# Patient Record
Sex: Female | Born: 1980
Health system: Southern US, Community
[De-identification: ages and names within clinical notes are randomized; demographics above are authoritative.]

## PROBLEM LIST (undated history)

## (undated) DIAGNOSIS — N390 Urinary tract infection, site not specified: Secondary | ICD-10-CM

## (undated) DIAGNOSIS — A749 Chlamydial infection, unspecified: Secondary | ICD-10-CM

## (undated) DIAGNOSIS — B009 Herpesviral infection, unspecified: Secondary | ICD-10-CM

## (undated) DIAGNOSIS — IMO0002 Reserved for concepts with insufficient information to code with codable children: Secondary | ICD-10-CM

## (undated) DIAGNOSIS — B977 Papillomavirus as the cause of diseases classified elsewhere: Secondary | ICD-10-CM

## (undated) DIAGNOSIS — A549 Gonococcal infection, unspecified: Secondary | ICD-10-CM

## (undated) DIAGNOSIS — A599 Trichomoniasis, unspecified: Secondary | ICD-10-CM

## (undated) DIAGNOSIS — R87619 Unspecified abnormal cytological findings in specimens from cervix uteri: Secondary | ICD-10-CM

## (undated) DIAGNOSIS — R51 Headache: Secondary | ICD-10-CM

## (undated) DIAGNOSIS — D649 Anemia, unspecified: Secondary | ICD-10-CM

## (undated) HISTORY — DX: Herpesviral infection, unspecified: B00.9

## (undated) HISTORY — PX: LEEP: SHX91

---

## 2002-02-21 ENCOUNTER — Ambulatory Visit (HOSPITAL_COMMUNITY): Admission: RE | Admit: 2002-02-21 | Discharge: 2002-02-21 | Payer: Self-pay

## 2002-05-04 ENCOUNTER — Encounter: Admission: RE | Admit: 2002-05-04 | Discharge: 2002-05-04 | Payer: Self-pay | Admitting: Obstetrics and Gynecology

## 2002-07-30 ENCOUNTER — Inpatient Hospital Stay (HOSPITAL_COMMUNITY): Admission: AD | Admit: 2002-07-30 | Discharge: 2002-07-31 | Payer: Self-pay | Admitting: *Deleted

## 2003-03-05 ENCOUNTER — Emergency Department (HOSPITAL_COMMUNITY): Admission: EM | Admit: 2003-03-05 | Discharge: 2003-03-05 | Payer: Self-pay | Admitting: Emergency Medicine

## 2003-03-05 ENCOUNTER — Encounter: Payer: Self-pay | Admitting: Emergency Medicine

## 2003-04-26 ENCOUNTER — Encounter: Admission: RE | Admit: 2003-04-26 | Discharge: 2003-04-26 | Payer: Self-pay | Admitting: Obstetrics and Gynecology

## 2003-05-28 ENCOUNTER — Ambulatory Visit (HOSPITAL_COMMUNITY): Admission: RE | Admit: 2003-05-28 | Discharge: 2003-05-28 | Payer: Self-pay | Admitting: Obstetrics and Gynecology

## 2003-05-28 ENCOUNTER — Encounter (INDEPENDENT_AMBULATORY_CARE_PROVIDER_SITE_OTHER): Payer: Self-pay

## 2003-06-28 ENCOUNTER — Encounter: Admission: RE | Admit: 2003-06-28 | Discharge: 2003-06-28 | Payer: Self-pay | Admitting: Obstetrics and Gynecology

## 2003-07-23 ENCOUNTER — Encounter: Admission: RE | Admit: 2003-07-23 | Discharge: 2003-07-23 | Payer: Self-pay | Admitting: Obstetrics and Gynecology

## 2003-10-12 ENCOUNTER — Encounter: Admission: RE | Admit: 2003-10-12 | Discharge: 2003-10-12 | Payer: Self-pay | Admitting: Obstetrics and Gynecology

## 2003-12-31 ENCOUNTER — Encounter: Admission: RE | Admit: 2003-12-31 | Discharge: 2003-12-31 | Payer: Self-pay | Admitting: *Deleted

## 2004-03-18 ENCOUNTER — Encounter: Admission: RE | Admit: 2004-03-18 | Discharge: 2004-03-18 | Payer: Self-pay | Admitting: Obstetrics & Gynecology

## 2004-06-17 ENCOUNTER — Encounter: Admission: RE | Admit: 2004-06-17 | Discharge: 2004-06-17 | Payer: Self-pay | Admitting: Obstetrics and Gynecology

## 2004-09-08 ENCOUNTER — Ambulatory Visit: Payer: Self-pay | Admitting: Obstetrics and Gynecology

## 2004-10-24 ENCOUNTER — Inpatient Hospital Stay (HOSPITAL_COMMUNITY): Admission: AD | Admit: 2004-10-24 | Discharge: 2004-10-24 | Payer: Self-pay | Admitting: Obstetrics & Gynecology

## 2004-12-04 ENCOUNTER — Ambulatory Visit: Payer: Self-pay | Admitting: Family Medicine

## 2004-12-04 ENCOUNTER — Encounter: Payer: Self-pay | Admitting: Family Medicine

## 2004-12-04 ENCOUNTER — Encounter (INDEPENDENT_AMBULATORY_CARE_PROVIDER_SITE_OTHER): Payer: Self-pay | Admitting: *Deleted

## 2004-12-04 ENCOUNTER — Other Ambulatory Visit: Admission: RE | Admit: 2004-12-04 | Discharge: 2004-12-04 | Payer: Self-pay | Admitting: Family Medicine

## 2005-01-11 ENCOUNTER — Emergency Department (HOSPITAL_COMMUNITY): Admission: EM | Admit: 2005-01-11 | Discharge: 2005-01-11 | Payer: Self-pay | Admitting: *Deleted

## 2005-02-17 ENCOUNTER — Ambulatory Visit: Payer: Self-pay | Admitting: Obstetrics and Gynecology

## 2005-03-14 ENCOUNTER — Emergency Department (HOSPITAL_COMMUNITY): Admission: EM | Admit: 2005-03-14 | Discharge: 2005-03-14 | Payer: Self-pay | Admitting: Family Medicine

## 2005-03-27 ENCOUNTER — Inpatient Hospital Stay (HOSPITAL_COMMUNITY): Admission: AD | Admit: 2005-03-27 | Discharge: 2005-03-27 | Payer: Self-pay | Admitting: Obstetrics & Gynecology

## 2005-04-29 ENCOUNTER — Inpatient Hospital Stay (HOSPITAL_COMMUNITY): Admission: AD | Admit: 2005-04-29 | Discharge: 2005-04-29 | Payer: Self-pay | Admitting: Obstetrics and Gynecology

## 2005-05-05 ENCOUNTER — Inpatient Hospital Stay (HOSPITAL_COMMUNITY): Admission: AD | Admit: 2005-05-05 | Discharge: 2005-05-05 | Payer: Self-pay | Admitting: *Deleted

## 2005-05-05 ENCOUNTER — Ambulatory Visit: Payer: Self-pay | Admitting: *Deleted

## 2006-09-01 ENCOUNTER — Emergency Department (HOSPITAL_COMMUNITY): Admission: EM | Admit: 2006-09-01 | Discharge: 2006-09-01 | Payer: Self-pay | Admitting: Emergency Medicine

## 2007-06-17 ENCOUNTER — Emergency Department (HOSPITAL_COMMUNITY): Admission: EM | Admit: 2007-06-17 | Discharge: 2007-06-17 | Payer: Self-pay | Admitting: Emergency Medicine

## 2007-07-06 ENCOUNTER — Emergency Department (HOSPITAL_COMMUNITY): Admission: EM | Admit: 2007-07-06 | Discharge: 2007-07-06 | Payer: Self-pay | Admitting: Emergency Medicine

## 2007-10-29 ENCOUNTER — Inpatient Hospital Stay (HOSPITAL_COMMUNITY): Admission: AD | Admit: 2007-10-29 | Discharge: 2007-10-29 | Payer: Self-pay | Admitting: Gynecology

## 2008-02-07 ENCOUNTER — Emergency Department (HOSPITAL_COMMUNITY): Admission: EM | Admit: 2008-02-07 | Discharge: 2008-02-07 | Payer: Self-pay | Admitting: Family Medicine

## 2008-07-31 ENCOUNTER — Emergency Department (HOSPITAL_COMMUNITY): Admission: EM | Admit: 2008-07-31 | Discharge: 2008-07-31 | Payer: Self-pay | Admitting: Emergency Medicine

## 2008-10-26 HISTORY — PX: COLPOSCOPY: SHX161

## 2008-11-26 ENCOUNTER — Emergency Department (HOSPITAL_COMMUNITY): Admission: EM | Admit: 2008-11-26 | Discharge: 2008-11-26 | Payer: Self-pay | Admitting: Family Medicine

## 2008-11-30 ENCOUNTER — Inpatient Hospital Stay (HOSPITAL_COMMUNITY): Admission: AD | Admit: 2008-11-30 | Discharge: 2008-11-30 | Payer: Self-pay | Admitting: Obstetrics & Gynecology

## 2009-01-18 ENCOUNTER — Emergency Department (HOSPITAL_COMMUNITY): Admission: EM | Admit: 2009-01-18 | Discharge: 2009-01-18 | Payer: Self-pay | Admitting: Emergency Medicine

## 2009-01-20 ENCOUNTER — Emergency Department (HOSPITAL_COMMUNITY): Admission: EM | Admit: 2009-01-20 | Discharge: 2009-01-20 | Payer: Self-pay | Admitting: Emergency Medicine

## 2009-07-10 ENCOUNTER — Emergency Department (HOSPITAL_COMMUNITY): Admission: EM | Admit: 2009-07-10 | Discharge: 2009-07-10 | Payer: Self-pay | Admitting: Family Medicine

## 2010-01-21 ENCOUNTER — Emergency Department (HOSPITAL_COMMUNITY): Admission: EM | Admit: 2010-01-21 | Discharge: 2010-01-21 | Payer: Self-pay | Admitting: Emergency Medicine

## 2010-05-12 ENCOUNTER — Emergency Department (HOSPITAL_COMMUNITY): Admission: EM | Admit: 2010-05-12 | Discharge: 2010-05-12 | Payer: Self-pay | Admitting: Emergency Medicine

## 2010-08-07 ENCOUNTER — Ambulatory Visit: Payer: Self-pay | Admitting: Gynecology

## 2010-08-07 ENCOUNTER — Inpatient Hospital Stay (HOSPITAL_COMMUNITY): Admission: AD | Admit: 2010-08-07 | Discharge: 2010-08-07 | Payer: Self-pay | Admitting: Obstetrics & Gynecology

## 2010-09-04 ENCOUNTER — Emergency Department (HOSPITAL_COMMUNITY): Admission: EM | Admit: 2010-09-04 | Discharge: 2010-09-04 | Payer: Self-pay | Admitting: Emergency Medicine

## 2010-10-04 ENCOUNTER — Emergency Department (HOSPITAL_COMMUNITY)
Admission: EM | Admit: 2010-10-04 | Discharge: 2010-10-04 | Payer: Self-pay | Source: Home / Self Care | Admitting: Emergency Medicine

## 2010-12-11 ENCOUNTER — Encounter: Payer: Self-pay | Admitting: Obstetrics and Gynecology

## 2010-12-18 ENCOUNTER — Encounter: Payer: BC Managed Care – HMO | Admitting: Obstetrics and Gynecology

## 2010-12-18 ENCOUNTER — Other Ambulatory Visit (HOSPITAL_COMMUNITY)
Admission: RE | Admit: 2010-12-18 | Discharge: 2010-12-18 | Disposition: A | Discharge status: Home / Self Care | Payer: BC Managed Care – HMO | Source: Ambulatory Visit | Attending: Obstetrics and Gynecology | Admitting: Obstetrics and Gynecology

## 2010-12-18 ENCOUNTER — Other Ambulatory Visit: Payer: Self-pay | Admitting: Obstetrics and Gynecology

## 2010-12-18 ENCOUNTER — Encounter: Payer: Self-pay | Admitting: Obstetrics and Gynecology

## 2010-12-18 DIAGNOSIS — Z01419 Encounter for gynecological examination (general) (routine) without abnormal findings: Secondary | ICD-10-CM | POA: Insufficient documentation

## 2010-12-18 DIAGNOSIS — N898 Other specified noninflammatory disorders of vagina: Secondary | ICD-10-CM

## 2010-12-18 DIAGNOSIS — R1084 Generalized abdominal pain: Secondary | ICD-10-CM

## 2010-12-18 DIAGNOSIS — R350 Frequency of micturition: Secondary | ICD-10-CM

## 2010-12-18 DIAGNOSIS — Z124 Encounter for screening for malignant neoplasm of cervix: Secondary | ICD-10-CM

## 2010-12-18 LAB — CONVERTED CEMR LAB
Trich, Wet Prep: NONE SEEN
Yeast Wet Prep HPF POC: NONE SEEN

## 2010-12-24 ENCOUNTER — Other Ambulatory Visit: Payer: Self-pay | Admitting: Family Medicine

## 2010-12-31 ENCOUNTER — Ambulatory Visit (HOSPITAL_COMMUNITY): Admission: RE | Admit: 2010-12-31 | Payer: BC Managed Care – HMO | Source: Ambulatory Visit

## 2011-01-06 LAB — URINALYSIS, ROUTINE W REFLEX MICROSCOPIC
Bilirubin Urine: NEGATIVE
Glucose, UA: NEGATIVE mg/dL
Hgb urine dipstick: NEGATIVE
Ketones, ur: NEGATIVE mg/dL
Nitrite: NEGATIVE
Nitrite: NEGATIVE
Protein, ur: NEGATIVE mg/dL
Specific Gravity, Urine: 1.012 (ref 1.005–1.030)
Specific Gravity, Urine: 1.026 (ref 1.005–1.030)
Urobilinogen, UA: 2 mg/dL — ABNORMAL HIGH (ref 0.0–1.0)
Urobilinogen, UA: 2 mg/dL — ABNORMAL HIGH (ref 0.0–1.0)
pH: 7 (ref 5.0–8.0)
pH: 6 (ref 5.0–8.0)

## 2011-01-06 LAB — URINE CULTURE
Colony Count: 15000
Colony Count: >=100000
Culture  Setup Time: 201112102018

## 2011-01-06 LAB — WET PREP, GENITAL
Clue Cells Wet Prep HPF POC: NONE SEEN
Trich, Wet Prep: NONE SEEN
Yeast Wet Prep HPF POC: NONE SEEN

## 2011-01-06 LAB — GC/CHLAMYDIA PROBE AMP, GENITAL
Chlamydia, DNA Probe: NEGATIVE
GC Probe Amp, Genital: NEGATIVE

## 2011-01-06 LAB — URINE MICROSCOPIC-ADD ON

## 2011-01-06 LAB — POCT PREGNANCY, URINE: Preg Test, Ur: NEGATIVE

## 2011-01-07 LAB — URINALYSIS, ROUTINE W REFLEX MICROSCOPIC
Bilirubin Urine: NEGATIVE
Glucose, UA: NEGATIVE mg/dL
Hgb urine dipstick: NEGATIVE
Ketones, ur: NEGATIVE mg/dL
Nitrite: NEGATIVE
Protein, ur: NEGATIVE mg/dL
Specific Gravity, Urine: <1.005 — ABNORMAL LOW (ref 1.005–1.030)
Urobilinogen, UA: 1 mg/dL (ref 0.0–1.0)
pH: 6 (ref 5.0–8.0)

## 2011-01-07 LAB — WET PREP, GENITAL: Yeast Wet Prep HPF POC: NONE SEEN

## 2011-01-07 LAB — GC/CHLAMYDIA PROBE AMP, GENITAL: Chlamydia, DNA Probe: NEGATIVE

## 2011-01-08 ENCOUNTER — Other Ambulatory Visit: Payer: Self-pay | Admitting: Family Medicine

## 2011-01-08 ENCOUNTER — Encounter (INDEPENDENT_AMBULATORY_CARE_PROVIDER_SITE_OTHER): Payer: Self-pay | Admitting: *Deleted

## 2011-01-08 DIAGNOSIS — R1013 Epigastric pain: Secondary | ICD-10-CM

## 2011-01-12 ENCOUNTER — Ambulatory Visit (HOSPITAL_COMMUNITY)
Admission: RE | Admit: 2011-01-12 | Discharge: 2011-01-12 | Disposition: A | Discharge status: Home / Self Care | Payer: BC Managed Care – HMO | Source: Ambulatory Visit | Attending: Family Medicine | Admitting: Family Medicine

## 2011-01-12 DIAGNOSIS — R1013 Epigastric pain: Secondary | ICD-10-CM | POA: Insufficient documentation

## 2011-01-13 NOTE — Letter (Signed)
Summary: New Patient letter  [facility]  [address] [place], [place] [address]   Phone: [phone]  Fax: [phone]       01/08/2011 MRN: [id number]  [facility] [address] [place], [place]  [address]  [place]  Dear Ms. Graven,  Welcome to the Gastroenterology Division at [facility].    You are scheduled to see Dr.  Russella Dar on 02-19-11 at 10:15A.M. on the 3rd floor at [facility], [address].  We ask that you try to arrive at our office 15 minutes prior to your appointment time to allow for check-in.  We would like you to complete the enclosed self-administered evaluation form prior to your visit and bring it with you on the day of your appointment.  We will review it with you.  Also, please bring a complete list of all your medications or, if you prefer, bring the medication bottles and we will list them.  Please bring your insurance card so that we may make a copy of it.  If your insurance requires a referral to see a specialist, please bring your referral form from your primary care physician.  Co-payments are due at the time of your visit and may be paid by cash, check or credit card.     Your office visit will consist of a consult with your physician (includes a physical exam), any laboratory testing he/she may order, scheduling of any necessary diagnostic testing (e.g. x-ray, ultrasound, CT-scan), and scheduling of a procedure (e.g. Endoscopy, Colonoscopy) if required.  Please allow enough time on your schedule to allow for any/all of these possibilities.    If you cannot keep your appointment, please call [phone] to cancel or reschedule prior to your appointment date.  This allows [place] the opportunity to schedule an appointment for another patient in need of care.  If you do not cancel or reschedule by 5 p.m. the business day prior to your appointment date, you will be charged a $50.00 late cancellation/no-show fee.    Thank you for  choosing [facility] for your medical needs.  We appreciate the opportunity to care for you.  Please visit [place] at our website  to learn more about our practice.                     Sincerely,                                                             The Gastroenterology Division

## 2011-01-16 NOTE — Progress Notes (Unsigned)
[name], [name]             ACCOUNT NO.:  [id number]  MEDICAL RECORD NO.:  [id number]           PATIENT TYPE:  A  LOCATION:  [facility]                   FACILITY:  [facility]  PHYSICIAN:  Argentina Donovan, MD        DATE OF BIRTH:  07-Jun-1981  DATE OF SERVICE:  12/18/2010                                 CLINIC NOTE  The patient is a 30 year old African American gravida 2, para 2-0-0-2, who is referred from the emergency room for a repeat Pap smear.  In the past, [facility], and she had a LEEP done several years ago at [facility].  Her chief complaint is midepigastric pain and urinary frequency with false urge and frequent urinary tract infections.  PHYSICAL EXAMINATION:  ABDOMEN:  Soft, flat, somewhat tender in the mid upper portion area with some guarding, but no rebound.  There is no positive Murphy sign, and the pain does radiate into her back.  The lower abdomen is fine, but when I push in the suprapubic area, she says she feel like __________. PELVIC:  The external genitalia was normal.  BUS within normal limits. There is a heavy leukorrhea with a strong amine odor.  Wet prep was taken.  The vagina otherwise is clean and well rugated.  The cervix is quite scarred and flushed against the apex of the vagina.  Pap smear was taken.  The uterus was anterior of normal size, shape, and consistency, and the adnexa is normal.  IMPRESSION: 1. Bacterial vaginosis, with normal pelvic exam. 2. Recurrent urinary tract infections with false urge. 3. Epigastric pain.  I am going to put the patient on Flagyl.  I am going to refer her to gastroenterologist and to a urologist for evaluation.  I am going to get a CT of her upper abdomen with and without contrast.          ______________________________ Argentina Donovan, MD    PR/MEDQ  D:  12/18/2010  T:  12/19/2010  Job:  [id number]

## 2011-01-30 LAB — POCT URINALYSIS DIP (DEVICE)
Glucose, UA: NEGATIVE mg/dL
Nitrite: NEGATIVE
Protein, ur: NEGATIVE mg/dL
Urobilinogen, UA: 4 mg/dL — ABNORMAL HIGH (ref 0.0–1.0)

## 2011-01-30 LAB — POCT PREGNANCY, URINE: Preg Test, Ur: NEGATIVE

## 2011-01-30 LAB — GC/CHLAMYDIA PROBE AMP, GENITAL: GC Probe Amp, Genital: NEGATIVE

## 2011-02-05 LAB — COMPREHENSIVE METABOLIC PANEL
ALT: 13 U/L (ref 0–35)
AST: 19 U/L (ref 0–37)
Calcium: 9 mg/dL (ref 8.4–10.5)
Creatinine, Ser: 0.69 mg/dL (ref 0.4–1.2)
GFR calc Af Amer: >60 mL/min (ref >60)
Sodium: 140 mEq/L (ref 135–145)
Total Protein: 6.8 g/dL (ref 6.0–8.3)

## 2011-02-05 LAB — URINALYSIS, ROUTINE W REFLEX MICROSCOPIC
Glucose, UA: NEGATIVE mg/dL
Hgb urine dipstick: NEGATIVE
Nitrite: NEGATIVE
Protein, ur: NEGATIVE mg/dL
Specific Gravity, Urine: 1.016 (ref 1.005–1.030)
Specific Gravity, Urine: 1.021 (ref 1.005–1.030)
pH: 7.5 (ref 5.0–8.0)
pH: 8 (ref 5.0–8.0)

## 2011-02-05 LAB — URINE MICROSCOPIC-ADD ON

## 2011-02-05 LAB — CBC
Hemoglobin: 11.7 g/dL — ABNORMAL LOW (ref 12.0–15.0)
MCHC: 35 g/dL (ref 30.0–36.0)
MCV: 92.2 fL (ref 78.0–100.0)
RDW: 12.7 % (ref 11.5–15.5)
RDW: 12.9 % (ref 11.5–15.5)

## 2011-02-05 LAB — DIFFERENTIAL
Basophils Absolute: 0 10*3/uL (ref 0.0–0.1)
Eosinophils Absolute: 0.1 10*3/uL (ref 0.0–0.7)
Eosinophils Relative: 1 % (ref 0–5)
Lymphocytes Relative: 14 % (ref 12–46)
Lymphocytes Relative: 16 % (ref 12–46)
Lymphs Abs: 1.2 10*3/uL (ref 0.7–4.0)
Monocytes Absolute: 0.2 10*3/uL (ref 0.1–1.0)
Monocytes Relative: 3 % (ref 3–12)
Neutro Abs: 6 10*3/uL (ref 1.7–7.7)
Neutrophils Relative %: 79 % — ABNORMAL HIGH (ref 43–77)
Neutrophils Relative %: 82 % — ABNORMAL HIGH (ref 43–77)

## 2011-02-05 LAB — GC/CHLAMYDIA PROBE AMP, GENITAL: GC Probe Amp, Genital: POSITIVE — AB

## 2011-02-05 LAB — BASIC METABOLIC PANEL
Calcium: 8.6 mg/dL (ref 8.4–10.5)
GFR calc Af Amer: >60 mL/min (ref >60)
GFR calc non Af Amer: >60 mL/min (ref >60)
Glucose, Bld: 90 mg/dL (ref 70–99)
Sodium: 137 mEq/L (ref 135–145)

## 2011-02-05 LAB — WET PREP, GENITAL: Clue Cells Wet Prep HPF POC: NONE SEEN

## 2011-02-05 LAB — LIPASE, BLOOD: Lipase: 22 U/L (ref 11–59)

## 2011-02-10 LAB — COMPREHENSIVE METABOLIC PANEL
Albumin: 4 g/dL (ref 3.5–5.2)
Alkaline Phosphatase: 49 U/L (ref 39–117)
BUN: 5 mg/dL — ABNORMAL LOW (ref 6–23)
CO2: 27 mEq/L (ref 19–32)
Chloride: 103 mEq/L (ref 96–112)
Creatinine, Ser: 0.76 mg/dL (ref 0.4–1.2)
GFR calc non Af Amer: >60 mL/min (ref >60)
Glucose, Bld: 92 mg/dL (ref 70–99)
Potassium: 3.9 mEq/L (ref 3.5–5.1)
Total Bilirubin: 0.6 mg/dL (ref 0.3–1.2)

## 2011-02-10 LAB — POCT URINALYSIS DIP (DEVICE)
Glucose, UA: NEGATIVE mg/dL
Nitrite: NEGATIVE
Protein, ur: >=300 mg/dL — AB
Specific Gravity, Urine: 1.025 (ref 1.005–1.030)
Urobilinogen, UA: 4 mg/dL — ABNORMAL HIGH (ref 0.0–1.0)

## 2011-02-10 LAB — POCT PREGNANCY, URINE
Preg Test, Ur: NEGATIVE
Preg Test, Ur: POSITIVE

## 2011-02-10 LAB — CBC
HCT: 38.3 % (ref 36.0–46.0)
Hemoglobin: 13 g/dL (ref 12.0–15.0)
MCV: 92 fL (ref 78.0–100.0)
RBC: 4.16 MIL/uL (ref 3.87–5.11)
WBC: 2.9 10*3/uL — ABNORMAL LOW (ref 4.0–10.5)

## 2011-02-10 LAB — WET PREP, GENITAL: Yeast Wet Prep HPF POC: NONE SEEN

## 2011-02-10 LAB — URINE MICROSCOPIC-ADD ON

## 2011-02-10 LAB — URINALYSIS, ROUTINE W REFLEX MICROSCOPIC
Bilirubin Urine: NEGATIVE
Nitrite: NEGATIVE
Specific Gravity, Urine: 1.025 (ref 1.005–1.030)
Urobilinogen, UA: 1 mg/dL (ref 0.0–1.0)
pH: 6 (ref 5.0–8.0)

## 2011-02-10 LAB — HCG, SERUM, QUALITATIVE: Preg, Serum: NEGATIVE

## 2011-02-19 ENCOUNTER — Ambulatory Visit: Payer: BC Managed Care – HMO | Admitting: Gastroenterology

## 2011-03-13 ENCOUNTER — Inpatient Hospital Stay (HOSPITAL_COMMUNITY)
Admission: AD | Admit: 2011-03-13 | Discharge: 2011-03-13 | Disposition: A | Discharge status: Home / Self Care | Payer: BC Managed Care – HMO | Source: Ambulatory Visit | Attending: Family Medicine | Admitting: Family Medicine

## 2011-03-13 ENCOUNTER — Inpatient Hospital Stay (HOSPITAL_COMMUNITY): Payer: BC Managed Care – HMO

## 2011-03-13 DIAGNOSIS — N883 Incompetence of cervix uteri: Secondary | ICD-10-CM

## 2011-03-13 DIAGNOSIS — O99891 Other specified diseases and conditions complicating pregnancy: Secondary | ICD-10-CM

## 2011-03-13 DIAGNOSIS — R109 Unspecified abdominal pain: Secondary | ICD-10-CM

## 2011-03-13 DIAGNOSIS — O9989 Other specified diseases and conditions complicating pregnancy, childbirth and the puerperium: Secondary | ICD-10-CM

## 2011-03-13 LAB — URINALYSIS, ROUTINE W REFLEX MICROSCOPIC
Glucose, UA: 100 mg/dL — AB
Hgb urine dipstick: NEGATIVE
Ketones, ur: 15 mg/dL — AB
Protein, ur: NEGATIVE mg/dL
Urobilinogen, UA: 2 mg/dL — ABNORMAL HIGH (ref 0.0–1.0)

## 2011-03-13 LAB — COMPREHENSIVE METABOLIC PANEL
Alkaline Phosphatase: 41 U/L (ref 39–117)
BUN: 8 mg/dL (ref 6–23)
CO2: 24 mEq/L (ref 19–32)
Chloride: 99 mEq/L (ref 96–112)
Creatinine, Ser: 0.48 mg/dL (ref 0.4–1.2)
GFR calc non Af Amer: >60 mL/min (ref >60)
Glucose, Bld: 113 mg/dL — ABNORMAL HIGH (ref 70–99)
Potassium: 3.8 mEq/L (ref 3.5–5.1)
Total Bilirubin: 0.3 mg/dL (ref 0.3–1.2)

## 2011-03-13 LAB — CBC
MCH: 30.3 pg (ref 26.0–34.0)
MCV: 90.4 fL (ref 78.0–100.0)
Platelets: 194 10*3/uL (ref 150–400)
RBC: 3.43 MIL/uL — ABNORMAL LOW (ref 3.87–5.11)
RDW: 12.3 % (ref 11.5–15.5)

## 2011-03-13 LAB — WET PREP, GENITAL: Yeast Wet Prep HPF POC: NONE SEEN

## 2011-03-13 NOTE — Group Therapy Note (Signed)
   [name], [name]                       ACCOUNT NO.:  [id number]   MEDICAL RECORD NO.:  [id number]                   PATIENT TYPE:  OUT   LOCATION:  [facility]                           FACILITY:  [facility]   PHYSICIAN:  Rosemarie Ax, MD                DATE OF BIRTH:  06/06/1981   DATE OF SERVICE:  06/28/2003                                    CLINIC NOTE   CHIEF COMPLAINT:  Follow up after cold knife conization.   SUBJECTIVE:  A 30 year old African-American female who presents for a  routine follow up after a cold knife conization of her cervix May 28, 2003.  She had had a colposcopy which showed CIN-3 with glandular  involvement and the extent of the lesion could not be visualized so she  underwent a cold knife conization.  She reports that since the procedure she  has been feeling well, no abnormal pain or bleeding.  She is on Depo-Provera  and does not menstruate.  She states that she is having her usual normal  vaginal discharge.   OBJECTIVE:  VITAL SIGNS:  Stable.  GENERAL:  A well-developed, well-nourished African-American female.  PELVIC:  Exam with speculum shows a normal external female genitalia, normal-  appearing vaginal mucosa, moderate white to yellow thin discharge in the  vaginal vault.  Cervix appears consistent with recent cold knife conization  with a small amount of granulation tissue.   PATHOLOGY:  Surgical pathology May 28, 2003 from cold knife conization  reveals severe dysplasia, CIN-3, margins free, and no invasive disease  identified.   ASSESSMENT AND PLAN:  Status post cold knife conization with surgical  pathology as above.  The patient was informed of the results of the  procedure and that her cervix appears to be healing normally.  We discussed  that she will follow up in 4-6 months for a repeat Pap smear then have  another Pap smear six months after that, and if these are both normal she  can return to having annual Pap  smears.                                               Rosemarie Ax, MD    NR/MEDQ  D:  06/28/2003  T:  06/28/2003  Job:  [id number]

## 2011-03-13 NOTE — Group Therapy Note (Signed)
[name], [name]             ACCOUNT NO.:  [id number]   MEDICAL RECORD NO.:  [id number]          PATIENT TYPE:  WOC   LOCATION:  [facility]                   FACILITY:  [facility]   PHYSICIAN:  Tinnie Gens, MD        DATE OF BIRTH:  1981-08-04   DATE OF SERVICE:  12/04/2004                                    CLINIC NOTE   CHIEF COMPLAINT:  Follow-up.   HISTORY OF PRESENT ILLNESS:  The patient is a 30 year old with history of  cold knife cone for CIN-3 whose last Pap smear was in May 2005.  Her cold  knife cone was in August 2004.  Last Pap was normal.  She comes in today  with bleeding after Depo.  Her last Depo was in November.  She usually has  amenorrhea with her Depo.  She is requesting a new birth control method  today.   The patient has been seen in the MAU recently and diagnosed with a bacterial  vaginosis which she thinks may be causing her bleeding.   PHYSICAL EXAMINATION TODAY:  VITAL SIGNS:  As noted in the chart.  GENERAL:  She is a well-developed, well-nourished black female in no acute  distress.  ABDOMEN:  Soft, nontender, nondistended.  GENITOURINARY:  Shows normal external female genitalia.  The vagina is pink  and rugated.  The cervix seems very short as well as having a large probable  endoglandular area that is visible.  There is some blood in the vault.  Wet  prep, Pap smear, and cervical biopsy of the abnormal appearance were taken  without difficulty.  Bleeding was controlled with silver nitrate.   IMPRESSION:  1.  Abnormal bleeding.  2.  History of abnormal Pap with not quite adequate follow-up.   PLAN:  1.  Pap smear today.  2.  Check biopsy results.  3.  Will start her on Nordette one p.o. daily one pack refill p.r.n. for a      year.  4.  The patient will return in 2 weeks for biopsy results.  Also, she will      return with problems with the pill.      TP/MEDQ  D:  12/04/2004  T:  12/04/2004  Job:  [id number]

## 2011-03-13 NOTE — Group Therapy Note (Signed)
   [name], [name]                         ACCOUNT NO.:  [id number]   MEDICAL RECORD NO.:  [id number]                   PATIENT TYPE:  OUT   LOCATION:  [facility]                           FACILITY:  [facility]   PHYSICIAN:  Argentina Donovan, M.D.                   DATE OF BIRTH:  December 13, 1980   DATE OF SERVICE:  04/26/2003                                    CLINIC NOTE   HISTORY OF PRESENT ILLNESS:  A 30 year old black female who [facility].  On evaluating patient's  history she is a para 2-0-0-2 with a baby born eight months ago and during  her pregnancy she was checked for diabetes and HIV.  She had a recent  colposcopy which showed CIN III with glandular involvement and the extent of  the lesion could not be visualized.   PAST MEDICAL HISTORY:  She had a LEEP prior to this baby's delivery in 2002  for CIN III.   SOCIAL HISTORY:  The patient is a nonsmoker.   FAMILY HISTORY:  Diabetes and hypertension.   PAST SURGICAL HISTORY:  Has never had any surgery outside of the LEEP.   We are going to schedule her for a cold knife conization of the cervix.  We  discussed with her the risks of her getting pregnant again after that with  the possibility of premature cervical dilatation, cervical stenosis, and  possible necessity of urgent cesarean section and might end up being in bed  for several months prior to delivery of the baby.   IMPRESSION:  CIN III with glandular involvement, severe cervical dysplasia,  recurrent.  Schedule for cold knife conization of the cervix.                                               Argentina Donovan, M.D.    PR/MEDQ  D:  04/26/2003  T:  04/27/2003  Job:  [id number]

## 2011-03-13 NOTE — Group Therapy Note (Signed)
[name], [name]             ACCOUNT NO.:  [id number]   MEDICAL RECORD NO.:  [id number]          PATIENT TYPE:  WOC   [facility]ATION[facility]  [facility]                   [facility]ACILITY[facility]  [facility]   PHYSICIAN:  Argentina Donovan, MD        DATE OF BIRTH:  [birth year]   DATE OF SERVICE:  02/17/2005                                    CLINIC NOTE   HISTORY:  This is a 30 year old P2-0-0-2 who has been seen here for Depo-  Provera shots in the past which she was on for about a year-and-a-half. Of  note, she had a LEEP done here in 2004 by Dr. Okey Dupre for CIN-2 to 3; also had  HPV; and on December 04, 2004 had a cervical biopsy by Dr. Shawnie Pons due to some  abnormal appearance of her cervix. This came back on pathology as ulcerated  endocervical mucus without malignancy. Her main reason for coming today is  that she would like to get off of oral contraceptives and get back on Depo.  She was on Nordette briefly which was started in February 2006. However, she  has had a lot of trouble remembering to take the pills and also is not happy  with side effects of dizziness and fatigue particularly which she feels is  due to the pills; also has had some nausea. For this reason, she wants to  get back on Depo. She sexually active. Her LMP was December 30, 2004 which was a  normal period. She had a negative home pregnancy test a week ago.   PHYSICAL EXAMINATION:  VITAL SIGNS:  Temperature 99.3, pulse 89, blood  pressure 118/76.  GENERAL:  WN/WD, NAD.  ABDOMEN:  Soft, flat, and nontender; no masses.  PELVIC:  Deferred today on this patient since she is not due for a Pap and  is here just for a Depo shot.   ASSESSMENT:  Good candidate for Depo-Provera.   PLAN:  Urine pregnancy test now. If negative, may have Depo-Provera 150 mg  IM and follow up in 3 months for her next shot. She is advised to take  calcium carbonate, multivitamin, 1500 mg a day of calcium carbonate. She is  also advised to take iron 325 mg one p.o. daily. Both of  these are over-the-  counter, and she states that she will take them. Follow-up will be here in 3  months.      DP/MEDQ  D:  02/17/2005  T:  02/18/2005  Job:  [id number]

## 2011-03-13 NOTE — Group Therapy Note (Signed)
NAMEKENNIDEE, HEYNE                       ACCOUNT NO.:  [id number]   MEDICAL RECORD NO.:  [id number]                   PATIENT TYPE:  OUT   LOCATION:  [facility]                           [facility]ACILITY[facility]   PHYSICIAN:  Elsie Lincoln, MD                   DATE OF BIRTH:  [birth year]   DATE OF SERVICE:  03/18/2004                                    CLINIC NOTE   REASON FOR VISIT:  The patient is a 30 year old female who is here for  scheduled Depo.  The patient had a high-grade Pap and subsequent LEEP in  August 2004.  The margins were free.  However, it did include CIN-3.  The  patient is also here for a repeat Pap.  The patient has no complaints - no  headache, depression, or breakthrough bleeding.  The patient does not take  calcium and she was suggested to do so.  The patient is also in a monogamous  relationship and encouraged to use condoms if she ever chooses to change her  partner.   PHYSICAL EXAMINATION:  Vagina:  Pink, rugae; no blood or discharge.  Cervix:  Well-healed scar from LEEP.   ASSESSMENT AND PLAN:  A 30 year old female for Depo-Provera and Pap smear.   1. Pap smear done.  2. Depo given.  3. Calcium carbonate multivitamin recommended.  The patient is to take 1500     mg a day of the calcium carbonate.  4. The patient is having some leg pains, does not have a primary care     physician.  The patient is going to be referred to the family practice     center to establish a primary care Naviyah Schaffert and also address these leg     pains.  5. Return to clinic in 6 weeks.                                               Elsie Lincoln, MD    KL/MEDQ  D:  03/18/2004  T:  03/18/2004  Job:  [id number]

## 2011-03-13 NOTE — Op Note (Signed)
   NAMEMILLI, [name]                       ACCOUNT NO.:  [id number]   MEDICAL RECORD NO.:  [id number]                   PATIENT TYPE:  AMB   LOCATION:  [facility]                                  [facility]   PHYSICIAN:  Phil D. Okey Dupre, M.D.                  DATE OF BIRTH:  11/09/1980   DATE OF PROCEDURE:  05/28/2003  DATE OF DISCHARGE:                                 OPERATIVE REPORT   PREOPERATIVE DIAGNOSIS:  CIN-III severe dysplasia.   POSTOPERATIVE DIAGNOSIS:  Pending pathology report.   PROCEDURE:  Cold knife conization of the cervix.   SURGEON:  Javier Glazier. Rose, M.D.   ESTIMATED BLOOD LOSS:  Less than 50 mL.   ANESTHESIA:  General anesthesia.   FINDINGS:  The bimanual pelvic examination revealed a uterus with normal  size, shape and consistency, anterior flexed with normal free adnexa.  The  cervix was clean and parous.   DESCRIPTION OF PROCEDURE:  Under satisfactory general anesthesia, the  patient was placed in the dorsal lithotomy position.  The perineum and  vagina were prepped and draped in the usual sterile manner.  Weighted  speculum was placed in the posterior fourchette of the vagina.  Anterior lip  of the cervix grasped with single-tooth tenaculum.  The circular incision  was made on the entire circumference of the cervix approximately 1 cm from  the distal end of the cervical os.  This incision was made to a depth of  proximately 4 mm.  At that point, an angled 11 blade was used to remove a  cervical cone that extended just to the internal cervical os giving the cone  a diameter of approximately 2.5 cm and a depth of approximately 3 cm.  Prior  to the conization, angle sutures of #1 chromic were placed figure-of-eights  in each of the lateral cervical angles.  On this, the entire circumference  of the cervix was run with a continuous running locked #1 chromic catgut  suture on an atraumatic needle.  At the end of the procedure, the uterine  cavity was sounded  to make sure there was a good opening and there was  almost no bleeding at that point.  The cervical specimen was tagged at 12  o'clock and sent for pathological diagnosis.  The patient transferred to the  recovery room in satisfactory condition, tolerating the procedure well.                                               Phil D. Okey Dupre, M.D.    PDR/MEDQ  D:  05/28/2003  T:  05/28/2003  Job:  [id number]

## 2011-03-14 LAB — GC/CHLAMYDIA PROBE AMP, GENITAL
Chlamydia, DNA Probe: NEGATIVE
GC Probe Amp, Genital: NEGATIVE

## 2011-03-18 ENCOUNTER — Inpatient Hospital Stay (HOSPITAL_COMMUNITY)
Admission: AD | Admit: 2011-03-18 | Discharge: 2011-03-18 | Disposition: A | Discharge status: Home / Self Care | Payer: BC Managed Care – HMO | Source: Ambulatory Visit | Attending: Obstetrics and Gynecology | Admitting: Obstetrics and Gynecology

## 2011-03-18 DIAGNOSIS — O210 Mild hyperemesis gravidarum: Secondary | ICD-10-CM

## 2011-03-18 LAB — URINALYSIS, ROUTINE W REFLEX MICROSCOPIC
Ketones, ur: NEGATIVE mg/dL
Nitrite: NEGATIVE
Protein, ur: NEGATIVE mg/dL
pH: 7.5 (ref 5.0–8.0)

## 2011-03-27 ENCOUNTER — Ambulatory Visit (HOSPITAL_COMMUNITY)
Admit: 2011-03-27 | Discharge: 2011-03-27 | Disposition: A | Discharge status: Home / Self Care | Payer: BC Managed Care – HMO | Attending: Family Medicine | Admitting: Family Medicine

## 2011-03-27 DIAGNOSIS — O99330 Smoking (tobacco) complicating pregnancy, unspecified trimester: Secondary | ICD-10-CM | POA: Insufficient documentation

## 2011-03-27 DIAGNOSIS — O3440 Maternal care for other abnormalities of cervix, unspecified trimester: Secondary | ICD-10-CM | POA: Insufficient documentation

## 2011-04-14 ENCOUNTER — Inpatient Hospital Stay (HOSPITAL_COMMUNITY)
Admission: AD | Admit: 2011-04-14 | Discharge: 2011-04-14 | Disposition: A | Discharge status: Home / Self Care | Payer: BC Managed Care – HMO | Source: Ambulatory Visit | Attending: Obstetrics | Admitting: Obstetrics

## 2011-04-14 DIAGNOSIS — O210 Mild hyperemesis gravidarum: Secondary | ICD-10-CM | POA: Insufficient documentation

## 2011-04-14 DIAGNOSIS — O99891 Other specified diseases and conditions complicating pregnancy: Secondary | ICD-10-CM | POA: Insufficient documentation

## 2011-04-14 DIAGNOSIS — G43909 Migraine, unspecified, not intractable, without status migrainosus: Secondary | ICD-10-CM | POA: Insufficient documentation

## 2011-04-14 LAB — URINALYSIS, ROUTINE W REFLEX MICROSCOPIC
Ketones, ur: 40 mg/dL — AB
Specific Gravity, Urine: >1.03 — ABNORMAL HIGH (ref 1.005–1.030)
Urobilinogen, UA: 2 mg/dL — ABNORMAL HIGH (ref 0.0–1.0)

## 2011-04-14 LAB — URINE MICROSCOPIC-ADD ON

## 2011-05-01 ENCOUNTER — Inpatient Hospital Stay (HOSPITAL_COMMUNITY)
Admission: AD | Admit: 2011-05-01 | Discharge: 2011-05-01 | Disposition: A | Discharge status: Home / Self Care | Payer: BC Managed Care – HMO | Source: Ambulatory Visit | Attending: Obstetrics & Gynecology | Admitting: Obstetrics & Gynecology

## 2011-05-01 DIAGNOSIS — R109 Unspecified abdominal pain: Secondary | ICD-10-CM | POA: Insufficient documentation

## 2011-05-01 DIAGNOSIS — O99891 Other specified diseases and conditions complicating pregnancy: Secondary | ICD-10-CM

## 2011-05-01 DIAGNOSIS — O9989 Other specified diseases and conditions complicating pregnancy, childbirth and the puerperium: Secondary | ICD-10-CM

## 2011-05-01 LAB — URINALYSIS, ROUTINE W REFLEX MICROSCOPIC
Glucose, UA: NEGATIVE mg/dL
Leukocytes, UA: NEGATIVE
Protein, ur: NEGATIVE mg/dL
Specific Gravity, Urine: 1.025 (ref 1.005–1.030)
Urobilinogen, UA: 1 mg/dL (ref 0.0–1.0)

## 2011-06-18 ENCOUNTER — Encounter (HOSPITAL_COMMUNITY): Payer: Self-pay | Admitting: *Deleted

## 2011-06-18 ENCOUNTER — Inpatient Hospital Stay (HOSPITAL_COMMUNITY)
Admission: AD | Admit: 2011-06-18 | Discharge: 2011-06-18 | Disposition: A | Discharge status: Home / Self Care | Payer: BC Managed Care – HMO | Source: Ambulatory Visit | Attending: Obstetrics & Gynecology | Admitting: Obstetrics & Gynecology

## 2011-06-18 DIAGNOSIS — M545 Low back pain, unspecified: Secondary | ICD-10-CM | POA: Insufficient documentation

## 2011-06-18 DIAGNOSIS — M79609 Pain in unspecified limb: Secondary | ICD-10-CM | POA: Insufficient documentation

## 2011-06-18 DIAGNOSIS — N949 Unspecified condition associated with female genital organs and menstrual cycle: Secondary | ICD-10-CM

## 2011-06-18 DIAGNOSIS — O99891 Other specified diseases and conditions complicating pregnancy: Secondary | ICD-10-CM | POA: Insufficient documentation

## 2011-06-18 HISTORY — DX: Headache: R51

## 2011-06-18 LAB — URINALYSIS, ROUTINE W REFLEX MICROSCOPIC
Bilirubin Urine: NEGATIVE
Glucose, UA: NEGATIVE mg/dL
Hgb urine dipstick: NEGATIVE
Ketones, ur: NEGATIVE mg/dL
Leukocytes, UA: NEGATIVE
Nitrite: NEGATIVE
Protein, ur: NEGATIVE mg/dL
Specific Gravity, Urine: 1.02 (ref 1.005–1.030)
Urobilinogen, UA: 1 mg/dL (ref 0.0–1.0)
pH: 7 (ref 5.0–8.0)

## 2011-06-18 NOTE — Progress Notes (Signed)
DENIES HSV AND MRSA 

## 2011-06-18 NOTE — Progress Notes (Signed)
Pt reports pain in back and in legs since Monday.

## 2011-06-18 NOTE — ED Provider Notes (Signed)
History   Pt presents today c/o lower back pain and leg pain. She states she stands all day at work and thinks that may be the problem. She denies vag dc, bleeding, fever, or any other problems at this time.  Chief Complaint  Patient presents with  . Back Pain   HPI  OB History    Grav Para Term Preterm Abortions TAB SAB Ect Mult Living   3 2 2       2       Past Medical History  Diagnosis Date  . Headache   . No pertinent past medical history     Past Surgical History  Procedure Date  . Colposcopy 2010  . No past surgeries     No family history on file.  History  Substance Use Topics  . Smoking status: Former Smoker -- 5 years    Types: Cigarettes  . Smokeless tobacco: Not on file  . Alcohol Use: No    Allergies: No Known Allergies  Prescriptions prior to admission  Medication Sig Dispense Refill  . prenatal vitamin w/FE, FA (PRENATAL 1 + 1) 27-1 MG TABS Take 1 tablet by mouth daily.          Review of Systems  Constitutional: Negative for fever.  Cardiovascular: Negative for chest pain.  Gastrointestinal: Negative for nausea, vomiting, abdominal pain, diarrhea and constipation.  Genitourinary: Negative for dysuria, urgency, frequency and hematuria.  Neurological: Negative for dizziness and headaches.  Psychiatric/Behavioral: Negative for depression and suicidal ideas.   Physical Exam   Blood pressure 97/63, pulse 87, temperature 98.6 F (37 C), temperature source Oral, resp. rate 20, height 5\' 6"  (1.676 m), weight 163 lb 2 oz (73.993 kg).  Physical Exam  Constitutional: She is oriented to person, place, and time. She appears well-developed and well-nourished. No distress.  HENT:  Head: Normocephalic and atraumatic.  Eyes: EOM are normal. Pupils are equal, round, and reactive to light.  GI: Soft. She exhibits no distension. There is no tenderness. There is no rebound and no guarding.  Genitourinary: No bleeding around the vagina. No vaginal discharge  found.       Cervix Lg/closed.  Neurological: She is alert and oriented to person, place, and time.  Skin: Skin is warm and dry. She is not diaphoretic.  Psychiatric: She has a normal mood and affect. Her behavior is normal. Judgment and thought content normal.    MAU Course  Procedures  Results for orders placed during the hospital encounter of 06/18/11 (from the past 24 hour(s))  URINALYSIS, ROUTINE W REFLEX MICROSCOPIC     Status: Abnormal   Collection Time   06/18/11  7:00 AM      Component Value Range   Color, Urine YELLOW  YELLOW    Appearance HAZY (*) CLEAR    Specific Gravity, Urine 1.020  1.005 - 1.030    pH 7.0  5.0 - 8.0    Glucose, UA NEGATIVE  NEGATIVE (mg/dL)   Hgb urine dipstick NEGATIVE  NEGATIVE    Bilirubin Urine NEGATIVE  NEGATIVE    Ketones, ur NEGATIVE  NEGATIVE (mg/dL)   Protein, ur NEGATIVE  NEGATIVE (mg/dL)   Urobilinogen, UA 1.0  0.0 - 1.0 (mg/dL)   Nitrite NEGATIVE  NEGATIVE    Leukocytes, UA NEGATIVE  NEGATIVE      Assessment and Plan  Pregnancy: discussed with pt at length. She is having round ligament pain. She has f/u scheduled with Dr. Clearance Coots.  Clinton Gallant. Rice III, DrHSc, MPAS,  PA-C  06/18/2011, 7:02 AM

## 2011-06-18 NOTE — Progress Notes (Signed)
HAAS PAIN ON BOTH SIDES IN LOWER BACK-- BEGAN ON 06-08-2011 THEN ON 06-15-2011- WORSE.  STANDS AT WORK  ON CONCRETE FLOOR AND  BACK  AND LEGS HURT

## 2011-07-15 LAB — URINE MICROSCOPIC-ADD ON

## 2011-07-15 LAB — GC/CHLAMYDIA PROBE AMP, GENITAL
Chlamydia, DNA Probe: NEGATIVE
GC Probe Amp, Genital: NEGATIVE

## 2011-07-15 LAB — CBC
Platelets: 235
RDW: 12.9

## 2011-07-15 LAB — SAMPLE TO BLOOD BANK

## 2011-07-15 LAB — URINALYSIS, ROUTINE W REFLEX MICROSCOPIC
Ketones, ur: 15 — AB
Leukocytes, UA: NEGATIVE
Nitrite: NEGATIVE
Protein, ur: 100 — AB
Urobilinogen, UA: 2 — ABNORMAL HIGH

## 2011-07-15 LAB — WET PREP, GENITAL: Trich, Wet Prep: NONE SEEN

## 2011-07-21 LAB — WET PREP, GENITAL: Trich, Wet Prep: NONE SEEN

## 2011-07-21 LAB — POCT URINALYSIS DIP (DEVICE)
Ketones, ur: NEGATIVE
Operator id: 235561
Protein, ur: NEGATIVE
Specific Gravity, Urine: 1.015

## 2011-07-21 LAB — URINE CULTURE

## 2011-07-21 LAB — GC/CHLAMYDIA PROBE AMP, GENITAL: GC Probe Amp, Genital: NEGATIVE

## 2011-08-03 ENCOUNTER — Inpatient Hospital Stay (HOSPITAL_COMMUNITY)
Admission: AD | Admit: 2011-08-03 | Discharge: 2011-08-03 | Disposition: A | Discharge status: Home / Self Care | Payer: BC Managed Care – HMO | Source: Ambulatory Visit | Attending: Obstetrics & Gynecology | Admitting: Obstetrics & Gynecology

## 2011-08-03 ENCOUNTER — Encounter (HOSPITAL_COMMUNITY): Payer: Self-pay | Admitting: *Deleted

## 2011-08-03 DIAGNOSIS — A549 Gonococcal infection, unspecified: Secondary | ICD-10-CM | POA: Insufficient documentation

## 2011-08-03 DIAGNOSIS — M545 Low back pain, unspecified: Secondary | ICD-10-CM

## 2011-08-03 DIAGNOSIS — O219 Vomiting of pregnancy, unspecified: Secondary | ICD-10-CM

## 2011-08-03 DIAGNOSIS — O212 Late vomiting of pregnancy: Secondary | ICD-10-CM | POA: Insufficient documentation

## 2011-08-03 DIAGNOSIS — A749 Chlamydial infection, unspecified: Secondary | ICD-10-CM | POA: Insufficient documentation

## 2011-08-03 DIAGNOSIS — K5900 Constipation, unspecified: Secondary | ICD-10-CM | POA: Insufficient documentation

## 2011-08-03 DIAGNOSIS — O99891 Other specified diseases and conditions complicating pregnancy: Secondary | ICD-10-CM | POA: Insufficient documentation

## 2011-08-03 HISTORY — DX: Gonococcal infection, unspecified: A54.9

## 2011-08-03 HISTORY — DX: Unspecified abnormal cytological findings in specimens from cervix uteri: R87.619

## 2011-08-03 HISTORY — DX: Chlamydial infection, unspecified: A74.9

## 2011-08-03 HISTORY — DX: Reserved for concepts with insufficient information to code with codable children: IMO0002

## 2011-08-03 HISTORY — DX: Papillomavirus as the cause of diseases classified elsewhere: B97.7

## 2011-08-03 HISTORY — DX: Trichomoniasis, unspecified: A59.9

## 2011-08-03 LAB — URINALYSIS, ROUTINE W REFLEX MICROSCOPIC
Bilirubin Urine: NEGATIVE
Hgb urine dipstick: NEGATIVE
Ketones, ur: NEGATIVE mg/dL
Nitrite: NEGATIVE
Urobilinogen, UA: 4 mg/dL — ABNORMAL HIGH (ref 0.0–1.0)
pH: 6 (ref 5.0–8.0)

## 2011-08-03 LAB — WET PREP, GENITAL
Clue Cells Wet Prep HPF POC: NONE SEEN
Trich, Wet Prep: NONE SEEN

## 2011-08-03 MED ORDER — POLYETHYLENE GLYCOL 3350 17 GM/SCOOP PO POWD: 17.0000 g | Freq: Every day | ORAL | Status: AC

## 2011-08-03 MED ORDER — DOCUSATE SODIUM 100 MG PO CAPS: 100.0000 mg | ORAL_CAPSULE | Freq: Every day | ORAL | Status: DC | PRN

## 2011-08-03 MED ORDER — PROMETHAZINE HCL 25 MG PO TABS: 25.0000 mg | ORAL_TABLET | Freq: Four times a day (QID) | ORAL | Status: AC | PRN

## 2011-08-03 MED ORDER — ONDANSETRON 8 MG PO TBDP
8.0000 mg | ORAL_TABLET | Freq: Once | ORAL | Status: AC
  Administered 2011-08-03: 8 mg via ORAL
  Filled 2011-08-03: qty 1

## 2011-08-03 NOTE — Progress Notes (Signed)
Pt states nauseated, is constipated, vomited this am, no food since Friday, did have fluid this am. Back pain began at 0300 this am, denies bleeding or lof, does have vaginal itching. +FM.

## 2011-08-03 NOTE — ED Provider Notes (Signed)
History     Chief Complaint  Patient presents with  . Back Pain   HPI Low back pain starting about 3 am, intermittent, quick sharp pains about every 30 minutes. Has been having low back pain throughout pregnancy, this is the same pain. No bleeding or contractions. Hasn't been able to keep food down all weekend, tried to eat chicken salad yesterday and vomited. Tolerating water and Juicy-Juice. Constipation - only small BMs x 2 weeks. Has colace at home but isn't taking, "it doesn't work for me". Also c/o vaginal itching x 2 days.   OB History    Grav Para Term Preterm Abortions TAB SAB Ect Mult Living   3 2 2       2       Past Medical History  Diagnosis Date  . Headache   . Abnormal Pap smear   . Gonorrhea   . Chlamydia   . Trichomonas   . HPV (human papilloma virus) infection     Past Surgical History  Procedure Date  . Colposcopy 2010  . Leep     No family history on file.  History  Substance Use Topics  . Smoking status: Former Smoker -- 5 years    Types: Cigarettes  . Smokeless tobacco: Never Used  . Alcohol Use: No    Allergies: No Known Allergies  Prescriptions prior to admission  Medication Sig Dispense Refill  . prenatal vitamin w/FE, FA (PRENATAL 1 + 1) 27-1 MG TABS Take 1 tablet by mouth daily.          Review of Systems  Constitutional: Negative.   Respiratory: Negative.   Cardiovascular: Negative.   Gastrointestinal: Positive for nausea, vomiting and constipation. Negative for abdominal pain and diarrhea.  Genitourinary: Negative for dysuria, urgency, frequency, hematuria and flank pain.       Negative for vaginal bleeding, cramping/contractions  Musculoskeletal: Positive for back pain.  Neurological: Negative.   Psychiatric/Behavioral: Negative.    Physical Exam   Blood pressure 108/62, pulse 88, temperature 97.5 F (36.4 C), temperature source Oral, resp. rate 16, height 5\' 7"  (1.702 m), weight 75.467 kg (166 lb 6 oz).  Physical Exam    Constitutional: She is oriented to person, place, and time. She appears well-developed and well-nourished. No distress.  Cardiovascular: Normal rate.   Respiratory: Effort normal.  GI: Soft. There is no tenderness.  Genitourinary: There is no rash, tenderness or lesion on the right labia. There is no rash, tenderness or lesion on the left labia. Uterus is enlarged (c/w dates). Cervix exhibits no motion tenderness. Vaginal discharge (copius, thin, milky, malodorous ) found.       SVE: closed/thick/high  Musculoskeletal: Normal range of motion.  Neurological: She is alert and oriented to person, place, and time.  Skin: Skin is warm and dry.  Psychiatric: She has a normal mood and affect.    MAU Course  Procedures Results for orders placed during the hospital encounter of 08/03/11 (from the past 24 hour(s))  URINALYSIS, ROUTINE W REFLEX MICROSCOPIC     Status: Abnormal   Collection Time   08/03/11  7:53 AM      Component Value Range   Color, Urine YELLOW  YELLOW    Appearance CLOUDY (*) CLEAR    Specific Gravity, Urine >1.030 (*) 1.005 - 1.030    pH 6.0  5.0 - 8.0    Glucose, UA NEGATIVE  NEGATIVE (mg/dL)   Hgb urine dipstick NEGATIVE  NEGATIVE    Bilirubin Urine NEGATIVE  NEGATIVE    Ketones, ur NEGATIVE  NEGATIVE (mg/dL)   Protein, ur NEGATIVE  NEGATIVE (mg/dL)   Urobilinogen, UA 4.0 (*) 0.0 - 1.0 (mg/dL)   Nitrite NEGATIVE  NEGATIVE    Leukocytes, UA NEGATIVE  NEGATIVE      Assessment and Plan  Low back pain - tylenol PRN, comfort measures, exercises rev'd Nausea and vomiting in pregnancy - tolerating crackers and sprite after Zofran, rx phenergan for home Constipation - take colace as instructed, Miralax PRN Follow up as scheduled  [name] 08/03/2011, 8:25 AM

## 2011-08-04 LAB — GC/CHLAMYDIA PROBE AMP, GENITAL
Chlamydia, DNA Probe: NEGATIVE
GC Probe Amp, Genital: NEGATIVE

## 2011-08-27 ENCOUNTER — Inpatient Hospital Stay (HOSPITAL_COMMUNITY)
Admission: AD | Admit: 2011-08-27 | Discharge: 2011-09-03 | DRG: 766 | Disposition: A | Discharge status: Home / Self Care | Payer: Medicaid Other | Source: Ambulatory Visit | Attending: Obstetrics & Gynecology | Admitting: Obstetrics & Gynecology

## 2011-08-27 ENCOUNTER — Encounter (HOSPITAL_COMMUNITY): Payer: Self-pay | Admitting: *Deleted

## 2011-08-27 ENCOUNTER — Inpatient Hospital Stay (HOSPITAL_COMMUNITY): Payer: Medicaid Other

## 2011-08-27 DIAGNOSIS — IMO0002 Reserved for concepts with insufficient information to code with codable children: Secondary | ICD-10-CM | POA: Clinically undetermined

## 2011-08-27 DIAGNOSIS — O42919 Preterm premature rupture of membranes, unspecified as to length of time between rupture and onset of labor, unspecified trimester: Secondary | ICD-10-CM | POA: Diagnosis present

## 2011-08-27 DIAGNOSIS — O4290 Premature rupture of membranes, unspecified as to length of time between rupture and onset of labor, unspecified weeks of gestation: Secondary | ICD-10-CM

## 2011-08-27 HISTORY — DX: Urinary tract infection, site not specified: N39.0

## 2011-08-27 LAB — CBC
Hemoglobin: 9.3 g/dL — ABNORMAL LOW (ref 12.0–15.0)
MCH: 31.2 pg (ref 26.0–34.0)
MCHC: 33.7 g/dL (ref 30.0–36.0)
MCV: 92.6 fL (ref 78.0–100.0)
RBC: 2.98 MIL/uL — ABNORMAL LOW (ref 3.87–5.11)

## 2011-08-27 LAB — RPR: RPR Ser Ql: NONREACTIVE

## 2011-08-27 MED ORDER — PRENATAL PLUS 27-1 MG PO TABS
1.0000 | ORAL_TABLET | Freq: Every day | ORAL | Status: DC
  Administered 2011-08-28 – 2011-08-29 (×2): 1 via ORAL
  Filled 2011-08-27 (×2): qty 1

## 2011-08-27 MED ORDER — BETAMETHASONE SOD PHOS & ACET 6 (3-3) MG/ML IJ SUSP
12.0000 mg | INTRAMUSCULAR | Status: AC
  Administered 2011-08-27 – 2011-08-28 (×2): 12 mg via INTRAMUSCULAR
  Filled 2011-08-27 (×2): qty 2

## 2011-08-27 MED ORDER — DOCUSATE SODIUM 100 MG PO CAPS
100.0000 mg | ORAL_CAPSULE | Freq: Every day | ORAL | Status: DC
  Administered 2011-08-28 – 2011-08-29 (×2): 100 mg via ORAL
  Filled 2011-08-27 (×2): qty 1

## 2011-08-27 MED ORDER — AMOXICILLIN 500 MG PO CAPS
500.0000 mg | ORAL_CAPSULE | Freq: Three times a day (TID) | ORAL | Status: DC
  Administered 2011-08-29 – 2011-08-30 (×3): 500 mg via ORAL
  Filled 2011-08-27 (×4): qty 1

## 2011-08-27 MED ORDER — ZOLPIDEM TARTRATE 10 MG PO TABS
10.0000 mg | ORAL_TABLET | Freq: Every evening | ORAL | Status: DC | PRN
  Administered 2011-08-27 – 2011-08-28 (×2): 10 mg via ORAL
  Filled 2011-08-27 (×2): qty 1

## 2011-08-27 MED ORDER — AZITHROMYCIN 500 MG PO TABS
500.0000 mg | ORAL_TABLET | Freq: Every day | ORAL | Status: DC
  Administered 2011-08-27 – 2011-08-29 (×3): 500 mg via ORAL
  Filled 2011-08-27 (×2): qty 1
  Filled 2011-08-27 (×2): qty 2

## 2011-08-27 MED ORDER — ACETAMINOPHEN 325 MG PO TABS: 650.0000 mg | ORAL_TABLET | ORAL | Status: DC | PRN

## 2011-08-27 MED ORDER — SODIUM CHLORIDE 0.9 % IV SOLN
2.0000 g | Freq: Four times a day (QID) | INTRAVENOUS | Status: AC
  Administered 2011-08-27 – 2011-08-29 (×8): 2 g via INTRAVENOUS
  Filled 2011-08-27 (×8): qty 2000

## 2011-08-27 MED ORDER — CALCIUM CARBONATE ANTACID 500 MG PO CHEW: 2.0000 | CHEWABLE_TABLET | ORAL | Status: DC | PRN

## 2011-08-27 MED ORDER — LACTATED RINGERS IV SOLN
INTRAVENOUS | Status: DC
  Administered 2011-08-27 – 2011-08-28 (×3): via INTRAVENOUS

## 2011-08-27 NOTE — Progress Notes (Signed)
Patient states she started leaking clear fluid at 0500. Has saturated a pad. No bleeding. Reports good fetal movement, Some mild tightening.

## 2011-08-27 NOTE — Progress Notes (Signed)
Started leaking at 0530, clear, comes out in little gushes.

## 2011-08-27 NOTE — H&P (Signed)
Pt presents today c/o "leaking fluid" since about 5:30am today. She states that she has a "gush" of fluid every time she stands. She denies recent intercourse, vag bleeding, abd pain, or any other problems at this time. She reports GFM.    Chief Complaint   Patient presents with   .  Rupture of Membranes    HPI    OB History      Grav  Para  Term  Preterm  Abortions  TAB  SAB  Ect  Mult  Living     3  2  2              2           Past Medical History   Diagnosis  Date   .  Headache     .  Abnormal Pap smear     .  Gonorrhea     .  Chlamydia     .  Trichomonas     .  HPV (human papilloma virus) infection     .  Urinary tract infection         Past Surgical History   Procedure  Date   .  Colposcopy  2010   .  Leep       No family history on file.    History   Substance Use Topics   .  Smoking status:  Former Smoker -- 5 years       Types:  Cigarettes   .  Smokeless tobacco:  Never Used     Comment: quit with + preg   .  Alcohol Use:  No     Allergies: No Known Allergies    Prescriptions prior to admission   Medication  Sig  Dispense  Refill   .  acetaminophen (TYLENOL) 325 MG tablet  Take 650 mg by mouth every 6 (six) hours as needed. For headache.          .  docusate sodium (COLACE) 100 MG capsule  Take 1 capsule (100 mg total) by mouth daily as needed for constipation.   30 capsule   2   .  prenatal vitamin w/FE, FA (PRENATAL 1 + 1) 27-1 MG TABS  Take 1 tablet by mouth daily.             Review of Systems  Constitutional: Negative for fever.  Cardiovascular: Negative for chest pain.  Gastrointestinal: Negative for nausea, vomiting, abdominal pain, diarrhea and constipation.  Genitourinary: Negative for dysuria, urgency, frequency and hematuria.  Neurological: Negative for dizziness and headaches.  Psychiatric/Behavioral: Negative for depression and suicidal ideas.   Physical Exam    Blood pressure 130/62, pulse 101, temperature 98.4 F (36.9 C),  temperature source Oral, resp. rate 16, height 5' 5.5" (1.664 m), weight 167 lb 12.8 oz (76.114 kg), SpO2 98.00%.  Physical Exam  Nursing note and vitals reviewed. Constitutional: She is oriented to person, place, and time. She appears well-developed and well-nourished. No distress.  HENT:   Head: Normocephalic and atraumatic.  Eyes: EOM are normal. Pupils are equal, round, and reactive to light.  GI: Soft. She exhibits no distension. There is no tenderness. There is no rebound and no guarding.  Genitourinary: No bleeding around the vagina. Vaginal discharge found.      Cervix FT/60/-3. Fetus is vertex. There is pooling of clear fluid in the vault.  Neurological: She is alert and oriented to person, place, and time.  Skin: Skin is warm and dry. She  is not diaphoretic.  Psychiatric: She has a normal mood and affect. Her behavior is normal. Judgment and thought content normal.      MAU Course   Procedures  Fern test positive.       Assessment and Plan   PPROM, IUP @[redacted]w[redacted]d  Not in labor, no overt signs of chorioamnionitis FHT reassuring  Admit Antibiotics Steroids MgSO4 for neuroprotection for labor Bedrest U/S for growth    Clinton Gallant. Rice III, DrHSc, MPAS, PA-C  08/27/2011, 8:14 AM    Henrietta Hoover, PA 08/27/11 1610

## 2011-08-27 NOTE — ED Provider Notes (Signed)
History   Pt presents today c/o "leaking fluid" since about 5:30am today. She states that she has a "gush" of fluid every time she stands. She denies recent intercourse, vag bleeding, abd pain, or any other problems at this time. She reports GFM.  Chief Complaint  Patient presents with  . Rupture of Membranes   HPI  OB History    Grav Para Term Preterm Abortions TAB SAB Ect Mult Living   3 2 2       2       Past Medical History  Diagnosis Date  . Headache   . Abnormal Pap smear   . Gonorrhea   . Chlamydia   . Trichomonas   . HPV (human papilloma virus) infection   . Urinary tract infection     Past Surgical History  Procedure Date  . Colposcopy 2010  . Leep     No family history on file.  History  Substance Use Topics  . Smoking status: Former Smoker -- 5 years    Types: Cigarettes  . Smokeless tobacco: Never Used   Comment: quit with + preg  . Alcohol Use: No    Allergies: No Known Allergies  Prescriptions prior to admission  Medication Sig Dispense Refill  . acetaminophen (TYLENOL) 325 MG tablet Take 650 mg by mouth every 6 (six) hours as needed. For headache.       . docusate sodium (COLACE) 100 MG capsule Take 1 capsule (100 mg total) by mouth daily as needed for constipation.  30 capsule  2  . prenatal vitamin w/FE, FA (PRENATAL 1 + 1) 27-1 MG TABS Take 1 tablet by mouth daily.          Review of Systems  Constitutional: Negative for fever.  Cardiovascular: Negative for chest pain.  Gastrointestinal: Negative for nausea, vomiting, abdominal pain, diarrhea and constipation.  Genitourinary: Negative for dysuria, urgency, frequency and hematuria.  Neurological: Negative for dizziness and headaches.  Psychiatric/Behavioral: Negative for depression and suicidal ideas.   Physical Exam   Blood pressure 130/62, pulse 101, temperature 98.4 F (36.9 C), temperature source Oral, resp. rate 16, height 5' 5.5" (1.664 m), weight 167 lb 12.8 oz (76.114 kg), SpO2  98.00%.  Physical Exam  Nursing note and vitals reviewed. Constitutional: She is oriented to person, place, and time. She appears well-developed and well-nourished. No distress.  HENT:  Head: Normocephalic and atraumatic.  Eyes: EOM are normal. Pupils are equal, round, and reactive to light.  GI: Soft. She exhibits no distension. There is no tenderness. There is no rebound and no guarding.  Genitourinary: No bleeding around the vagina. Vaginal discharge found.       Cervix FT/60/-3. Fetus is vertex. There is pooling of clear fluid in the vault.  Neurological: She is alert and oriented to person, place, and time.  Skin: Skin is warm and dry. She is not diaphoretic.  Psychiatric: She has a normal mood and affect. Her behavior is normal. Judgment and thought content normal.    MAU Course  Procedures  Fern test positive.  Discussed pt with Dr. Tamela Oddi. Will admit.  Assessment and Plan  PPROM: discussed with pt at length. Will admit.  Clinton Gallant. Rice III, DrHSc, MPAS, PA-C  08/27/2011, 8:14 AM   Henrietta Hoover, PA 08/27/11 1610

## 2011-08-28 MED ORDER — SODIUM CHLORIDE 0.9 % IJ SOLN
3.0000 mL | INTRAMUSCULAR | Status: DC | PRN
  Administered 2011-08-28 – 2011-08-29 (×2): 3 mL via INTRAVENOUS

## 2011-08-28 NOTE — Progress Notes (Signed)
UR Chart review completed.  

## 2011-08-28 NOTE — Progress Notes (Signed)
  S: Preterm labor symptoms: None  O: Blood pressure 91/61, pulse 107, temperature 97.8 F (36.6 C), temperature source Oral, resp. rate 18, height 5\' 8"  (1.727 m), weight 75.751 kg (167 lb), SpO2 98.00%.   EAV:WUJWJXBJ: 140 bpm, Variability: Good {> 6 bpm), Accelerations: Reactive and Decelerations: Absent Toco: None YNW:GNFAOZHY: Fingertip Effacement (%): 60 Station: -3 Presentation: Vertex Exam by:: Rice  A/P- 30 y.o. admitted with PPROM Preterm labor management: bedrest advised and continue antbiotics Dating:  [redacted]w[redacted]d  FWB:  FHT reassuring PTL:  See above

## 2011-08-29 NOTE — Progress Notes (Signed)
  S: Preterm labor symptoms: None  O: Blood pressure 110/47, pulse 88, temperature 98.2 F (36.8 C), temperature source Oral, resp. rate 18, height 5\' 8"  (1.727 m), weight 75.751 kg (167 lb), SpO2 98.00%.   ZOX:WRUEAVWU: 140 bpm, Variability: Good {> 6 bpm), Accelerations: Reactive and Decelerations: Absent Toco: None JWJ:XBJYNWGN: Fingertip Effacement (%): 60 Station: -3 Presentation: Vertex Exam by:: Rice  A/P- 30 y.o. admitted with PPROM Preterm labor management: bedrest advised Transfer-->L&D on 11/4 for IOL Dating:  [redacted]w[redacted]d  ROD: spontaneous vaginal

## 2011-08-29 NOTE — Progress Notes (Signed)
RN to the bedside - fetal heart rate not tracing-tracing maternal pulse (90's-100 bpm - verified via pulse ox sensor placed on right middle finger). FHR - 130's.

## 2011-08-30 LAB — CBC
HCT: 27.8 % — ABNORMAL LOW (ref 36.0–46.0)
MCHC: 33.5 g/dL (ref 30.0–36.0)
MCV: 93.9 fL (ref 78.0–100.0)
RDW: 13.1 % (ref 11.5–15.5)

## 2011-08-30 LAB — STREP B DNA PROBE

## 2011-08-30 LAB — CULTURE, BETA STREP (GROUP B ONLY)

## 2011-08-30 MED ORDER — OXYCODONE-ACETAMINOPHEN 5-325 MG PO TABS: 2.0000 | ORAL_TABLET | ORAL | Status: DC | PRN

## 2011-08-30 MED ORDER — PENICILLIN G POTASSIUM 5000000 UNITS IJ SOLR
2.5000 10*6.[IU] | INTRAVENOUS | Status: DC
  Administered 2011-08-30 – 2011-08-31 (×6): 2.5 10*6.[IU] via INTRAVENOUS
  Filled 2011-08-30 (×10): qty 2.5

## 2011-08-30 MED ORDER — PHENYLEPHRINE 40 MCG/ML (10ML) SYRINGE FOR IV PUSH (FOR BLOOD PRESSURE SUPPORT)
80.0000 ug | PREFILLED_SYRINGE | INTRAVENOUS | Status: DC | PRN
  Filled 2011-08-30: qty 5

## 2011-08-30 MED ORDER — LACTATED RINGERS IV SOLN: 500.0000 mL | INTRAVENOUS | Status: DC | PRN

## 2011-08-30 MED ORDER — PENICILLIN G POTASSIUM 5000000 UNITS IJ SOLR
5.0000 10*6.[IU] | Freq: Once | INTRAVENOUS | Status: AC
  Administered 2011-08-30: 5 10*6.[IU] via INTRAVENOUS
  Filled 2011-08-30: qty 5

## 2011-08-30 MED ORDER — LACTATED RINGERS IV SOLN: 500.0000 mL | Freq: Once | INTRAVENOUS | Status: DC

## 2011-08-30 MED ORDER — OXYTOCIN BOLUS FROM INFUSION
500.0000 mL | Freq: Once | INTRAVENOUS | Status: DC
  Filled 2011-08-30: qty 1000
  Filled 2011-08-30: qty 500

## 2011-08-30 MED ORDER — LIDOCAINE HCL (PF) 1 % IJ SOLN: 30.0000 mL | INTRAMUSCULAR | Status: DC | PRN

## 2011-08-30 MED ORDER — IBUPROFEN 600 MG PO TABS: 600.0000 mg | ORAL_TABLET | Freq: Four times a day (QID) | ORAL | Status: DC | PRN

## 2011-08-30 MED ORDER — HYDROXYZINE HCL 50 MG PO TABS: 50.0000 mg | ORAL_TABLET | Freq: Four times a day (QID) | ORAL | Status: DC | PRN

## 2011-08-30 MED ORDER — ONDANSETRON HCL 4 MG/2ML IJ SOLN
4.0000 mg | Freq: Four times a day (QID) | INTRAMUSCULAR | Status: DC | PRN
  Administered 2011-08-30 – 2011-08-31 (×3): 4 mg via INTRAVENOUS
  Filled 2011-08-30 (×3): qty 2

## 2011-08-30 MED ORDER — SODIUM BICARBONATE 8.4 % IV SOLN
INTRAVENOUS | Status: DC | PRN
  Administered 2011-08-30: 5 mL via EPIDURAL

## 2011-08-30 MED ORDER — EPHEDRINE 5 MG/ML INJ
10.0000 mg | INTRAVENOUS | Status: DC | PRN
  Filled 2011-08-30: qty 4

## 2011-08-30 MED ORDER — OXYTOCIN 20 UNITS IN LACTATED RINGERS INFUSION - SIMPLE
125.0000 mL/h | Freq: Once | INTRAVENOUS | Status: DC
  Administered 2011-08-30: 125 mL/h via INTRAVENOUS

## 2011-08-30 MED ORDER — PHENYLEPHRINE 40 MCG/ML (10ML) SYRINGE FOR IV PUSH (FOR BLOOD PRESSURE SUPPORT): 80.0000 ug | PREFILLED_SYRINGE | INTRAVENOUS | Status: DC | PRN

## 2011-08-30 MED ORDER — EPHEDRINE 5 MG/ML INJ: 10.0000 mg | INTRAVENOUS | Status: DC | PRN

## 2011-08-30 MED ORDER — CITRIC ACID-SODIUM CITRATE 334-500 MG/5ML PO SOLN
30.0000 mL | ORAL | Status: DC | PRN
  Administered 2011-08-31: 30 mL via ORAL
  Filled 2011-08-30: qty 15

## 2011-08-30 MED ORDER — FENTANYL 2.5 MCG/ML BUPIVACAINE 1/10 % EPIDURAL INFUSION (WH - ANES)
14.0000 mL/h | INTRAMUSCULAR | Status: DC
  Administered 2011-08-30 – 2011-08-31 (×6): 14 mL/h via EPIDURAL
  Filled 2011-08-30 (×7): qty 60

## 2011-08-30 MED ORDER — FENTANYL 2.5 MCG/ML BUPIVACAINE 1/10 % EPIDURAL INFUSION (WH - ANES)
INTRAMUSCULAR | Status: DC | PRN
  Administered 2011-08-30: 16 mL/h via EPIDURAL

## 2011-08-30 MED ORDER — HYDROXYZINE HCL 50 MG/ML IM SOLN: 50.0000 mg | Freq: Four times a day (QID) | INTRAMUSCULAR | Status: DC | PRN

## 2011-08-30 MED ORDER — ACETAMINOPHEN 325 MG PO TABS
650.0000 mg | ORAL_TABLET | ORAL | Status: DC | PRN
  Administered 2011-08-31: 650 mg via ORAL
  Filled 2011-08-30: qty 2

## 2011-08-30 MED ORDER — TERBUTALINE SULFATE 1 MG/ML IJ SOLN: 0.2500 mg | Freq: Once | INTRAMUSCULAR | Status: AC | PRN

## 2011-08-30 MED ORDER — OXYTOCIN 20 UNITS IN LACTATED RINGERS INFUSION - SIMPLE
1.0000 m[IU]/min | INTRAVENOUS | Status: DC
  Administered 2011-08-30: 2 m[IU]/min via INTRAVENOUS
  Administered 2011-08-31: 18 m[IU]/min via INTRAVENOUS
  Filled 2011-08-30: qty 1000

## 2011-08-30 MED ORDER — BUTORPHANOL TARTRATE 2 MG/ML IJ SOLN: 1.0000 mg | INTRAMUSCULAR | Status: DC | PRN

## 2011-08-30 MED ORDER — DIPHENHYDRAMINE HCL 50 MG/ML IJ SOLN: 12.5000 mg | INTRAMUSCULAR | Status: DC | PRN

## 2011-08-30 MED ORDER — LACTATED RINGERS IV SOLN
INTRAVENOUS | Status: DC
  Administered 2011-08-30: 500 mL/h via INTRAVENOUS
  Administered 2011-08-30: 10:00:00 via INTRAVENOUS
  Administered 2011-08-30: 125 mL/h via INTRAVENOUS
  Administered 2011-08-31 (×2): via INTRAVENOUS

## 2011-08-30 MED ORDER — ZOLPIDEM TARTRATE 10 MG PO TABS: 10.0000 mg | ORAL_TABLET | Freq: Every evening | ORAL | Status: DC | PRN

## 2011-08-30 NOTE — Progress Notes (Signed)
[name] is a 30 y.o. G3P2002 at [redacted]w[redacted]d by LMP admitted for rupture of membranes  Subjective:   Objective: BP 129/58  Pulse 108  Temp(Src) 98.9 F (37.2 C) (Oral)  Resp 20  Ht 5\' 8"  (1.727 m)  Wt 75.751 kg (167 lb)  BMI 25.39 kg/m2  SpO2 98%      FHT:  FHR: 140 bpm, variability: moderate,  accelerations:  Present,  decelerations:  Absent UC:   none SVE:   Dilation: 1 Effacement (%): 80 Station: -2 Exam by:: jackson-moore  Labs: Lab Results  Component Value Date   WBC 10.6* 08/30/2011   HGB 9.3* 08/30/2011   HCT 27.8* 08/30/2011   MCV 93.9 08/30/2011   PLT 165 08/30/2011    Assessment / Plan: IOL secondary to PPROM @ 34 weeks  Labor:  Low dose Pitocin per protocol Fetal Wellbeing:  Category I Pain Control:  Epidural in active labor I/D:  No obvious signs of infection Anticipated MOD:  NSVD  JACKSON-MOORE,Colburn Asper A 08/30/2011, 11:15 AM

## 2011-08-30 NOTE — Progress Notes (Signed)
loose

## 2011-08-30 NOTE — Progress Notes (Signed)
Call Dr. Jean Rosenthal for clarification on Pitocin order - "How often should I increase Pitocin?" Directed to follow unit protocol.  Pitocin to be increased "no more often than every 30 minutes until adequate contraction pattern is reached".

## 2011-08-30 NOTE — Progress Notes (Addendum)
Will cont with POC

## 2011-08-30 NOTE — Anesthesia Procedure Notes (Signed)

## 2011-08-30 NOTE — Progress Notes (Addendum)
Provider phoned. Made aware of pt status. Pitocin @ 20 milliunits/min, SVE 1cm, uterine contraction pattern, and FHT. New orders given: stop Pitocin. Restart in two hours. Restart with same perimeters as previous order.

## 2011-08-30 NOTE — Anesthesia Preprocedure Evaluation (Signed)
Anesthesia Evaluation  Patient identified by MRN, date of birth, ID band Patient awake    Reviewed: Allergy & Precautions, H&P , Patient's Chart, lab work & pertinent test results  Airway Mallampati: II TM Distance: >3 FB Neck ROM: full    Dental  (+) Teeth Intact   Pulmonary  clear to auscultation        Cardiovascular regular Normal    Neuro/Psych    GI/Hepatic   Endo/Other    Renal/GU      Musculoskeletal   Abdominal   Peds  Hematology   Anesthesia Other Findings       Reproductive/Obstetrics (+) Pregnancy                           Anesthesia Physical Anesthesia Plan  ASA: II  Anesthesia Plan: Epidural   Post-op Pain Management:    Induction:   Airway Management Planned:   Additional Equipment:   Intra-op Plan:   Post-operative Plan:   Informed Consent: I have reviewed the patients History and Physical, chart, labs and discussed the procedure including the risks, benefits and alternatives for the proposed anesthesia with the patient or authorized representative who has indicated his/her understanding and acceptance.   Dental Advisory Given  Plan Discussed with:   Anesthesia Plan Comments: (Labs checked- platelets confirmed with RN in room. Fetal heart tracing, per RN, reported to be stable enough for sitting procedure. Discussed epidural, and patient consents to the procedure:  included risk of possible headache,backache, failed block, allergic reaction, and nerve injury. This patient was asked if she had any questions or concerns before the procedure started. )        Anesthesia Quick Evaluation  

## 2011-08-30 NOTE — Progress Notes (Signed)
Pt. From sitting up for epidural placement to low SF position (completion of epidural placement).

## 2011-08-30 NOTE — Progress Notes (Signed)
Pitocin not increased due to patient's request - "let's wait until I get the epidural".

## 2011-08-30 NOTE — Progress Notes (Signed)
Daylight savings time ended; time change at 0200.

## 2011-08-31 ENCOUNTER — Encounter (HOSPITAL_COMMUNITY): Payer: Self-pay | Admitting: Anesthesiology

## 2011-08-31 ENCOUNTER — Other Ambulatory Visit: Payer: Self-pay | Admitting: Obstetrics & Gynecology

## 2011-08-31 ENCOUNTER — Encounter (HOSPITAL_COMMUNITY)
Admission: AD | Disposition: A | Discharge status: Home / Self Care | Payer: Self-pay | Source: Ambulatory Visit | Attending: Obstetrics & Gynecology

## 2011-08-31 ENCOUNTER — Inpatient Hospital Stay (HOSPITAL_COMMUNITY): Payer: Medicaid Other | Admitting: Anesthesiology

## 2011-08-31 LAB — BASIC METABOLIC PANEL
BUN: 5 mg/dL — ABNORMAL LOW (ref 6–23)
Calcium: 8.7 mg/dL (ref 8.4–10.5)
Creatinine, Ser: 0.56 mg/dL (ref 0.50–1.10)
GFR calc non Af Amer: >90 mL/min (ref >90)
Glucose, Bld: 81 mg/dL (ref 70–99)
Potassium: 3.7 mEq/L (ref 3.5–5.1)

## 2011-08-31 SURGERY — Surgical Case
Anesthesia: Epidural | Site: Abdomen | Wound class: Clean Contaminated

## 2011-08-31 MED ORDER — MORPHINE SULFATE 0.5 MG/ML IJ SOLN
INTRAMUSCULAR | Status: AC
  Filled 2011-08-31: qty 10

## 2011-08-31 MED ORDER — LACTATED RINGERS IV SOLN
INTRAVENOUS | Status: DC
  Administered 2011-09-01 (×2): via INTRAVENOUS

## 2011-08-31 MED ORDER — IBUPROFEN 600 MG PO TABS
600.0000 mg | ORAL_TABLET | Freq: Four times a day (QID) | ORAL | Status: DC
  Administered 2011-09-01 – 2011-09-03 (×6): 600 mg via ORAL
  Filled 2011-08-31 (×3): qty 1

## 2011-08-31 MED ORDER — OXYTOCIN 20 UNITS IN LACTATED RINGERS INFUSION - SIMPLE: 1.0000 m[IU]/min | INTRAVENOUS | Status: DC

## 2011-08-31 MED ORDER — KETOROLAC TROMETHAMINE 30 MG/ML IJ SOLN
INTRAMUSCULAR | Status: AC
  Administered 2011-08-31: 30 mg via INTRAMUSCULAR
  Filled 2011-08-31: qty 1

## 2011-08-31 MED ORDER — NALOXONE HCL 0.4 MG/ML IJ SOLN: 1.0000 ug/kg/h | INTRAMUSCULAR | Status: DC | PRN

## 2011-08-31 MED ORDER — DIPHENHYDRAMINE HCL 25 MG PO CAPS: 25.0000 mg | ORAL_CAPSULE | Freq: Four times a day (QID) | ORAL | Status: DC | PRN

## 2011-08-31 MED ORDER — MEDROXYPROGESTERONE ACETATE 150 MG/ML IM SUSP
150.0000 mg | INTRAMUSCULAR | Status: AC | PRN
  Administered 2011-09-03: 150 mg via INTRAMUSCULAR
  Filled 2011-08-31: qty 1

## 2011-08-31 MED ORDER — ONDANSETRON HCL 4 MG/2ML IJ SOLN: 4.0000 mg | Freq: Three times a day (TID) | INTRAMUSCULAR | Status: DC | PRN

## 2011-08-31 MED ORDER — OXYTOCIN 10 UNIT/ML IJ SOLN
INTRAMUSCULAR | Status: AC
  Filled 2011-08-31: qty 4

## 2011-08-31 MED ORDER — TETANUS-DIPHTH-ACELL PERTUSSIS 5-2.5-18.5 LF-MCG/0.5 IM SUSP
0.5000 mL | Freq: Once | INTRAMUSCULAR | Status: DC
  Filled 2011-08-31: qty 0.5

## 2011-08-31 MED ORDER — MEPERIDINE HCL 25 MG/ML IJ SOLN: 6.2500 mg | INTRAMUSCULAR | Status: DC | PRN

## 2011-08-31 MED ORDER — ONDANSETRON HCL 4 MG PO TABS: 4.0000 mg | ORAL_TABLET | ORAL | Status: DC | PRN

## 2011-08-31 MED ORDER — LACTATED RINGERS IV SOLN
INTRAVENOUS | Status: DC | PRN
  Administered 2011-08-31 (×3): via INTRAVENOUS

## 2011-08-31 MED ORDER — LIDOCAINE-EPINEPHRINE (PF) 2 %-1:200000 IJ SOLN
INTRAMUSCULAR | Status: AC
  Filled 2011-08-31: qty 20

## 2011-08-31 MED ORDER — CEFAZOLIN SODIUM-DEXTROSE 2-3 GM-% IV SOLR
2.0000 g | INTRAVENOUS | Status: DC
  Filled 2011-08-31: qty 50

## 2011-08-31 MED ORDER — ZOLPIDEM TARTRATE 5 MG PO TABS
5.0000 mg | ORAL_TABLET | Freq: Every evening | ORAL | Status: DC | PRN
  Administered 2011-09-01: 5 mg via ORAL
  Filled 2011-08-31: qty 1

## 2011-08-31 MED ORDER — OXYTOCIN 20 UNITS IN LACTATED RINGERS INFUSION - SIMPLE
INTRAVENOUS | Status: DC | PRN
  Administered 2011-08-31: 20 [IU] via INTRAVENOUS

## 2011-08-31 MED ORDER — NALBUPHINE HCL 10 MG/ML IJ SOLN: 5.0000 mg | INTRAMUSCULAR | Status: DC | PRN

## 2011-08-31 MED ORDER — KETOROLAC TROMETHAMINE 30 MG/ML IJ SOLN: 30.0000 mg | Freq: Four times a day (QID) | INTRAMUSCULAR | Status: AC | PRN

## 2011-08-31 MED ORDER — DIBUCAINE 1 % RE OINT: 1.0000 "application " | TOPICAL_OINTMENT | RECTAL | Status: DC | PRN

## 2011-08-31 MED ORDER — MEASLES, MUMPS & RUBELLA VAC ~~LOC~~ INJ: 0.5000 mL | INJECTION | Freq: Once | SUBCUTANEOUS | Status: DC

## 2011-08-31 MED ORDER — METOCLOPRAMIDE HCL 5 MG/ML IJ SOLN: 10.0000 mg | Freq: Once | INTRAMUSCULAR | Status: DC | PRN

## 2011-08-31 MED ORDER — IBUPROFEN 600 MG PO TABS
600.0000 mg | ORAL_TABLET | Freq: Four times a day (QID) | ORAL | Status: DC | PRN
  Administered 2011-09-01 – 2011-09-02 (×4): 600 mg via ORAL
  Filled 2011-08-31 (×7): qty 1

## 2011-08-31 MED ORDER — NALOXONE HCL 0.4 MG/ML IJ SOLN: 0.4000 mg | INTRAMUSCULAR | Status: DC | PRN

## 2011-08-31 MED ORDER — PHENYLEPHRINE 40 MCG/ML (10ML) SYRINGE FOR IV PUSH (FOR BLOOD PRESSURE SUPPORT)
PREFILLED_SYRINGE | INTRAVENOUS | Status: AC
  Filled 2011-08-31: qty 5

## 2011-08-31 MED ORDER — OXYCODONE-ACETAMINOPHEN 5-325 MG PO TABS
1.0000 | ORAL_TABLET | ORAL | Status: DC | PRN
  Administered 2011-09-01: 1 via ORAL
  Filled 2011-08-31: qty 1

## 2011-08-31 MED ORDER — FENTANYL CITRATE 0.05 MG/ML IJ SOLN: 25.0000 ug | INTRAMUSCULAR | Status: DC | PRN

## 2011-08-31 MED ORDER — SIMETHICONE 80 MG PO CHEW: 80.0000 mg | CHEWABLE_TABLET | ORAL | Status: DC | PRN

## 2011-08-31 MED ORDER — SODIUM BICARBONATE 8.4 % IV SOLN
INTRAVENOUS | Status: AC
  Filled 2011-08-31: qty 50

## 2011-08-31 MED ORDER — DIPHENHYDRAMINE HCL 50 MG/ML IJ SOLN: 25.0000 mg | INTRAMUSCULAR | Status: DC | PRN

## 2011-08-31 MED ORDER — PRENATAL PLUS 27-1 MG PO TABS
1.0000 | ORAL_TABLET | Freq: Every day | ORAL | Status: DC
  Administered 2011-09-01 – 2011-09-03 (×4): 1 via ORAL
  Filled 2011-08-31 (×3): qty 1

## 2011-08-31 MED ORDER — SENNOSIDES-DOCUSATE SODIUM 8.6-50 MG PO TABS
2.0000 | ORAL_TABLET | Freq: Every day | ORAL | Status: DC
  Administered 2011-08-31 – 2011-09-02 (×3): 2 via ORAL

## 2011-08-31 MED ORDER — ONDANSETRON HCL 4 MG/2ML IJ SOLN
INTRAMUSCULAR | Status: AC
  Filled 2011-08-31: qty 2

## 2011-08-31 MED ORDER — MAGNESIUM HYDROXIDE 400 MG/5ML PO SUSP: 30.0000 mL | ORAL | Status: DC | PRN

## 2011-08-31 MED ORDER — MORPHINE SULFATE (PF) 0.5 MG/ML IJ SOLN
INTRAMUSCULAR | Status: DC | PRN
  Administered 2011-08-31: 1 mg via INTRAVENOUS
  Administered 2011-08-31: 4 mg via EPIDURAL

## 2011-08-31 MED ORDER — KETOROLAC TROMETHAMINE 30 MG/ML IJ SOLN
30.0000 mg | Freq: Four times a day (QID) | INTRAMUSCULAR | Status: AC | PRN
  Administered 2011-08-31: 30 mg via INTRAMUSCULAR

## 2011-08-31 MED ORDER — DIPHENHYDRAMINE HCL 25 MG PO CAPS: 25.0000 mg | ORAL_CAPSULE | ORAL | Status: DC | PRN

## 2011-08-31 MED ORDER — FERROUS SULFATE 325 (65 FE) MG PO TABS
325.0000 mg | ORAL_TABLET | Freq: Two times a day (BID) | ORAL | Status: DC
  Administered 2011-09-01 – 2011-09-03 (×4): 325 mg via ORAL
  Filled 2011-08-31 (×4): qty 1

## 2011-08-31 MED ORDER — CEFAZOLIN SODIUM 1-5 GM-% IV SOLN
INTRAVENOUS | Status: DC | PRN
  Administered 2011-08-31: 2 g via INTRAVENOUS

## 2011-08-31 MED ORDER — SODIUM CHLORIDE 0.9 % IJ SOLN: 3.0000 mL | INTRAMUSCULAR | Status: DC | PRN

## 2011-08-31 MED ORDER — BUPIVACAINE HCL (PF) 0.25 % IJ SOLN: INTRAMUSCULAR | Status: DC | PRN

## 2011-08-31 MED ORDER — WITCH HAZEL-GLYCERIN EX PADS: 1.0000 "application " | MEDICATED_PAD | CUTANEOUS | Status: DC | PRN

## 2011-08-31 MED ORDER — EPHEDRINE 5 MG/ML INJ
INTRAVENOUS | Status: AC
  Filled 2011-08-31: qty 10

## 2011-08-31 MED ORDER — SODIUM CHLORIDE 0.9 % IR SOLN
Status: DC | PRN
  Administered 2011-08-31: 1000 mL

## 2011-08-31 MED ORDER — DIPHENHYDRAMINE HCL 50 MG/ML IJ SOLN
12.5000 mg | INTRAMUSCULAR | Status: DC | PRN
Start: 2011-08-31 — End: 2011-09-03

## 2011-08-31 MED ORDER — SCOPOLAMINE 1 MG/3DAYS TD PT72
MEDICATED_PATCH | TRANSDERMAL | Status: AC
  Administered 2011-08-31: 1.5 mg
  Filled 2011-08-31: qty 1

## 2011-08-31 MED ORDER — EPHEDRINE SULFATE 50 MG/ML IJ SOLN
INTRAMUSCULAR | Status: DC | PRN
  Administered 2011-08-31: 10 mg via INTRAVENOUS
  Administered 2011-08-31: 5 mg via INTRAVENOUS

## 2011-08-31 MED ORDER — METOCLOPRAMIDE HCL 5 MG/ML IJ SOLN
INTRAMUSCULAR | Status: DC | PRN
  Administered 2011-08-31: 10 mg via INTRAVENOUS

## 2011-08-31 MED ORDER — PHENYLEPHRINE HCL 10 MG/ML IJ SOLN
INTRAMUSCULAR | Status: DC | PRN
  Administered 2011-08-31: 40 ug via INTRAVENOUS
  Administered 2011-08-31: 80 ug via INTRAVENOUS
  Administered 2011-08-31: 120 ug via INTRAVENOUS
  Administered 2011-08-31 (×6): 40 ug via INTRAVENOUS
  Administered 2011-08-31: 80 ug via INTRAVENOUS

## 2011-08-31 MED ORDER — OXYTOCIN 20 UNITS IN LACTATED RINGERS INFUSION - SIMPLE
125.0000 mL/h | INTRAVENOUS | Status: AC
  Filled 2011-08-31: qty 1000

## 2011-08-31 MED ORDER — LANOLIN HYDROUS EX OINT: 1.0000 "application " | TOPICAL_OINTMENT | CUTANEOUS | Status: DC | PRN

## 2011-08-31 MED ORDER — ONDANSETRON HCL 4 MG/2ML IJ SOLN
INTRAMUSCULAR | Status: DC | PRN
  Administered 2011-08-31: 4 mg via INTRAVENOUS

## 2011-08-31 MED ORDER — ONDANSETRON HCL 4 MG/2ML IJ SOLN: 4.0000 mg | INTRAMUSCULAR | Status: DC | PRN

## 2011-08-31 SURGICAL SUPPLY — 45 items
ADH SKN CLS APL DERMABOND .7 (GAUZE/BANDAGES/DRESSINGS) ×1
APL SKNCLS STERI-STRIP NONHPOA (GAUZE/BANDAGES/DRESSINGS) ×1
BENZOIN TINCTURE PRP APPL 2/3 (GAUZE/BANDAGES/DRESSINGS) ×1 IMPLANT
CANISTER WOUND CARE 500ML ATS (WOUND CARE) IMPLANT
CHLORAPREP W/TINT 26ML (MISCELLANEOUS) ×2 IMPLANT
CLOTH BEACON ORANGE TIMEOUT ST (SAFETY) ×2 IMPLANT
CONTAINER PREFILL 10% NBF 15ML (MISCELLANEOUS) IMPLANT
DERMABOND ADVANCED (GAUZE/BANDAGES/DRESSINGS) ×1
DERMABOND ADVANCED .7 DNX12 (GAUZE/BANDAGES/DRESSINGS) ×1 IMPLANT
DRESSING TELFA 8X3 (GAUZE/BANDAGES/DRESSINGS) ×2 IMPLANT
DRSG VAC ATS LRG SENSATRAC (GAUZE/BANDAGES/DRESSINGS) IMPLANT
DRSG VAC ATS MED SENSATRAC (GAUZE/BANDAGES/DRESSINGS) IMPLANT
DRSG VAC ATS SM SENSATRAC (GAUZE/BANDAGES/DRESSINGS) IMPLANT
ELECT REM PT RETURN 9FT ADLT (ELECTROSURGICAL) ×2
ELECTRODE REM PT RTRN 9FT ADLT (ELECTROSURGICAL) ×1 IMPLANT
EXTRACTOR VACUUM M CUP 4 TUBE (SUCTIONS) IMPLANT
GAUZE SPONGE 4X4 12PLY STRL LF (GAUZE/BANDAGES/DRESSINGS) ×3 IMPLANT
GLOVE BIO SURGEON STRL SZ 6.5 (GLOVE) ×4 IMPLANT
GOWN PREVENTION PLUS LG XLONG (DISPOSABLE) ×6 IMPLANT
KIT ABG SYR 3ML LUER SLIP (SYRINGE) ×1 IMPLANT
NDL HYPO 25X5/8 SAFETYGLIDE (NEEDLE) ×1 IMPLANT
NEEDLE HYPO 25X5/8 SAFETYGLIDE (NEEDLE) ×2 IMPLANT
NS IRRIG 1000ML POUR BTL (IV SOLUTION) ×3 IMPLANT
PACK C SECTION WH (CUSTOM PROCEDURE TRAY) ×2 IMPLANT
PAD ABD 7.5X8 STRL (GAUZE/BANDAGES/DRESSINGS) ×2 IMPLANT
RTRCTR C-SECT PINK 25CM LRG (MISCELLANEOUS) IMPLANT
RTRCTR C-SECT PINK 34CM XLRG (MISCELLANEOUS) IMPLANT
SLEEVE SCD COMPRESS KNEE MED (MISCELLANEOUS) ×1 IMPLANT
STAPLER VISISTAT 35W (STAPLE) IMPLANT
STRIP CLOSURE SKIN 1/2X4 (GAUZE/BANDAGES/DRESSINGS) ×1 IMPLANT
SUT MNCRL 0 VIOLET CTX 36 (SUTURE) ×2 IMPLANT
SUT MNCRL AB 3-0 PS2 27 (SUTURE) ×1 IMPLANT
SUT MONOCRYL 0 CTX 36 (SUTURE) ×2
SUT PDS AB 0 CTX 36 PDP370T (SUTURE) ×1 IMPLANT
SUT PLAIN 0 NONE (SUTURE) IMPLANT
SUT VIC AB 0 CT1 27 (SUTURE) ×4
SUT VIC AB 0 CT1 27XBRD ANBCTR (SUTURE) IMPLANT
SUT VIC AB 2-0 CT1 (SUTURE) IMPLANT
SUT VIC AB 2-0 CT1 27 (SUTURE) ×4
SUT VIC AB 2-0 CT1 TAPERPNT 27 (SUTURE) ×1 IMPLANT
SUT VIC AB 2-0 SH 27 (SUTURE)
SUT VIC AB 2-0 SH 27XBRD (SUTURE) IMPLANT
TOWEL OR 17X24 6PK STRL BLUE (TOWEL DISPOSABLE) ×4 IMPLANT
TRAY FOLEY CATH 14FR (SET/KITS/TRAYS/PACK) ×1 IMPLANT
WATER STERILE IRR 1000ML POUR (IV SOLUTION) ×1 IMPLANT

## 2011-08-31 NOTE — Anesthesia Postprocedure Evaluation (Signed)
  Anesthesia Post-op Note  Patient: [name]  Procedure(s) Performed:  CESAREAN SECTION - Primary Cesarean section Baby Boy @ [phone]  Patient Location: PACU  Anesthesia Type: Epidural  Level of Consciousness: awake, alert  and oriented  Airway and Oxygen Therapy: Patient Spontanous Breathing  Post-op Pain: none  Post-op Assessment: Post-op Vital signs reviewed, Patient's Cardiovascular Status Stable, Respiratory Function Stable, Patent Airway and No signs of Nausea or vomiting  Post-op Vital Signs: Reviewed and stable  Complications: No apparent anesthesia complications

## 2011-08-31 NOTE — Progress Notes (Signed)
[name] is Perry 30 y.o. G3P2002 at [redacted]w[redacted]d by LMP admitted for rupture of membranes  Subjective: Comfortable  Objective: BP 111/56  Pulse 98  Temp(Src) 98.4 F (36.9 C) (Oral)  Resp 18  Ht 5\' 8"  (1.727 m)  Wt 75.751 kg (167 lb)  BMI 25.39 kg/m2  SpO2 99% I/O last 3 completed shifts: In: -  Out: 150 [Urine:150] Total I/O In: 400 [P.O.:400] Out: 600 [Urine:600]  FHT:  FHR: 140 bpm, variability: moderate,  accelerations:  Present,  decelerations:  Absent UC:   regular, every 2 minutes SVE:   Dilation: 2.5 Effacement (%): 90 Station: -2 Exam by:: [employer]: Lab Results  Component Value Date   WBC 10.6* 08/30/2011   HGB 9.3* 08/30/2011   HCT 27.8* 08/30/2011   MCV 93.9 08/30/2011   PLT 165 08/30/2011    Assessment / Plan: Protracted latent phase; scarring from prior conizations  Labor: monitor progress Preeclampsia:  N/Perry Fetal Wellbeing:  Category I Pain Control:  Epidural I/D:  no overt signs of infection Anticipated MOD:  NSVD  JACKSON-MOORE,[name] 08/31/2011, 5:18 AM

## 2011-08-31 NOTE — Transfer of Care (Signed)
Immediate Anesthesia Transfer of Care Note  Patient: [name]  Procedure(s) Performed:  CESAREAN SECTION - Primary Cesarean section Baby Boy @ [phone]  Patient Location: PACU  Anesthesia Type: Epidural  Level of Consciousness: awake, alert  and oriented  Airway & Oxygen Therapy: Patient Spontanous Breathing  Post-op Assessment: Report given to PACU RN and Post -op Vital signs reviewed and stable  Post vital signs: Reviewed and stable  Complications: No apparent anesthesia complications

## 2011-08-31 NOTE — Progress Notes (Signed)
DESANI SPRUNG is a 30 y.o. G3P2002 at [redacted]w[redacted]d by LMP admitted for rupture of membranes  Subjective: Comfortable  Objective: BP 129/54  Pulse 113  Temp(Src) 98.9 F (37.2 C) (Oral)  Resp 20  Ht 5\' 8"  (1.727 m)  Wt 75.751 kg (167 lb)  BMI 25.39 kg/m2  SpO2 99% I/O last 3 completed shifts: In: 400 [P.O.:400] Out: 750 [Urine:750] Total I/O In: -  Out: 1375 [Urine:1375]  FHT:  FHR: 140 bpm, variability: moderate,  accelerations:  Present,  decelerations:  Absent UC:   regular, every 2 minutes SVE:   Dilation: 3.5 Effacement (%): 80 Station: -1;0 Exam by:: connie Peach RN  Labs: Lab Results  Component Value Date   WBC 10.6* 08/30/2011   HGB 9.3* 08/30/2011   HCT 27.8* 08/30/2011   MCV 93.9 08/30/2011   PLT 165 08/30/2011    Assessment / Plan: Protracted latent phase.  Unable to titrate Pitocin to adequate MVU.  Labor: See above Preeclampsia:  N/A Fetal Wellbeing:  Category I Pain Control:  Epidural  Anticipated MOD:  A cesarean section is planned  JACKSON-MOORE,Leonardo Makris A 08/31/2011, 1:26 PM

## 2011-08-31 NOTE — Op Note (Signed)
Cesarean Section Procedure Note   DELOYCE WALTHERS   08/27/2011 - 08/31/2011  Indications: Protracted latent phase, PPROM, IUP @ 34 weeks   Pre-operative Diagnosis: Protracted latent phase, PPROM, IUP @ 34 weeks    Post-operative Diagnosis: Same   Surgeon: Antionette Char A  Assistants: none  Anesthesia: epidural  Procedure Details:  The patient was seen in the Holding Room. The risks, benefits, complications, treatment options, and expected outcomes were discussed with the patient. The patient concurred with the proposed plan, giving informed consent. The patient was identified as Margaretha Seeds and the procedure verified as C-Section Delivery. A Time Out was held and the above information confirmed.  After induction of anesthesia, the patient was draped and prepped in the usual sterile manner. A transverse incision was made and carried down through the subcutaneous tissue to the fascia. The fascial incision was made and extended transversely. The fascia was separated from the underlying rectus tissue superiorly and inferiorly. The peritoneum was identified and entered. The peritoneal incision was extended longitudinally.A low transverse uterine incision was made. Delivered from cephalic presentation was a  living newborn female infant.  A cord ph was sent. The umbilical cord was clamped and cut cord. A sample was obtained for evaluation. The placenta was removed Intact and appeared normal.  The uterine incision was closed with running locked sutures of 1-0 Monocryl. A second imbricating layer of the same suture was placed. A subserosal hematoma was noted extending from the left inferolateral margin of the incision.  An O'Leary suture was placed.  The serosa overlying the hematoma was dissected and bladder flap developed.  The hematoma was evacuated. A figure of eight suture of 0-Monocryl was placed in the myometrium.   Hemostasis was observed. The paracolic gutters were irrigated. The  fascia was then reapproximated with running sutures of 1-0Vicryl. The subcuticular closure was performed using 3-0 Monocryl.  Instrument, sponge, and needle counts were correct prior the abdominal closure and were correct at the conclusion of the case.    Findings:  See above.  Normal uterus, tubes, ovaries   Estimated Blood Loss: 600 ml  Total IV Fluids: per Anesthesiology  Urine Output: per Anesthesiology   Specimens:  Specimens    Placenta       Complications: no complications  Disposition: PACU - hemodynamically stable.  Maternal Condition: stable   Baby condition / location:  nursery-stable    Signed: Surgeon(s): Roseanna Rainbow, MD

## 2011-09-01 ENCOUNTER — Encounter (HOSPITAL_COMMUNITY): Payer: Self-pay | Admitting: *Deleted

## 2011-09-01 NOTE — Progress Notes (Signed)
  Subjective: POD# 1 s/p Cesarean Delivery.  Indications: failure to progress  RH status/Rubella reviewed. Feeding: bottle Patient reports tolerating PO.  Denies HA/SOB/C/P/N/V/dizziness.  Reports flatus or BM. Breast symptoms: no.  She reports vaginal bleeding as normal, without clots.  She is ambulating, urinating without difficulty.     Objective: Vital signs in last 24 hours: BP 99/65  Pulse 85  Temp(Src) 98.3 F (36.8 C) (Oral)  Resp 18  Ht 5\' 8"  (1.727 m)  Wt 75.751 kg (167 lb)  BMI 25.39 kg/m2  SpO2 99%       Physical Exam:  General: alert CV: Regular rate and rhythm Resp: clear Abdomen: soft, nontender, normal bowel sounds Lochia: minimal Uterine Fundus: firm, below umbilicus, nontender Incision: dressing clean, dry Ext: extremities normal, atraumatic, no cyanosis or edema    Basename 09/01/11 0555 08/30/11 1050  HGB 9.2* 9.3*  HCT -- 27.8*      Assessment/Plan: 30 y.o.  status post Cesarean section. POD# 1.   Doing well, stable.              Advance diet as tolerated Start po pain meds D/C foley  HLIV  Ambulate IS Routine post-op care  JACKSON-MOORE,Raechell Singleton A 09/01/2011, 10:16 AM

## 2011-09-01 NOTE — Anesthesia Postprocedure Evaluation (Signed)
  Anesthesia Post-op Note  Patient: [name]  Procedure(s) Performed:  CESAREAN SECTION - Primary Cesarean section Baby Boy @ [phone]  Patient Location: PACU and Women's Unit  Anesthesia Type: Epidural  Level of Consciousness: awake  Airway and Oxygen Therapy: Patient Spontanous Breathing  Post-op Pain: mild  Post-op Assessment: Post-op Vital signs reviewed  Post-op Vital Signs: Reviewed and stable  Complications: No apparent anesthesia complications

## 2011-09-01 NOTE — Progress Notes (Signed)
UR Chart review completed.  

## 2011-09-01 NOTE — Progress Notes (Signed)
PSYCHOSOCIAL ASSESSMENT ~ MATERNAL/CHILD Name: [name]                                                                                     Age: 30 day   Referral Date: 09/01/11  Reason/Source: NICU Support/NICU  I. FAMILY/HOME ENVIRONMENT A. Child's Legal Guardian __x_Parent(s) ___Grandparent ___Foster parent ___DSS_________________ Name: [name]                                     DOB: 02/27/1981          Age: 30   Address: [address] [place], [place] [address]  Name: [name]                                       DOB: //                     Age:   Address:  B. Other Household Members/Support Persons Name: [name]          Relationship: sister                       DOB ___/___/___                   Name:  [name]   Relationship: sister                       DOB ___/___/___                    C. Other Support: Good support system   II. PSYCHOSOCIAL DATA A. Information Source                                                                                             _x_Patient Interview  __Family Interview           _x_Other: chart  B. 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 STRENGTHS _x__Supportive family/friends _x__Adequate Resources _x__Compliance with medical plan _x__Home prepared for Child (including basic supplies)-Does not have preemie clothes and diapers _x__Understanding of illness      _x__Other: Two other children go to [facility]  Pediatricians IV. RISK FACTORS AND CURRENT PROBLEMS         __x__No Problems Noted                                                                                                                                                                                                                                       Pt              Family     Substance Abuse                                                                ___              ___        Mental Illness                                                                        ___              ___  Family/Relationship Issues                                      ___               ___             Abuse/Neglect/Domestic Violence  ___         ___  Financial Resources                                        ___              ___             Transportation                                                                        ___               ___  DSS Involvement                                                                   ___              ___  Adjustment to Illness                                                               ___              ___  Knowledge/Cognitive Deficit                                                   ___              ___             Compliance with Treatment                                                 ___              ___  Basic Needs (food, housing, etc.)                                          ___              ___             Housing Concerns                                       ___  ___ Other_____________________________________________________________            V. SOCIAL WORK ASSESSMENT SW met with MOB in her third floor room to introduce myself, complete assessment and evaluate how family is coping with baby's premature birth and admission to NICU.  MOB was very pleasant and seems to be coping well.  She states that she has a good support system and everything she  needs for baby at home, except for preemie clothes and diapers because she did not expect to have baby early.  SW made referral to [employer].  She states that she does not have any other needs or questions at this time and seemed very appreciative of SW's visit.  SW explained support services offered by NICU SWs and gave contact information.  VI. SOCIAL WORK PLAN  ___No Further Intervention Required/No Barriers to Discharge   _x__Psychosocial Support and Ongoing Assessment of Needs   ___Patient/Family Education:   ___Child Protective Services Report   County___________ Date___/____/____   ___Information/Referral to [employer]_________________________   _x__Other: Family Support Network referral for [employer]

## 2011-09-01 NOTE — Addendum Note (Signed)
Addendum  created 09/01/11 1610 by Cephus Shelling   Modules edited:Notes Section

## 2011-09-02 ENCOUNTER — Encounter (HOSPITAL_COMMUNITY): Payer: Self-pay | Admitting: Obstetrics & Gynecology

## 2011-09-02 DIAGNOSIS — IMO0002 Reserved for concepts with insufficient information to code with codable children: Secondary | ICD-10-CM | POA: Clinically undetermined

## 2011-09-02 MED ORDER — BISACODYL 10 MG RE SUPP
10.0000 mg | Freq: Once | RECTAL | Status: AC
  Administered 2011-09-02: 10 mg via RECTAL
  Filled 2011-09-02: qty 1

## 2011-09-02 NOTE — Progress Notes (Signed)
Subjective: POD# 2 s/p Cesarean Delivery.  Indications: failure to progress  RH status/Rubella reviewed. Feeding: bottle Patient reports tolerating PO.  Denies HA/SOB/C/P/N/V/dizziness.  Reports flatus or BM. Breast symptoms: no.  She reports vaginal bleeding as normal, without clots.  She is ambulating, urinating without difficulty.     Objective: Vital signs in last 24 hours: BP 114/76  Pulse 99  Temp(Src) 98.3 F (36.8 C) (Oral)  Resp 18  Ht 5\' 8"  (1.727 m)  Wt 75.751 kg (167 lb)  BMI 25.39 kg/m2  SpO2 97%  Breastfeeding? Unknown       Physical Exam:  General: alert CV: Regular rate and rhythm Resp: clear Abdomen: soft, nontender, normal bowel sounds Lochia: minimal Uterine Fundus: firm, below umbilicus, nontender Incision: clean, dry and intact Ext: extremities normal, atraumatic, no cyanosis or edema    Basename 09/01/11 0555  HGB 9.2*  HCT --      Assessment/Plan: 30 y.o.  status post Cesarean section. POD# 2.   Doing well, stable.               Ambulate IS Routine post-op care  JACKSON-MOORE,Kenzee Bassin A 09/02/2011, 5:38 PM

## 2011-09-03 MED ORDER — IBUPROFEN 600 MG PO TABS: 600.0000 mg | ORAL_TABLET | Freq: Four times a day (QID) | ORAL | Status: AC | PRN

## 2011-09-03 MED ORDER — OXYCODONE-ACETAMINOPHEN 5-325 MG PO TABS: 1.0000 | ORAL_TABLET | ORAL | Status: AC | PRN

## 2011-09-03 MED ORDER — FERROUS SULFATE 325 (65 FE) MG PO TABS: 325.0000 mg | ORAL_TABLET | Freq: Two times a day (BID) | ORAL | Status: DC

## 2011-09-03 NOTE — Progress Notes (Signed)
Subjective: POD #3 s/p LTC/S  Indication:failure to progress Patient reports tolerating PO.  Denies HA/SOB/C/P/N/V/dizziness.  Reports flatus or BM. Breast symptoms: no  She reports vaginal bleeding as normal, without clots.  She is ambulating, urinating without difficulty.     Objective: Vital signs in last 24 hours: BP 98/62  Pulse 75  Temp(Src) 98.6 F (37 C) (Oral)  Resp 18  Ht 5\' 8"  (1.727 m)  Wt 75.751 kg (167 lb)  BMI 25.39 kg/m2  SpO2 98%  Breastfeeding? Unknown  Physical Exam:  General: alert CV: Regular rate and rhythm Resp: clear Abdomen: soft, nontender, normal bowel sounds Lochia: minimal Uterine Fundus: firm, below umbilicus, nontender Incision: clean, dry and intact Ext: extremities normal, atraumatic, no cyanosis or edema  Basename 09/01/11 0555  HGB 9.2*  HCT --    Assessment/Plan: 30 y.o. status post Cesarean section POD# 3.  normal post-operative exam patient is a candidate for Depo-Provera for contraception, with no contraindications  Routine post-op care D/C home  JACKSON-MOORE,Jashayla Glatfelter A 09/03/2011, 6:20 AM

## 2011-09-03 NOTE — Discharge Summary (Signed)
Obstetric Discharge Summary Reason for Admission: rupture of membranes Prenatal Procedures: none Intrapartum Procedures: cesarean: low cervical, transverse Postpartum Procedures: none Complications-Operative and Postpartum: none Hemoglobin  Date Value Range Status  09/01/2011 9.2* 12.0-15.0 (g/dL) Final     HCT  Date Value Range Status  08/30/2011 27.8* 36.0-46.0 (%) Final    Discharge Diagnoses: PPROM, protracted latent phase  Discharge Information: Date: 09/03/2011 Activity: pelvic rest Diet: routine Medications: PNV, Ibuprofen, Iron and Percocet Condition: stable Instructions: See above Discharge to: home Follow-up Information    Follow up with Antionette Char A, MD. Call in 2 weeks.   Contact information:   [address], [address] [place] [address] [phone]          Newborn Data: Live born female  Birth Weight: 4 lb 9.6 oz (2085 g) APGAR: 9, 9  Home with NICU.  JACKSON-MOORE,Staci Carver A 09/03/2011, 6:25 AM

## 2011-09-03 NOTE — Progress Notes (Signed)
Spiritual Care - Visited with patient as she was waiting for discharge.  She seems to be  coping well with leaving hospital without her baby though she wishes it was not necessary.  Hopes to have him home by Thanksgiving.  She has two girls at home ages 51 and 26.  Explained availability of chaplaincy services during the time in NICU as she returns to visit.  Dory Horn, Chaplain

## 2011-12-12 IMAGING — US US OB COMP +14 WK
1 series · 12 of 28 positions shown · non-contrast
Comparison: none

[Series 1: us ob comp +14 wk · 38 acquisitions, 12 frames shown]
[im 2/38]
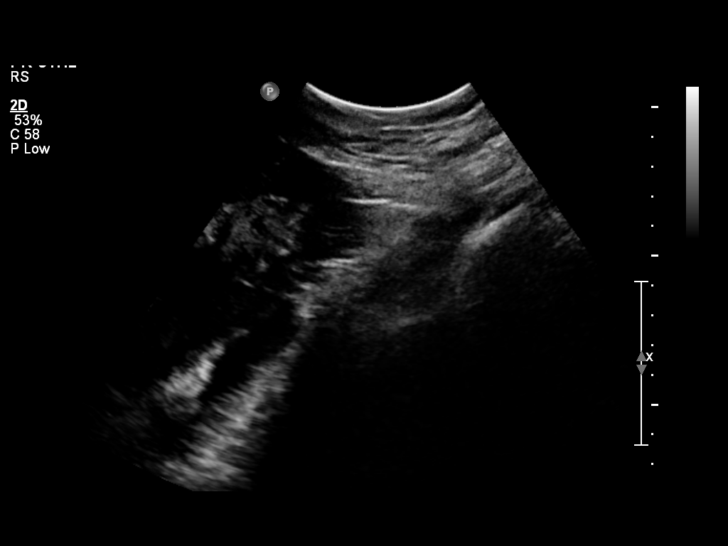
[im 5/38]
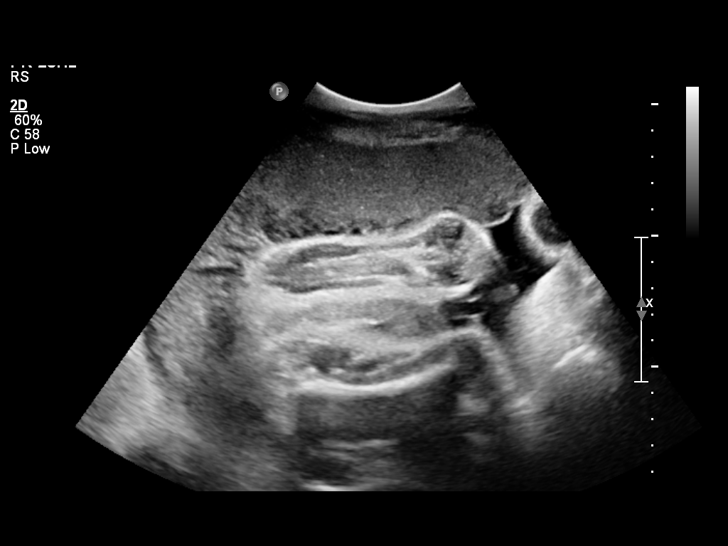
[im 7/38]
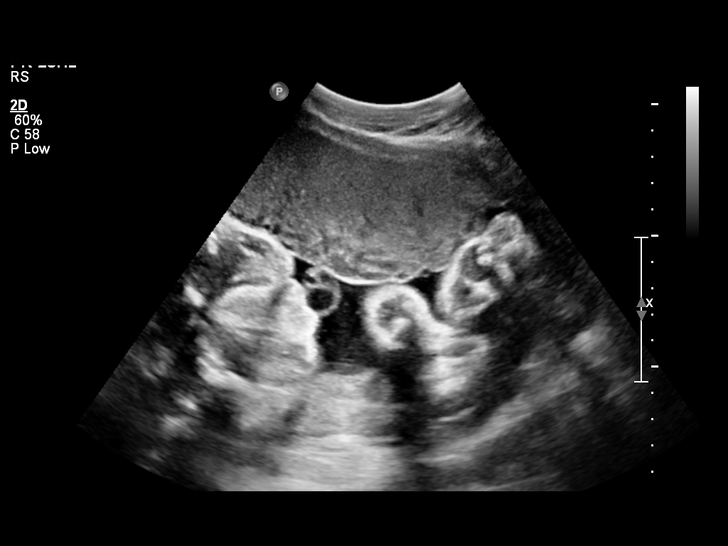
[im 11/38]
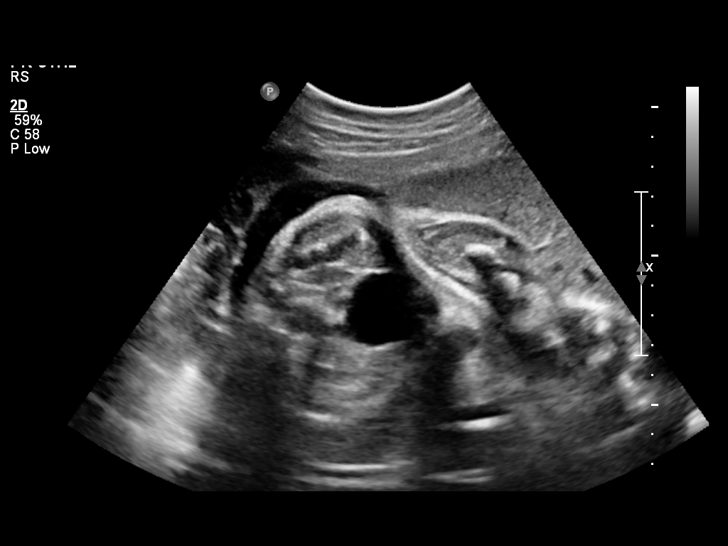
[im 14/38]
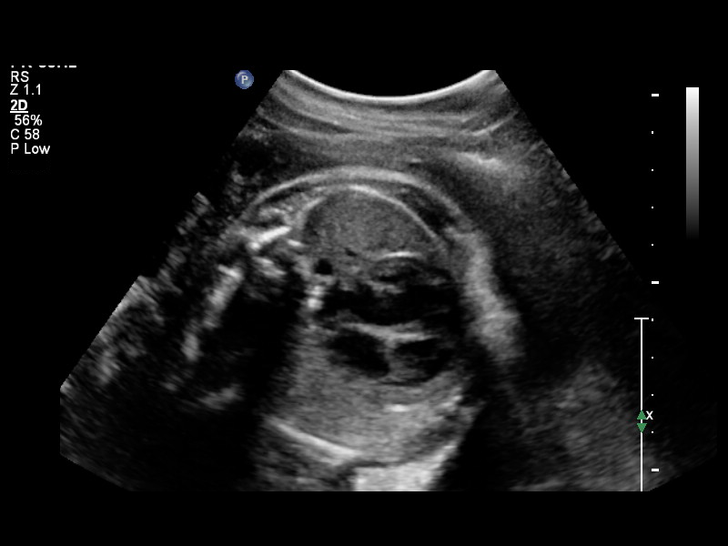
[im 17/38]
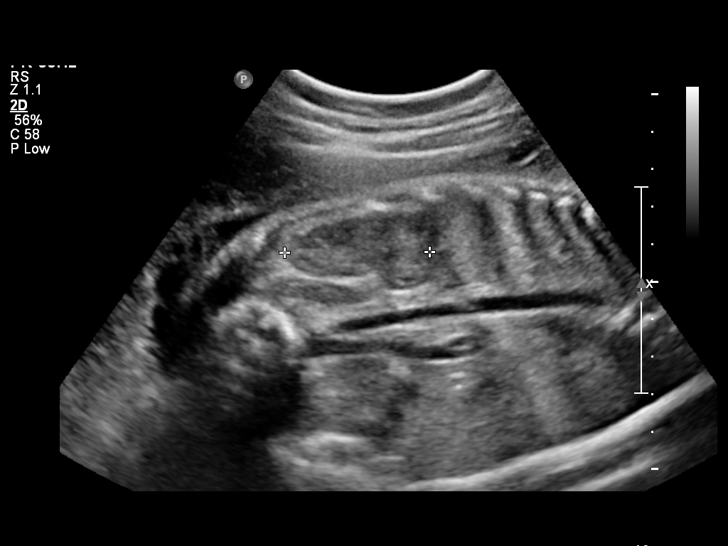
[im 21/38]
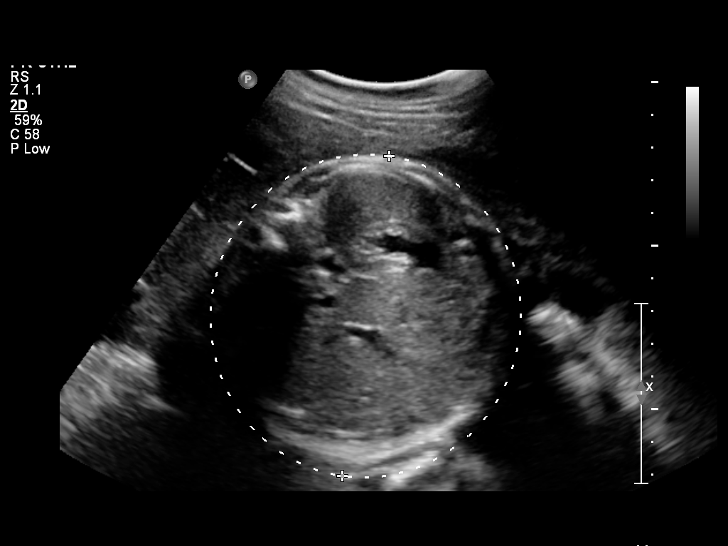
[im 24/38]
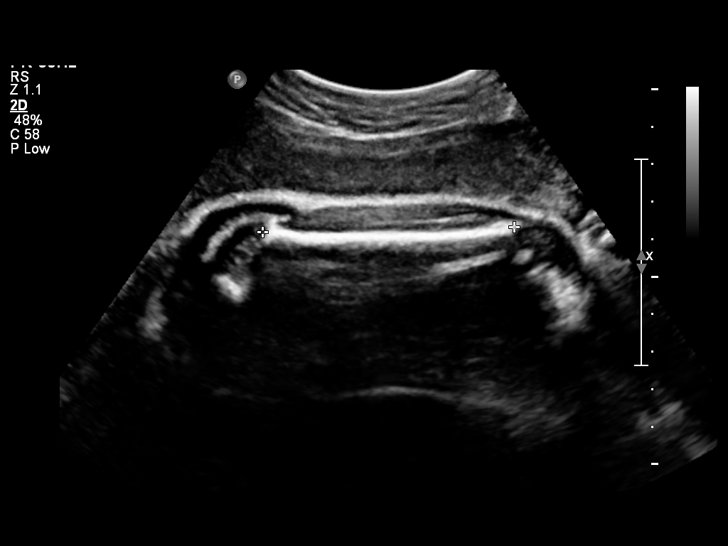
[im 27/38]
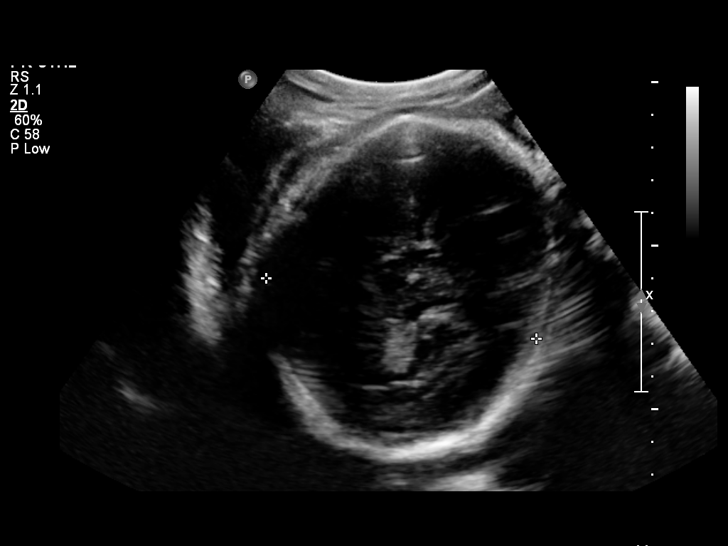
[im 31/38]
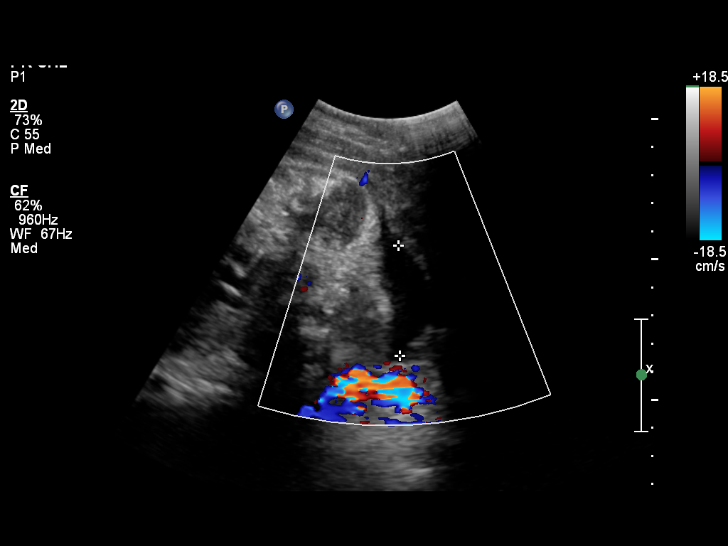
[im 33/38]
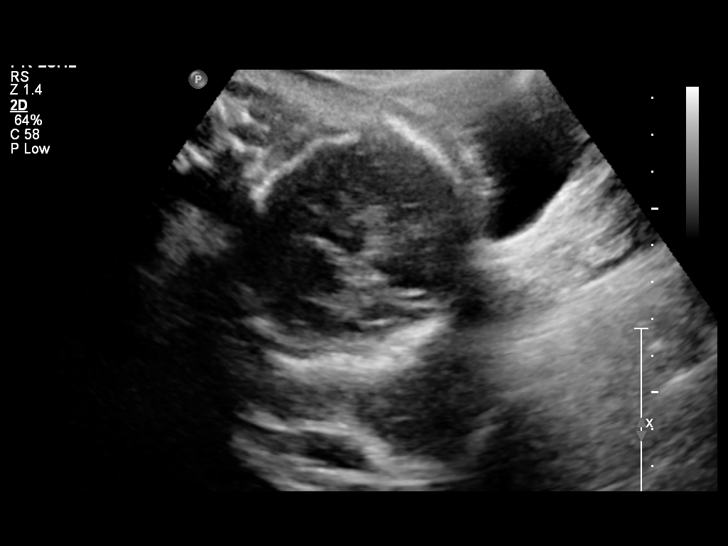
[im 36/38]
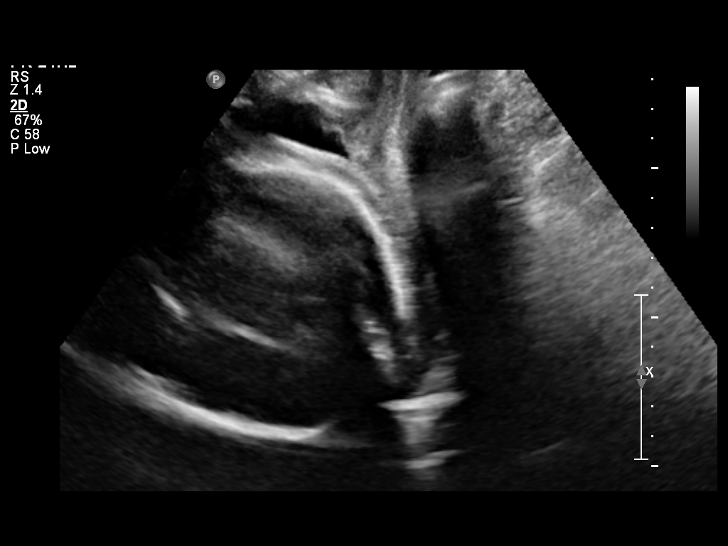

[12 of 28 positions shown; findings below may reference images not displayed]

OBSTETRICS REPORT
                      (Signed Final 08/27/2011 [DATE])

 Order#:         [id number]
Procedures

 US OB COMP + 14 WK                                    76805.1
Indications

 Previous cervical surgery (colposcopy, LEEP)
 Cigarette smoker
 Premature rupture of membranes - leaking fluid
Fetal Evaluation

 Fetal Heart Rate:  138                          bpm
 Cardiac Activity:  Observed
 Presentation:      Cephalic
 Placenta:          Anterior, above cervical os

 Amniotic Fluid
 AFI FV:      Subjectively within normal limits
 AFI Sum:     13.7    cm       46  %Tile      Larg Pckt:   4.44  cm
 RUQ:   3.89    cm   RLQ:    4.44   cm    LUQ:   3.51    cm   LLQ:    1.86   cm
Biometry

 BPD:     84.2  mm     G. Age:  33w 6d                CI:        77.08   70 - 86
                                                      FL/HC:       22.1  19.4 -

 HC:     303.7  mm     G. Age:  33w 5d       20  %    HC/AC:       1.00  0.96 -

 AC:     304.9  mm     G. Age:  34w 3d       77  %    FL/BPD:      79.6  71 - 87
 FL:        67  mm     G. Age:  34w 4d       63  %    FL/AC:       22.0  20 - 24

 Est. FW:    0750  gm      5 lb 5 oz     74  %
Gestational Age

 LMP:           33w 4d        Date:  01/04/11                 EDD:   10/11/11
 U/S Today:     34w 1d                                        EDD:   10/07/11
 Best:          33w 4d     Det. By:  LMP  (01/04/11)          EDD:   10/11/11
Anatomy
 Cranium:           Appears normal      Aortic Arch:       Basic anatomy
                                                           exam per order
 Fetal Cavum:       Not well            Ductal Arch:       Basic anatomy
                    visualized                             exam per order
 Ventricles:        Not well            Diaphragm:         Appears normal
                    visualized
 Choroid Plexus:    Not well            Stomach:           Appears normal,
                    visualized                             left sided
 Cerebellum:        Not well            Abdomen:           Appears normal
                    visualized
 Posterior Fossa:   Not well            Abdominal Wall:    Not well
                    visualized                             visualized
 Nuchal Fold:       Not applicable      Cord Vessels:      Appears normal
                    (>20 wks GA)                           (3 vessel cord)
 Face:              Not well            Kidneys:           Appear normal
                    visualized
 Heart:             Not well            Bladder:           Appears normal
                    visualized
 RVOT:              Basic anatomy       Spine:             Not well
                    exam per order                         visualized
 LVOT:              Basic anatomy       Limbs:             Not well
                    exam per order                         visualized

 Other:     Technically difficult due to advanced GA and fetal position.
Cervix Uterus Adnexa

 Cervical Length:    2.2       cm

 Cervix:       Closed. Normal appearance by transabdominal scan.

 Adnexa:     No abnormality visualized.
Comments

 Translabial evaluation was suboptimal for assessing cervical
 length. Endovaginal exam was not performed at this time due
 to clinical question of leaking fluid. This can be pursued at a
 later time if desired.
Impression

 Single intrauterine gestation demonstrating an estimated
 gestational age by ultrasound of 34w 1d. This is correlated
 with expected estimated gestational age by LMP of 33w 4d.
 EFW is currently at the 74%.

 Visualized fetal anatomy appears normal, but overall evaluatio
 was limited by advanced gestational age and fetal position. No
 focal placental abnormality is seen.

 Subjectively and quantitatively normal amniotic fluid volume.

 Shortened cervical length suggested transabdominally.

## 2012-03-22 ENCOUNTER — Emergency Department (HOSPITAL_COMMUNITY)
Admission: EM | Admit: 2012-03-22 | Discharge: 2012-03-22 | Discharge status: Against Medical Advice | Payer: Self-pay | Attending: Emergency Medicine | Admitting: Emergency Medicine

## 2012-03-22 ENCOUNTER — Encounter (HOSPITAL_COMMUNITY): Payer: Self-pay | Admitting: *Deleted

## 2012-03-22 DIAGNOSIS — R51 Headache: Secondary | ICD-10-CM | POA: Insufficient documentation

## 2012-03-22 NOTE — ED Notes (Signed)
Called patient in waiting area x2; no answer.

## 2012-03-22 NOTE — ED Notes (Signed)
PT is here with headaches for the last couple of months and makes her eyes hurt and vision blurry

## 2012-03-22 NOTE — ED Notes (Signed)
Called patient in ED waiting room no response

## 2012-03-24 NOTE — ED Provider Notes (Signed)
History     CSN: [id number]  Arrival date & time 03/22/12  1039   First MD Initiated Contact with Patient 03/22/12 1520      Chief Complaint  Patient presents with  . Headache    (Consider location/radiation/quality/duration/timing/severity/associated sxs/prior treatment) HPI  Past Medical History  Diagnosis Date  . Headache   . Abnormal Pap smear   . Gonorrhea   . Chlamydia   . Trichomonas   . HPV (human papilloma virus) infection   . Urinary tract infection     Past Surgical History  Procedure Date  . Colposcopy 2010  . Leep   . Cesarean section 08/31/2011    Procedure: CESAREAN SECTION;  Surgeon: Roseanna Rainbow, MD;  Location: [facility];  Service: Gynecology;  Laterality: N/A;  Primary Cesarean section Baby Boy @ 1418    No family history on file.  History  Substance Use Topics  . Smoking status: Former Smoker -- 5 years    Types: Cigarettes  . Smokeless tobacco: Never Used   Comment: quit with + preg  . Alcohol Use: No    OB History    Grav Para Term Preterm Abortions TAB SAB Ect Mult Living   3 3 2 1      3       Review of Systems  Allergies  Review of patient's allergies indicates no known allergies.  Home Medications   Current Outpatient Rx  Name Route Sig Dispense Refill  . IBUPROFEN 200 MG PO TABS Oral Take 400 mg by mouth every 6 (six) hours as needed. For headache      BP 104/71  Pulse 97  Temp(Src) 98.3 F (36.8 C) (Oral)  Resp 20  SpO2 100%  Physical Exam  ED Course  Procedures (including critical care time)  Labs Reviewed - No data to display No results found.   No diagnosis found.    MDM          Pt eloped from triage prior to being placed into a room without being seen        Suzi Roots, MD 03/24/12 [phone]

## 2013-02-08 ENCOUNTER — Emergency Department (HOSPITAL_COMMUNITY)
Admission: EM | Admit: 2013-02-08 | Discharge: 2013-02-08 | Disposition: A | Discharge status: Home / Self Care | Payer: BC Managed Care – HMO | Attending: Emergency Medicine | Admitting: Emergency Medicine

## 2013-02-08 ENCOUNTER — Encounter (HOSPITAL_COMMUNITY): Payer: Self-pay | Admitting: Family Medicine

## 2013-02-08 DIAGNOSIS — Z8619 Personal history of other infectious and parasitic diseases: Secondary | ICD-10-CM | POA: Insufficient documentation

## 2013-02-08 DIAGNOSIS — Z87891 Personal history of nicotine dependence: Secondary | ICD-10-CM | POA: Insufficient documentation

## 2013-02-08 DIAGNOSIS — Z87440 Personal history of urinary (tract) infections: Secondary | ICD-10-CM | POA: Insufficient documentation

## 2013-02-08 DIAGNOSIS — J020 Streptococcal pharyngitis: Secondary | ICD-10-CM

## 2013-02-08 DIAGNOSIS — R599 Enlarged lymph nodes, unspecified: Secondary | ICD-10-CM | POA: Insufficient documentation

## 2013-02-08 LAB — RAPID STREP SCREEN (MED CTR MEBANE ONLY): Streptococcus, Group A Screen (Direct): POSITIVE — AB

## 2013-02-08 MED ORDER — AMOXICILLIN 500 MG PO CAPS: 500.0000 mg | ORAL_CAPSULE | Freq: Three times a day (TID) | ORAL | Status: DC

## 2013-02-08 NOTE — ED Notes (Signed)
Per pt sts 3 days of being unable to talk without a sore throat and now there is pain and white spots on throat.

## 2013-02-08 NOTE — ED Provider Notes (Signed)
History     CSN: [id number]  Arrival date & time 02/08/13  1802   First MD Initiated Contact with Patient 02/08/13 1920      Chief Complaint  Patient presents with  . Sore Throat    (Consider location/radiation/quality/duration/timing/severity/associated sxs/prior treatment) HPI Comments: Patient presents to the emergency department for increasing sore throat over the past 3 days.  Pain has been steadily worsening and it is now becoming painful for her to swallow.  Pt states she had a friend look in the back of her throat and was noted to have white spots on her tonsils. Denies any difficulty swallowing, fever, chills, or sweats.  No sick contacts.  Has not tried any medications for her sx.  The history is provided by the patient.    Past Medical History  Diagnosis Date  . Headache   . Abnormal Pap smear   . Gonorrhea   . Chlamydia   . Trichomonas   . HPV (human papilloma virus) infection   . Urinary tract infection     Past Surgical History  Procedure Laterality Date  . Colposcopy  2010  . Leep    . Cesarean section  08/31/2011    Procedure: CESAREAN SECTION;  Surgeon: Roseanna Rainbow, MD;  Location: [facility];  Service: Gynecology;  Laterality: N/A;  Primary Cesarean section Baby Boy @ 1418    History reviewed. No pertinent family history.  History  Substance Use Topics  . Smoking status: Former Smoker -- 5 years    Types: Cigarettes  . Smokeless tobacco: Never Used     Comment: quit with + preg  . Alcohol Use: No    OB History   Grav Para Term Preterm Abortions TAB SAB Ect Mult Living   3 3 2 1      3       Review of Systems  HENT: Positive for sore throat.   All other systems reviewed and are negative.    Allergies  Review of patient's allergies indicates no known allergies.  Home Medications   Current Outpatient Rx  Name  Route  Sig  Dispense  Refill  . medroxyPROGESTERone (DEPO-PROVERA) 150 MG/ML injection   Intramuscular   Inject 150 mg  into the muscle every 3 (three) months.           BP 109/67  Pulse 98  Temp(Src) 98.8 F (37.1 C) (Oral)  Resp 18  SpO2 100%  Physical Exam  Nursing note and vitals reviewed. Constitutional: She is oriented to person, place, and time. She appears well-developed and well-nourished.  HENT:  Head: Normocephalic and atraumatic. No trismus in the jaw.  Mouth/Throat: Uvula is midline and mucous membranes are normal. No edematous. Posterior oropharyngeal erythema present. No oropharyngeal exudate, posterior oropharyngeal edema or tonsillar abscesses.  Tonsils swollen 2+ with exudate, no evidence of PTA or tonsillar abscess, handling secretions appropriately  Eyes: Conjunctivae and EOM are normal. Pupils are equal, round, and reactive to light.  Neck: Normal range of motion. Neck supple. No spinous process tenderness and no muscular tenderness present. No rigidity.  No meningeal signs  Cardiovascular: Normal rate, regular rhythm and normal heart sounds.   Pulmonary/Chest: Effort normal and breath sounds normal.  Musculoskeletal: Normal range of motion.  Lymphadenopathy:    She has cervical adenopathy.  Neurological: She is alert and oriented to person, place, and time.  Skin: Skin is warm and dry.  Psychiatric: She has a normal mood and affect.    ED Course  Procedures (  including critical care time)  Labs Reviewed  RAPID STREP SCREEN - Abnormal; Notable for the following:    Streptococcus, Group A Screen (Direct) POSITIVE (*)    All other components within normal limits   No results found.   1. Strep pharyngitis       MDM   Patient presents to the emergency department for 3 days of sore throat.  Rapid strep obtained and is positive. Patient will be treated with course of amoxicillin 10d. She was instructed that she may use Chloraseptic throat spray and salt water gargles for added relief. Discussed plan with pt- she agreed. Return precautions asked.       Garlon Hatchet, PA-C 02/09/13 [phone]

## 2013-02-09 NOTE — ED Provider Notes (Signed)
Medical screening examination/treatment/procedure(s) were performed by non-physician practitioner and as supervising physician I was immediately available for consultation/collaboration.   Dione Booze, MD 02/09/13 1104

## 2013-09-07 ENCOUNTER — Emergency Department (HOSPITAL_COMMUNITY)
Admission: EM | Admit: 2013-09-07 | Discharge: 2013-09-07 | Disposition: A | Discharge status: Home / Self Care | Payer: BC Managed Care – HMO | Attending: Emergency Medicine | Admitting: Emergency Medicine

## 2013-09-07 ENCOUNTER — Emergency Department (HOSPITAL_COMMUNITY): Payer: BC Managed Care – HMO

## 2013-09-07 ENCOUNTER — Encounter (HOSPITAL_COMMUNITY): Payer: Self-pay | Admitting: Emergency Medicine

## 2013-09-07 DIAGNOSIS — F17200 Nicotine dependence, unspecified, uncomplicated: Secondary | ICD-10-CM | POA: Insufficient documentation

## 2013-09-07 DIAGNOSIS — M549 Dorsalgia, unspecified: Secondary | ICD-10-CM | POA: Insufficient documentation

## 2013-09-07 DIAGNOSIS — R042 Hemoptysis: Secondary | ICD-10-CM | POA: Insufficient documentation

## 2013-09-07 DIAGNOSIS — X58XXXA Exposure to other specified factors, initial encounter: Secondary | ICD-10-CM | POA: Insufficient documentation

## 2013-09-07 DIAGNOSIS — H9209 Otalgia, unspecified ear: Secondary | ICD-10-CM | POA: Insufficient documentation

## 2013-09-07 DIAGNOSIS — Z3202 Encounter for pregnancy test, result negative: Secondary | ICD-10-CM | POA: Insufficient documentation

## 2013-09-07 DIAGNOSIS — J069 Acute upper respiratory infection, unspecified: Secondary | ICD-10-CM | POA: Insufficient documentation

## 2013-09-07 DIAGNOSIS — Z87440 Personal history of urinary (tract) infections: Secondary | ICD-10-CM | POA: Insufficient documentation

## 2013-09-07 DIAGNOSIS — Z79899 Other long term (current) drug therapy: Secondary | ICD-10-CM | POA: Insufficient documentation

## 2013-09-07 DIAGNOSIS — Y929 Unspecified place or not applicable: Secondary | ICD-10-CM | POA: Insufficient documentation

## 2013-09-07 DIAGNOSIS — S39012A Strain of muscle, fascia and tendon of lower back, initial encounter: Secondary | ICD-10-CM

## 2013-09-07 DIAGNOSIS — Y9389 Activity, other specified: Secondary | ICD-10-CM | POA: Insufficient documentation

## 2013-09-07 DIAGNOSIS — N898 Other specified noninflammatory disorders of vagina: Secondary | ICD-10-CM | POA: Insufficient documentation

## 2013-09-07 DIAGNOSIS — S335XXA Sprain of ligaments of lumbar spine, initial encounter: Secondary | ICD-10-CM | POA: Insufficient documentation

## 2013-09-07 DIAGNOSIS — Z8619 Personal history of other infectious and parasitic diseases: Secondary | ICD-10-CM | POA: Insufficient documentation

## 2013-09-07 LAB — URINE MICROSCOPIC-ADD ON

## 2013-09-07 LAB — URINALYSIS, ROUTINE W REFLEX MICROSCOPIC
Ketones, ur: NEGATIVE mg/dL
Nitrite: NEGATIVE
Protein, ur: NEGATIVE mg/dL
pH: 6 (ref 5.0–8.0)

## 2013-09-07 LAB — WET PREP, GENITAL

## 2013-09-07 LAB — POCT PREGNANCY, URINE: Preg Test, Ur: NEGATIVE

## 2013-09-07 MED ORDER — CEFTRIAXONE SODIUM 250 MG IJ SOLR
250.0000 mg | Freq: Once | INTRAMUSCULAR | Status: AC
  Administered 2013-09-07: 250 mg via INTRAMUSCULAR
  Filled 2013-09-07: qty 250

## 2013-09-07 MED ORDER — IBUPROFEN 600 MG PO TABS: 600.0000 mg | ORAL_TABLET | Freq: Four times a day (QID) | ORAL | Status: DC | PRN

## 2013-09-07 MED ORDER — KETOROLAC TROMETHAMINE 30 MG/ML IJ SOLN
30.0000 mg | Freq: Once | INTRAMUSCULAR | Status: AC
  Administered 2013-09-07: 30 mg via INTRAMUSCULAR
  Filled 2013-09-07: qty 1

## 2013-09-07 MED ORDER — AZITHROMYCIN 250 MG PO TABS
1000.0000 mg | ORAL_TABLET | Freq: Once | ORAL | Status: AC
  Administered 2013-09-07: 1000 mg via ORAL
  Filled 2013-09-07: qty 4

## 2013-09-07 NOTE — ED Provider Notes (Signed)
CSN: [id number]     Arrival date & time 09/07/13  4098 History   First MD Initiated Contact with Patient 09/07/13 [phone]     Chief Complaint  Patient presents with  . Hemoptysis  . Otalgia  . Back Pain    lower   (Consider location/radiation/quality/duration/timing/severity/associated sxs/prior Treatment) HPI  This is a 32 year old female who presents with multiple complaints. Patient reports 2 week history of cough. She is noted in the last 2-3 days streaks of blood in her sputum. She denies any massive hemoptysis. She denies any chest pain or shortness of breath. She denies fevers. Patient also has developed an earache bilaterally in the last 2 days. She reports lower back pain. She denies any injury. She denies any bowel or bladder issues. She denies any lower extremity weakness, numbness, or tingling. Patient also states "I think my boyfriend gave me something." She reports dysuria and vaginal discharge. She denies any abdominal pain.  Past Medical History  Diagnosis Date  . Headache(784.0)   . Abnormal Pap smear   . Gonorrhea   . Chlamydia   . Trichomonas   . HPV (human papilloma virus) infection   . Urinary tract infection    Past Surgical History  Procedure Laterality Date  . Colposcopy  2010  . Leep    . Cesarean section  08/31/2011    Procedure: CESAREAN SECTION;  Surgeon: Roseanna Rainbow, MD;  Location: [facility];  Service: Gynecology;  Laterality: N/A;  Primary Cesarean section Baby Boy @ 1418   No family history on file. History  Substance Use Topics  . Smoking status: Current Every Day Smoker -- 5 years    Types: Cigarettes  . Smokeless tobacco: Never Used     Comment: quit with + preg  . Alcohol Use: No   OB History   Grav Para Term Preterm Abortions TAB SAB Ect Mult Living   3 3 2 1      3      Review of Systems  Constitutional: Negative for fever.  Respiratory: Positive for cough. Negative for chest tightness and shortness of breath.   Cardiovascular:  Negative for chest pain.  Gastrointestinal: Negative for nausea, vomiting and abdominal pain.  Genitourinary: Positive for dysuria and vaginal discharge.  Musculoskeletal: Positive for back pain. Negative for gait problem.  Skin: Negative for wound.  Neurological: Negative for weakness, numbness and headaches.  All other systems reviewed and are negative.    Allergies  Review of patient's allergies indicates no known allergies.  Home Medications   Current Outpatient Rx  Name  Route  Sig  Dispense  Refill  . medroxyPROGESTERone (DEPO-PROVERA) 150 MG/ML injection   Intramuscular   Inject 150 mg into the muscle every 3 (three) months.         . Pseudoephedrine-APAP-DM (TYLENOL FLU PO)   Oral   Take 10 mLs by mouth every 8 (eight) hours as needed (cold/flu).         Marland Kitchen ibuprofen (ADVIL,MOTRIN) 600 MG tablet   Oral   Take 1 tablet (600 mg total) by mouth every 6 (six) hours as needed.   30 tablet   0    BP 117/80  Pulse 91  Temp(Src) 98.9 F (37.2 C) (Oral)  Resp 20  SpO2 98% Physical Exam  Nursing note and vitals reviewed. Constitutional: She is oriented to person, place, and time. She appears well-developed and well-nourished. No distress.  HENT:  Head: Normocephalic and atraumatic.  Right Ear: External ear normal.  Left  Ear: External ear normal.  Mouth/Throat: Oropharynx is clear and moist.  Eyes: Pupils are equal, round, and reactive to light.  Neck: Neck supple.  Cardiovascular: Normal rate, regular rhythm and normal heart sounds.   No murmur heard. Pulmonary/Chest: Effort normal and breath sounds normal. No respiratory distress. She has no wheezes.  Abdominal: Soft. Bowel sounds are normal. There is no tenderness.  Genitourinary:  Normal external genitalia without lesions, copious frothy vaginal discharge, no redness or erythema to the cervix, no cervical motion tenderness  Musculoskeletal: She exhibits no edema.  No midline tenderness of the lumbar spine,  bilateral paraspinous muscle tenderness. No overlying skin changes.  Neurological: She is alert and oriented to person, place, and time.  Skin: Skin is warm and dry.  Psychiatric: She has a normal mood and affect.    ED Course  Procedures (including critical care time) Labs Review Labs Reviewed  WET PREP, GENITAL - Abnormal; Notable for the following:    WBC, Wet Prep HPF POC MODERATE (*)    All other components within normal limits  GC/CHLAMYDIA PROBE AMP  URINALYSIS, ROUTINE W REFLEX MICROSCOPIC  POCT PREGNANCY, URINE   Imaging Review Dg Chest 2 View  09/07/2013   CLINICAL DATA:  Hemoptysis, chest pain  EXAM: CHEST  2 VIEW  COMPARISON:  09/04/2010  FINDINGS: The heart size and mediastinal contours are within normal limits. Both lungs are clear. The visualized skeletal structures are unremarkable. Nipple shadows are noted bilaterally.  IMPRESSION: No active cardiopulmonary disease.   Electronically Signed   By: Alcide Clever M.D.   On: 09/07/2013 08:19    EKG Interpretation   None       MDM   1. Upper respiratory infection   2. Lumbosacral strain, initial encounter   3. Vaginal discharge     This is a 32 year old female who presents with multiple complaints. She is nontoxic-appearing and her vital signs are within normal limits. She has no evidence of midline tenderness and history is not suggestive of cauda equina. Most likely an acute lumbosacral strain. Patient was given Toradol.  The patient's upper respiratory symptoms including cough and ear pain are most likely viral. Patient has been afebrile and is not short of breath. Chest x-ray is negative for pneumonia. Patient does report a small amount of streaking blood in her sputum. This likely secondary to irritation with chronic two-week cough. Advised patient of this and gave her strict return precautions if she develops grossly bloody sputum. Pelvic exam notable for moderate amount of white discharge. Patient elected to be  treated for STDs. We. She was given azithromycin and Rocephin. She has no evidence of PID. Other lab work is unremarkable. Patient will be discharged home.  After history, exam, and medical workup I feel the patient has been appropriately medically screened and is safe for discharge home. Pertinent diagnoses were discussed with the patient. Patient was given return precautions.    Shon Baton, MD 09/07/13 [phone]

## 2013-09-07 NOTE — ED Notes (Signed)
Pt c/o coughing up blood and earache x 2 days and lower back pain x 1 week. States she has thick mucus with cough with spots of blood in mucus.

## 2013-09-09 ENCOUNTER — Encounter (HOSPITAL_COMMUNITY): Payer: Self-pay | Admitting: Emergency Medicine

## 2013-09-09 ENCOUNTER — Emergency Department (HOSPITAL_COMMUNITY)
Admission: EM | Admit: 2013-09-09 | Discharge: 2013-09-09 | Disposition: A | Discharge status: Home / Self Care | Payer: BC Managed Care – HMO | Attending: Emergency Medicine | Admitting: Emergency Medicine

## 2013-09-09 DIAGNOSIS — Z79899 Other long term (current) drug therapy: Secondary | ICD-10-CM | POA: Insufficient documentation

## 2013-09-09 DIAGNOSIS — A599 Trichomoniasis, unspecified: Secondary | ICD-10-CM

## 2013-09-09 DIAGNOSIS — Z8619 Personal history of other infectious and parasitic diseases: Secondary | ICD-10-CM | POA: Insufficient documentation

## 2013-09-09 DIAGNOSIS — A5900 Urogenital trichomoniasis, unspecified: Secondary | ICD-10-CM | POA: Insufficient documentation

## 2013-09-09 DIAGNOSIS — F17200 Nicotine dependence, unspecified, uncomplicated: Secondary | ICD-10-CM | POA: Insufficient documentation

## 2013-09-09 LAB — URINALYSIS, ROUTINE W REFLEX MICROSCOPIC
Bilirubin Urine: NEGATIVE
Glucose, UA: NEGATIVE mg/dL
Ketones, ur: NEGATIVE mg/dL
Nitrite: NEGATIVE
Protein, ur: NEGATIVE mg/dL

## 2013-09-09 LAB — URINE MICROSCOPIC-ADD ON

## 2013-09-09 LAB — WET PREP, GENITAL
Clue Cells Wet Prep HPF POC: NONE SEEN
Yeast Wet Prep HPF POC: NONE SEEN

## 2013-09-09 MED ORDER — METRONIDAZOLE 500 MG PO TABS: 500.0000 mg | ORAL_TABLET | Freq: Two times a day (BID) | ORAL | Status: DC

## 2013-09-09 NOTE — ED Notes (Addendum)
Pt reports she was seen and treated in ED on 11/13 for URI and vaginal discharge. Pts boyfriend had recently been dx with std and treated for trichamonis. Pt was treated for ghonnorea and clamydia but not trichomonis.  Pt reports she is still having yellow discharge, denies odor, pain still present from before 5/10. Dysuria.  Pt also wants to know lab work results.

## 2013-09-09 NOTE — ED Provider Notes (Signed)
CSN: [id number]     Arrival date & time 09/09/13  8295 History   First MD Initiated Contact with Patient 09/09/13 [phone]     Chief Complaint  Patient presents with  . SEXUALLY TRANSMITTED DISEASE   (Consider location/radiation/quality/duration/timing/severity/associated sxs/prior Treatment) Patient is a 32 y.o. female presenting with vaginal discharge. The history is provided by the patient. No language interpreter was used.  Vaginal Discharge Quality:  Yellow Severity:  Mild Onset quality:  Gradual Associated symptoms: no abdominal pain, no dysuria, no fever, no nausea and no vomiting    Pt is a 32 year old female who returns after being seen a couple days ago. She reports that her boyfriend recently got diagnosed with trich and she wants to make sure she doesn't have it. She reports that she has had vaginal itching and a yellowish discharge for a few days. She denies any dysuria, fever, chills, abdominal pain, nausea or vomiting. She reports that she was treated for GC/Chlamydia at her last visit.   Past Medical History  Diagnosis Date  . Headache(784.0)   . Abnormal Pap smear   . Gonorrhea   . Chlamydia   . Trichomonas   . HPV (human papilloma virus) infection   . Urinary tract infection    Past Surgical History  Procedure Laterality Date  . Colposcopy  2010  . Leep    . Cesarean section  08/31/2011    Procedure: CESAREAN SECTION;  Surgeon: Roseanna Rainbow, MD;  Location: [facility];  Service: Gynecology;  Laterality: N/A;  Primary Cesarean section Baby Boy @ 1418   History reviewed. No pertinent family history. History  Substance Use Topics  . Smoking status: Current Every Day Smoker -- 5 years    Types: Cigarettes  . Smokeless tobacco: Never Used     Comment: quit with + preg  . Alcohol Use: No   OB History   Grav Para Term Preterm Abortions TAB SAB Ect Mult Living   3 3 2 1      3      Review of Systems  Constitutional: Negative for fever and chills.   Gastrointestinal: Negative for nausea, vomiting, abdominal pain and diarrhea.  Genitourinary: Positive for vaginal discharge. Negative for dysuria, genital sores, menstrual problem and pelvic pain.  All other systems reviewed and are negative.    Allergies  Review of patient's allergies indicates no known allergies.  Home Medications   Current Outpatient Rx  Name  Route  Sig  Dispense  Refill  . ibuprofen (ADVIL,MOTRIN) 600 MG tablet   Oral   Take 1 tablet (600 mg total) by mouth every 6 (six) hours as needed.   30 tablet   0   . medroxyPROGESTERone (DEPO-PROVERA) 150 MG/ML injection   Intramuscular   Inject 150 mg into the muscle every 3 (three) months.         . Pseudoephedrine-APAP-DM (TYLENOL FLU PO)   Oral   Take 10 mLs by mouth every 8 (eight) hours as needed (cold/flu).          BP 104/64  Pulse 93  Temp(Src) 98.7 F (37.1 C) (Oral)  Resp 16  SpO2 98% Physical Exam  Nursing note and vitals reviewed. Constitutional: She is oriented to person, place, and time. She appears well-developed and well-nourished. No distress.  HENT:  Head: Normocephalic and atraumatic.  Eyes: Conjunctivae and EOM are normal.  Neck: Normal range of motion. Neck supple. No JVD present. No tracheal deviation present. No thyromegaly present.  Cardiovascular: Normal  rate, regular rhythm and normal heart sounds.   Pulmonary/Chest: Effort normal and breath sounds normal. No respiratory distress. She has no wheezes.  Abdominal: Soft. Bowel sounds are normal. She exhibits no distension. There is no tenderness.  Genitourinary: Uterus normal. Pelvic exam was performed with patient supine. There is no rash, tenderness, lesion or injury on the right labia. There is no rash, tenderness, lesion or injury on the left labia. Cervix exhibits discharge. Cervix exhibits no motion tenderness and no friability. Right adnexum displays no mass, no tenderness and no fullness. Left adnexum displays no mass, no  tenderness and no fullness.  Musculoskeletal: Normal range of motion.  Lymphadenopathy:    She has no cervical adenopathy.  Neurological: She is alert and oriented to person, place, and time.  Skin: Skin is warm and dry.  Psychiatric: She has a normal mood and affect. Her behavior is normal. Judgment and thought content normal.    ED Course  Procedures (including critical care time) Labs Review Labs Reviewed  URINE CULTURE  WET PREP, GENITAL  URINALYSIS, ROUTINE W REFLEX MICROSCOPIC   Imaging Review Dg Chest 2 View  09/07/2013   CLINICAL DATA:  Hemoptysis, chest pain  EXAM: CHEST  2 VIEW  COMPARISON:  09/04/2010  FINDINGS: The heart size and mediastinal contours are within normal limits. Both lungs are clear. The visualized skeletal structures are unremarkable. Nipple shadows are noted bilaterally.  IMPRESSION: No active cardiopulmonary disease.   Electronically Signed   By: Alcide Clever M.D.   On: 09/07/2013 08:19    EKG Interpretation   None       MDM   1. Trichimoniasis    Recently treated for GC/chlamydia. Now has +wet prep for trichomoniasis. No abdominal or pelvic pain. Denies fever, chills, vomiting or nausea. Pelvic exam reassuring, no symptoms of PID. Metronidazole 500mg  bid x7d. Instructions not to drink alcohol until 24 hrs after last dose.      Irish Elders, NP 09/09/13 [phone]

## 2013-09-10 LAB — URINE CULTURE: Special Requests: NORMAL

## 2013-09-10 NOTE — ED Provider Notes (Signed)
Medical screening examination/treatment/procedure(s) were performed by non-physician practitioner and as supervising physician I was immediately available for consultation/collaboration.  EKG Interpretation   None        Kess Mcilwain, MD 09/10/13 0736 

## 2013-10-16 ENCOUNTER — Encounter (HOSPITAL_COMMUNITY): Payer: Self-pay | Admitting: Emergency Medicine

## 2013-10-16 ENCOUNTER — Other Ambulatory Visit (HOSPITAL_COMMUNITY)
Admission: RE | Admit: 2013-10-16 | Discharge: 2013-10-16 | Disposition: A | Discharge status: Home / Self Care | Payer: BC Managed Care – HMO | Source: Ambulatory Visit | Attending: Emergency Medicine | Admitting: Emergency Medicine

## 2013-10-16 ENCOUNTER — Emergency Department (HOSPITAL_COMMUNITY)
Admission: EM | Admit: 2013-10-16 | Discharge: 2013-10-16 | Disposition: A | Discharge status: Home / Self Care | Payer: BC Managed Care – HMO | Source: Home / Self Care | Attending: Family Medicine | Admitting: Family Medicine

## 2013-10-16 DIAGNOSIS — N760 Acute vaginitis: Secondary | ICD-10-CM | POA: Insufficient documentation

## 2013-10-16 DIAGNOSIS — N898 Other specified noninflammatory disorders of vagina: Secondary | ICD-10-CM

## 2013-10-16 DIAGNOSIS — Z113 Encounter for screening for infections with a predominantly sexual mode of transmission: Secondary | ICD-10-CM | POA: Insufficient documentation

## 2013-10-16 LAB — POCT URINALYSIS DIP (DEVICE)
Bilirubin Urine: NEGATIVE
Glucose, UA: NEGATIVE mg/dL
Hgb urine dipstick: NEGATIVE
Leukocytes, UA: NEGATIVE
Nitrite: NEGATIVE
Specific Gravity, Urine: 1.025 (ref 1.005–1.030)
pH: 6 (ref 5.0–8.0)

## 2013-10-16 NOTE — ED Provider Notes (Signed)
CSN: [id number]     Arrival date & time 10/16/13  1529 History   First MD Initiated Contact with Patient 10/16/13 1651     Chief Complaint  Patient presents with  . Abdominal Pain   (Consider location/radiation/quality/duration/timing/severity/associated sxs/prior Treatment) HPI Comments: Patient reports that she has been receiving Depo Provera injects for past 16 years and on occasion she has had similar episodes of brown ("old blood") discharge. She states that this episode just seems to be lasting longer and discharge is a bit heavier. Denies fever, dysuria, abdominal pain. States she has had sporadic pelvic cramping over the past 5-7 days. No N/V/D/C. States she has been using ibuprofen for cramping and this has successfully treated her discomfort. Next Depo injection not due until Feb. 2015  Patient is a 32 y.o. female presenting with vaginal discharge. The history is provided by the patient.  Vaginal Discharge Quality:  Manson Passey and bloody Severity:  Moderate Onset quality:  Gradual Duration:  1 week Timing:  Intermittent Progression:  Improving Chronicity:  New Associated symptoms: no dysuria     Past Medical History  Diagnosis Date  . Headache(784.0)   . Abnormal Pap smear   . Gonorrhea   . Chlamydia   . Trichomonas   . HPV (human papilloma virus) infection   . Urinary tract infection    Past Surgical History  Procedure Laterality Date  . Colposcopy  2010  . Leep    . Cesarean section  08/31/2011    Procedure: CESAREAN SECTION;  Surgeon: Roseanna Rainbow, MD;  Location: [facility];  Service: Gynecology;  Laterality: N/A;  Primary Cesarean section Baby Boy @ 1418   No family history on file. History  Substance Use Topics  . Smoking status: Current Every Day Smoker -- 5 years    Types: Cigarettes  . Smokeless tobacco: Never Used     Comment: quit with + preg  . Alcohol Use: No   OB History   Grav Para Term Preterm Abortions TAB SAB Ect Mult Living   3 3 2 1      3       Review of Systems  Constitutional: Negative.   HENT: Negative.   Eyes: Negative.   Respiratory: Negative.   Cardiovascular: Negative.   Gastrointestinal: Negative.   Endocrine: Negative for polydipsia, polyphagia and polyuria.  Genitourinary: Positive for vaginal bleeding, vaginal discharge and pelvic pain. Negative for dysuria, frequency, hematuria, flank pain, decreased urine volume and vaginal pain.  Musculoskeletal: Negative.   Skin: Negative.   Allergic/Immunologic: Negative for immunocompromised state.  Neurological: Negative.   Hematological: Negative for adenopathy. Does not bruise/bleed easily.  Psychiatric/Behavioral: Negative.     Allergies  Review of patient's allergies indicates no known allergies.  Home Medications   Current Outpatient Rx  Name  Route  Sig  Dispense  Refill  . ibuprofen (ADVIL,MOTRIN) 600 MG tablet   Oral   Take 1 tablet (600 mg total) by mouth every 6 (six) hours as needed.   30 tablet   0   . medroxyPROGESTERone (DEPO-PROVERA) 150 MG/ML injection   Intramuscular   Inject 150 mg into the muscle every 3 (three) months.         . metroNIDAZOLE (FLAGYL) 500 MG tablet   Oral   Take 1 tablet (500 mg total) by mouth 2 (two) times daily.   14 tablet   0    BP 107/71  Pulse 82  Temp(Src) 99.4 F (37.4 C) (Oral)  Resp 14  SpO2 100%  Physical Exam  Nursing note and vitals reviewed. Constitutional: She is oriented to person, place, and time. She appears well-developed and well-nourished. No distress.  HENT:  Head: Normocephalic and atraumatic.  Eyes: Conjunctivae are normal. Pupils are equal, round, and reactive to light. No scleral icterus.  Neck: Normal range of motion. Neck supple.  Cardiovascular: Normal rate, regular rhythm and normal heart sounds.   Pulmonary/Chest: Effort normal and breath sounds normal.  Abdominal: Soft. Bowel sounds are normal. She exhibits no distension and no mass. There is no tenderness. There is no  rebound and no guarding.  Genitourinary: Uterus normal. Pelvic exam was performed with patient supine. There is no rash, tenderness, lesion or injury on the right labia. There is no rash, tenderness or injury on the left labia. Cervix exhibits no motion tenderness, no discharge and no friability. Right adnexum displays no mass, no tenderness and no fullness. Left adnexum displays no mass, no tenderness and no fullness. No erythema, tenderness or bleeding around the vagina. No foreign body around the vagina. Vaginal discharge found.  +moderate amount (clumps) of thick brown material in vaginal vault.   Musculoskeletal: Normal range of motion.  Neurological: She is alert and oriented to person, place, and time.  Skin: Skin is warm and dry.  Psychiatric: She has a normal mood and affect. Her behavior is normal.    ED Course  Procedures (including critical care time) Labs Review Labs Reviewed  POCT URINALYSIS DIP (DEVICE)  POCT PREGNANCY, URINE  CERVICOVAGINAL ANCILLARY ONLY   Imaging Review No results found.  EKG Interpretation    Date/Time:    Ventricular Rate:    PR Interval:    QRS Duration:   QT Interval:    QTC Calculation:   R Axis:     Text Interpretation:              MDM  Exam suggests that she is finishing a menstrual cycle with thick brown material retained in vaginal vault. Discharge cleared with swabs during exam and cervix appears normal. UPT negative and UA normal. We discussed that on occasion, irregular menstrual bleeding can be caused by vaginal infections. We discussed empiric treatment for GC and chlamydia and patient stated she would like to await culture results to begin any treatment. She was made aware that if vaginal culture results indicate the need for treatment, she will be notified by phone and prescriptions called to her pharmacy. Advised if symptoms become suddenly worse or severe or she develops abdominal pain, she should be re-evaluated at her  nearest ER.    Jess Barters [place], [place] 10/16/13 [phone]

## 2013-10-16 NOTE — ED Notes (Signed)
Abdominal pain x 1 week 

## 2013-10-17 NOTE — ED Provider Notes (Signed)
Medical screening examination/treatment/procedure(s) were performed by resident physician or non-physician practitioner and as supervising physician I was immediately available for consultation/collaboration.   Barkley Bruns MD.   Linna Hoff, MD 10/17/13 [phone]

## 2013-10-20 ENCOUNTER — Encounter (HOSPITAL_COMMUNITY): Payer: Self-pay | Admitting: Emergency Medicine

## 2013-10-20 ENCOUNTER — Emergency Department (HOSPITAL_COMMUNITY)
Admission: EM | Admit: 2013-10-20 | Discharge: 2013-10-20 | Disposition: A | Discharge status: Home / Self Care | Payer: BC Managed Care – HMO | Attending: Emergency Medicine | Admitting: Emergency Medicine

## 2013-10-20 DIAGNOSIS — Z87440 Personal history of urinary (tract) infections: Secondary | ICD-10-CM | POA: Insufficient documentation

## 2013-10-20 DIAGNOSIS — IMO0001 Reserved for inherently not codable concepts without codable children: Secondary | ICD-10-CM | POA: Insufficient documentation

## 2013-10-20 DIAGNOSIS — J111 Influenza due to unidentified influenza virus with other respiratory manifestations: Secondary | ICD-10-CM

## 2013-10-20 DIAGNOSIS — Z8619 Personal history of other infectious and parasitic diseases: Secondary | ICD-10-CM | POA: Insufficient documentation

## 2013-10-20 DIAGNOSIS — F17200 Nicotine dependence, unspecified, uncomplicated: Secondary | ICD-10-CM | POA: Insufficient documentation

## 2013-10-20 DIAGNOSIS — Z79899 Other long term (current) drug therapy: Secondary | ICD-10-CM | POA: Insufficient documentation

## 2013-10-20 MED ORDER — OSELTAMIVIR PHOSPHATE 75 MG PO CAPS: 75.0000 mg | ORAL_CAPSULE | Freq: Two times a day (BID) | ORAL | Status: DC

## 2013-10-20 NOTE — ED Notes (Signed)
Per pt, states flu like symptoms started yesterday, chills, congestion

## 2013-10-20 NOTE — ED Provider Notes (Signed)
CSN: [id number]     Arrival date & time 10/20/13  4098 History   First MD Initiated Contact with Patient 10/20/13 0755     Chief Complaint  Patient presents with  . chest congestion   . flu like symptoms    (Consider location/radiation/quality/duration/timing/severity/associated sxs/prior Treatment) Patient is a 32 y.o. female presenting with flu symptoms.  Influenza Presenting symptoms: cough, fever, headache and myalgias   Presenting symptoms: no shortness of breath   Cough:    Cough characteristics:  Productive   Sputum characteristics:  Green Fever:    Max temp PTA (F):  103 Myalgias:    Location:  Generalized Severity:  Severe Onset quality:  Sudden Duration:  24 hours Progression:  Unchanged Chronicity:  New Relieved by: tylenol. Associated symptoms: chills and nasal congestion     Past Medical History  Diagnosis Date  . Headache(784.0)   . Abnormal Pap smear   . Gonorrhea   . Chlamydia   . Trichomonas   . HPV (human papilloma virus) infection   . Urinary tract infection    Past Surgical History  Procedure Laterality Date  . Colposcopy  2010  . Leep    . Cesarean section  08/31/2011    Procedure: CESAREAN SECTION;  Surgeon: Roseanna Rainbow, MD;  Location: [facility];  Service: Gynecology;  Laterality: N/A;  Primary Cesarean section Baby Boy @ 1418   No family history on file. History  Substance Use Topics  . Smoking status: Current Every Day Smoker -- 5 years    Types: Cigarettes  . Smokeless tobacco: Never Used     Comment: quit with + preg  . Alcohol Use: No   OB History   Grav Para Term Preterm Abortions TAB SAB Ect Mult Living   3 3 2 1      3      Review of Systems  Constitutional: Positive for fever and chills.  HENT: Positive for congestion.   Respiratory: Positive for cough. Negative for shortness of breath.   Musculoskeletal: Positive for myalgias.  Neurological: Positive for headaches.  All other systems reviewed and are  negative.    Allergies  Review of patient's allergies indicates no known allergies.  Home Medications   Current Outpatient Rx  Name  Route  Sig  Dispense  Refill  . medroxyPROGESTERone (DEPO-PROVERA) 150 MG/ML injection   Intramuscular   Inject 150 mg into the muscle every 3 (three) months.         . metroNIDAZOLE (FLAGYL) 500 MG tablet   Oral   Take 1 tablet (500 mg total) by mouth 2 (two) times daily.   14 tablet   0    BP 104/66  Pulse 99  Temp(Src) 100.4 F (38 C) (Oral)  Resp 14  SpO2 100% Physical Exam  Nursing note and vitals reviewed. Constitutional: She is oriented to person, place, and time. She appears well-developed and well-nourished. No distress.  HENT:  Head: Normocephalic and atraumatic.  Mouth/Throat: Oropharynx is clear and moist.  Eyes: Conjunctivae are normal. Pupils are equal, round, and reactive to light. No scleral icterus.  Neck: Neck supple. No Brudzinski's sign and no Kernig's sign noted.  Cardiovascular: Normal rate, regular rhythm, normal heart sounds and intact distal pulses.   No murmur heard. Pulmonary/Chest: Effort normal and breath sounds normal. No stridor. No respiratory distress. She has no rales.  Abdominal: Soft. Bowel sounds are normal. She exhibits no distension. There is no tenderness.  Musculoskeletal: Normal range of motion.  Neurological: She  is alert and oriented to person, place, and time.  Skin: Skin is warm and dry. No rash noted.  Psychiatric: She has a normal mood and affect. Her behavior is normal.    ED Course  Procedures (including critical care time) Labs Review Labs Reviewed - No data to display Imaging Review No results found.  EKG Interpretation   None       MDM   1. Influenza-like illness    32 yo female with flu like symptoms.  Nontoxic.  Has young children at home, so will prescribe oseltamivir.      Candyce Churn, MD 10/20/13 [phone]

## 2014-03-17 ENCOUNTER — Emergency Department (HOSPITAL_COMMUNITY)
Admission: EM | Admit: 2014-03-17 | Discharge: 2014-03-17 | Disposition: A | Discharge status: Home / Self Care | Payer: BC Managed Care – HMO | Attending: Emergency Medicine | Admitting: Emergency Medicine

## 2014-03-17 ENCOUNTER — Encounter (HOSPITAL_COMMUNITY): Payer: Self-pay | Admitting: Emergency Medicine

## 2014-03-17 DIAGNOSIS — Z792 Long term (current) use of antibiotics: Secondary | ICD-10-CM | POA: Insufficient documentation

## 2014-03-17 DIAGNOSIS — Z3202 Encounter for pregnancy test, result negative: Secondary | ICD-10-CM | POA: Insufficient documentation

## 2014-03-17 DIAGNOSIS — Z9889 Other specified postprocedural states: Secondary | ICD-10-CM | POA: Insufficient documentation

## 2014-03-17 DIAGNOSIS — M545 Low back pain, unspecified: Secondary | ICD-10-CM | POA: Insufficient documentation

## 2014-03-17 DIAGNOSIS — M549 Dorsalgia, unspecified: Secondary | ICD-10-CM

## 2014-03-17 DIAGNOSIS — Z87440 Personal history of urinary (tract) infections: Secondary | ICD-10-CM | POA: Insufficient documentation

## 2014-03-17 DIAGNOSIS — Z8619 Personal history of other infectious and parasitic diseases: Secondary | ICD-10-CM | POA: Insufficient documentation

## 2014-03-17 DIAGNOSIS — F17200 Nicotine dependence, unspecified, uncomplicated: Secondary | ICD-10-CM | POA: Insufficient documentation

## 2014-03-17 LAB — URINALYSIS, ROUTINE W REFLEX MICROSCOPIC
BILIRUBIN URINE: NEGATIVE
Glucose, UA: NEGATIVE mg/dL
KETONES UR: NEGATIVE mg/dL
NITRITE: NEGATIVE
Protein, ur: NEGATIVE mg/dL
SPECIFIC GRAVITY, URINE: 1.021 (ref 1.005–1.030)
UROBILINOGEN UA: 2 mg/dL — AB (ref 0.0–1.0)
pH: 6.5 (ref 5.0–8.0)

## 2014-03-17 LAB — I-STAT CHEM 8, ED
BUN: 16 mg/dL (ref 6–23)
CREATININE: 1 mg/dL (ref 0.50–1.10)
Calcium, Ion: 1.2 mmol/L (ref 1.12–1.23)
Chloride: 103 mEq/L (ref 96–112)
GLUCOSE: 92 mg/dL (ref 70–99)
HCT: 39 % (ref 36.0–46.0)
HEMOGLOBIN: 13.3 g/dL (ref 12.0–15.0)
Potassium: 4.5 mEq/L (ref 3.7–5.3)
Sodium: 140 mEq/L (ref 137–147)
TCO2: 25 mmol/L (ref 0–100)

## 2014-03-17 LAB — PREGNANCY, URINE: Preg Test, Ur: NEGATIVE

## 2014-03-17 LAB — URINE MICROSCOPIC-ADD ON

## 2014-03-17 MED ORDER — TRAMADOL HCL 50 MG PO TABS: 50.0000 mg | ORAL_TABLET | Freq: Four times a day (QID) | ORAL | Status: DC | PRN

## 2014-03-17 MED ORDER — CIPROFLOXACIN HCL 500 MG PO TABS: 500.0000 mg | ORAL_TABLET | Freq: Two times a day (BID) | ORAL | Status: DC

## 2014-03-17 NOTE — ED Provider Notes (Signed)
CSN: [id number]     Arrival date & time 03/17/14  2052 History   First MD Initiated Contact with Patient 03/17/14 2154     Chief Complaint  Patient presents with  . Abdominal Cramping     (Consider location/radiation/quality/duration/timing/severity/associated sxs/prior Treatment) Patient is a 33 y.o. female presenting with back pain. The history is provided by the patient (the pt complains of back pain).  Back Pain Location:  Lumbar spine Quality:  Aching Radiates to:  Does not radiate Pain severity:  Moderate Pain is:  Same all the time Onset quality:  Gradual Timing:  Constant Progression:  Unchanged Chronicity:  New Context: not emotional stress   Associated symptoms: no abdominal pain, no chest pain and no headaches     Past Medical History  Diagnosis Date  . Headache(784.0)   . Abnormal Pap smear   . Gonorrhea   . Chlamydia   . Trichomonas   . HPV (human papilloma virus) infection   . Urinary tract infection    Past Surgical History  Procedure Laterality Date  . Colposcopy  2010  . Leep    . Cesarean section  08/31/2011    Procedure: CESAREAN SECTION;  Surgeon: Agnes Lawrence, MD;  Location: [facility];  Service: Gynecology;  Laterality: N/A;  Primary Cesarean section Baby Boy @ 1418   History reviewed. No pertinent family history. History  Substance Use Topics  . Smoking status: Current Every Day Smoker -- 5 years    Types: Cigarettes  . Smokeless tobacco: Never Used     Comment: quit with + preg  . Alcohol Use: No   OB History   Grav Para Term Preterm Abortions TAB SAB Ect Mult Living   3 3 2 1      3      Review of Systems  Constitutional: Negative for appetite change and fatigue.  HENT: Negative for congestion, ear discharge and sinus pressure.   Eyes: Negative for discharge.  Respiratory: Negative for cough.   Cardiovascular: Negative for chest pain.  Gastrointestinal: Negative for abdominal pain and diarrhea.  Genitourinary: Positive for  flank pain. Negative for frequency and hematuria.  Musculoskeletal: Positive for back pain.  Skin: Negative for rash.  Neurological: Negative for seizures and headaches.  Psychiatric/Behavioral: Negative for hallucinations.      Allergies  Review of patient's allergies indicates no known allergies.  Home Medications   Prior to Admission medications   Medication Sig Start Date End Date Taking? Authorizing Provider  ibuprofen (ADVIL,MOTRIN) 200 MG tablet Take 200 mg by mouth every 6 (six) hours as needed (fever/pain).   Yes Historical Provider, MD  ciprofloxacin (CIPRO) 500 MG tablet Take 1 tablet (500 mg total) by mouth 2 (two) times daily. One po bid x 7 days 03/17/14   Maudry Diego, MD  medroxyPROGESTERone (DEPO-PROVERA) 150 MG/ML injection Inject 150 mg into the muscle every 3 (three) months.    Historical Provider, MD  traMADol (ULTRAM) 50 MG tablet Take 1 tablet (50 mg total) by mouth every 6 (six) hours as needed. 03/17/14   Maudry Diego, MD   BP 113/67  Pulse 82  Temp(Src) 98.7 F (37.1 C) (Oral)  Resp 16  SpO2 100% Physical Exam  Constitutional: She is oriented to person, place, and time. She appears well-developed.  HENT:  Head: Normocephalic.  Eyes: Conjunctivae and EOM are normal. No scleral icterus.  Neck: Neck supple. No thyromegaly present.  Cardiovascular: Normal rate and regular rhythm.  Exam reveals no gallop and no  friction rub.   No murmur heard. Pulmonary/Chest: No stridor. She has no wheezes. She has no rales. She exhibits no tenderness.  Abdominal: She exhibits no distension. There is no tenderness. There is no rebound.  Musculoskeletal: Normal range of motion. She exhibits no edema.  Tender right flank  Lymphadenopathy:    She has no cervical adenopathy.  Neurological: She is oriented to person, place, and time. She exhibits normal muscle tone. Coordination normal.  Skin: No rash noted. No erythema.  Psychiatric: She has a normal mood and affect.  Her behavior is normal.    ED Course  Procedures (including critical care time) Labs Review Labs Reviewed  URINALYSIS, ROUTINE W REFLEX MICROSCOPIC - Abnormal; Notable for the following:    Hgb urine dipstick TRACE (*)    Urobilinogen, UA 2.0 (*)    Leukocytes, UA TRACE (*)    All other components within normal limits  URINE CULTURE  PREGNANCY, URINE  URINE MICROSCOPIC-ADD ON  I-STAT CHEM 8, ED    Imaging Review No results found.   EKG Interpretation None      MDM   Final diagnoses:  Back pain      Maudry Diego, MD 03/17/14 2303

## 2014-03-17 NOTE — ED Notes (Signed)
Patient is alert and orineted x3.  She is complaining of cramping in her back that radiates around to Her abdominal area.  Currently she rates her pain 6 of 10.  She denies any issue like this before.

## 2014-03-17 NOTE — Discharge Instructions (Signed)
Follow up with your md this week for recheck  °

## 2014-03-19 LAB — URINE CULTURE

## 2014-05-02 ENCOUNTER — Encounter (HOSPITAL_BASED_OUTPATIENT_CLINIC_OR_DEPARTMENT_OTHER): Payer: Self-pay | Admitting: Emergency Medicine

## 2014-05-02 ENCOUNTER — Emergency Department (HOSPITAL_BASED_OUTPATIENT_CLINIC_OR_DEPARTMENT_OTHER)
Admission: EM | Admit: 2014-05-02 | Discharge: 2014-05-03 | Disposition: A | Discharge status: Home / Self Care | Payer: BC Managed Care – HMO | Attending: Emergency Medicine | Admitting: Emergency Medicine

## 2014-05-02 DIAGNOSIS — Z87440 Personal history of urinary (tract) infections: Secondary | ICD-10-CM | POA: Insufficient documentation

## 2014-05-02 DIAGNOSIS — Z792 Long term (current) use of antibiotics: Secondary | ICD-10-CM | POA: Insufficient documentation

## 2014-05-02 DIAGNOSIS — Z9889 Other specified postprocedural states: Secondary | ICD-10-CM | POA: Insufficient documentation

## 2014-05-02 DIAGNOSIS — K006 Disturbances in tooth eruption: Secondary | ICD-10-CM | POA: Insufficient documentation

## 2014-05-02 DIAGNOSIS — K011 Impacted teeth: Secondary | ICD-10-CM

## 2014-05-02 DIAGNOSIS — F17200 Nicotine dependence, unspecified, uncomplicated: Secondary | ICD-10-CM | POA: Insufficient documentation

## 2014-05-02 DIAGNOSIS — Z8619 Personal history of other infectious and parasitic diseases: Secondary | ICD-10-CM | POA: Insufficient documentation

## 2014-05-02 MED ORDER — HYDROCODONE-ACETAMINOPHEN 5-325 MG PO TABS
1.0000 | ORAL_TABLET | Freq: Once | ORAL | Status: AC
  Administered 2014-05-03: 1 via ORAL
  Filled 2014-05-02: qty 1

## 2014-05-02 NOTE — ED Provider Notes (Signed)
CSN: [id number]     Arrival date & time 05/02/14  2137 History  This chart was scribed for Julianne Rice, MD by Vernell Barrier, ED scribe. This patient was seen in room MH10/MH10 and the patient's care was started at 11:25 PM.    Chief Complaint  Patient presents with  . Dental Pain    The history is provided by the patient. No language interpreter was used.   HPI Comments: [name] is a 33 y.o. female who presents to the Emergency Department complaining of left upper dental pain w/ associated swelling; onset 3 days ago. Patient states she's had the pain for several weeks but is worse in the last few days. No prior dental procedures or injuries at the site of pain. States she has scheduled an appointment with a dentist but will not be seen for a while. She's had no fever or chills.  Past Medical History  Diagnosis Date  . Headache(784.0)   . Abnormal Pap smear   . Gonorrhea   . Chlamydia   . Trichomonas   . HPV (human papilloma virus) infection   . Urinary tract infection    Past Surgical History  Procedure Laterality Date  . Colposcopy  2010  . Leep    . Cesarean section  08/31/2011    Procedure: CESAREAN SECTION;  Surgeon: Agnes Lawrence, MD;  Location: [facility];  Service: Gynecology;  Laterality: N/A;  Primary Cesarean section Baby Boy @ 45   No family history on file. History  Substance Use Topics  . Smoking status: Current Every Day Smoker -- 5 years    Types: Cigarettes  . Smokeless tobacco: Never Used     Comment: quit with + preg  . Alcohol Use: No   OB History   Grav Para Term Preterm Abortions TAB SAB Ect Mult Living   3 3 2 1      3      Review of Systems  Constitutional: Negative for fever and chills.  HENT: Positive for dental problem.   All other systems reviewed and are negative.  Allergies  Review of patient's allergies indicates no known allergies.  Home Medications   Prior to Admission medications   Medication Sig Start Date  End Date Taking? Authorizing Provider  ciprofloxacin (CIPRO) 500 MG tablet Take 1 tablet (500 mg total) by mouth 2 (two) times daily. One po bid x 7 days 03/17/14   Maudry Diego, MD  ibuprofen (ADVIL,MOTRIN) 200 MG tablet Take 200 mg by mouth every 6 (six) hours as needed (fever/pain).    Historical Provider, MD  medroxyPROGESTERone (DEPO-PROVERA) 150 MG/ML injection Inject 150 mg into the muscle every 3 (three) months.    Historical Provider, MD  traMADol (ULTRAM) 50 MG tablet Take 1 tablet (50 mg total) by mouth every 6 (six) hours as needed. 03/17/14   Maudry Diego, MD   Triage vitals: BP 106/61  Pulse 91  Temp(Src) 98.1 F (36.7 C) (Oral)  Resp 18  Ht 5\' 8"  (1.727 m)  Wt 151 lb (68.493 kg)  BMI 22.96 kg/m2  SpO2 99%  Physical Exam  Nursing note and vitals reviewed. Constitutional: She is oriented to person, place, and time. She appears well-developed and well-nourished. No distress.  HENT:  Head: Normocephalic and atraumatic.  Mouth/Throat:    Eyes: Conjunctivae and EOM are normal.  Neck: Neck supple. No tracheal deviation present.  Cardiovascular: Normal rate.   Pulmonary/Chest: Effort normal. No respiratory distress.  Musculoskeletal: Normal range of motion.  Neurological: She is alert and oriented to person, place, and time.  Skin: Skin is warm and dry.  Psychiatric: She has a normal mood and affect. Her behavior is normal.    ED Course  Procedures (including critical care time) DIAGNOSTIC STUDIES: Oxygen Saturation is 99% on room air, normal by my interpretation.    COORDINATION OF CARE: At 11:26 PM: Discussed treatment plan with patient which includes pain medication and referral to a dentist. Patient agrees.   Labs Review Labs Reviewed - No data to display  Imaging Review No results found.   EKG Interpretation None      MDM   Final diagnoses:  None    I personally performed the services described in this documentation, which was scribed in my  presence. The recorded information has been reviewed and is accurate.   Patient advised to followup with oral surgery. She appears to have an impacted molar. I see no evidence of infection. Return precautions given.  Julianne Rice, MD 05/03/14 [phone]

## 2014-05-02 NOTE — ED Notes (Signed)
C/o  Left upper toothache x 3 days

## 2014-05-02 NOTE — ED Notes (Signed)
C/o left upper tooth pain since Monday,   Takes ibu w minor relief

## 2014-05-03 MED ORDER — HYDROCODONE-ACETAMINOPHEN 5-325 MG PO TABS: 1.0000 | ORAL_TABLET | ORAL | Status: DC | PRN

## 2014-05-03 MED ORDER — IBUPROFEN 600 MG PO TABS: 600.0000 mg | ORAL_TABLET | Freq: Four times a day (QID) | ORAL | Status: DC | PRN

## 2014-05-03 NOTE — Discharge Instructions (Signed)
Impacted Molar Molars are the teeth in the back of your mouth. You have 12 molars. There are 6 molars in each jaw, 3 on each side. When they grow in (erupt) they sometimes cause problems. Molars trapped inside the gum are impacted molars. Impacted molars may grow sideways, tilted, or may only partially emerge. Molars erupt at different times in life. The first set of molars usually erupts around 23 to 33 years of age. The second set of molars typically erupts around 79 to 33 years of age. The third set of molars are called wisdom teeth. These molars usually do not have enough space to erupt properly. Many teens and young adults develop impacted wisdom teeth and have them surgically removed (extracted). However, any molar or set of molars may become impacted. CAUSES  Teeth that are crowded are often the reason for an impacted molar, but sometimes a cyst or tumor may cause impaction of molars. SYMPTOMS  Sometimes there are no symptoms and an impacted molar is noticed during an exam or X-ray. If there are symptoms they may include:  Pain.  Swelling, redness, or inflammation near the impacted tooth or teeth.  Stiff jaw.  General feeling of illness.  Bad breath.  Gap between the teeth.  Difficulty opening your mouth.  Headache or jaw ache.  Swollen lymph nodes. Impacted teeth may increase the risk of complications such as:  Infection, with possible drainage around the infected area.  Damage to nearby teeth.  Growth of cysts.  Chronic discomfort. DIAGNOSIS  Impacted molars are diagnosed by oral exam and X-rays. TREATMENT  The goal of treatment is to obtain the best possible arrangement of your teeth. Your dentist or orthodontist will recommend the best course of action for you. After an exam, your caregiver may recommend one or a combination of the following treatments.  Supportive home care to manage pain and other symptoms until treatment can be started.  Surgical extraction of  one or a combination of molars to leave room for emerging or later molars. Teeth must be extracted at appropriate times for the best results.  Surgical uncovering of tissue covering the impacted molar.  Orthodontic repositioning with the use of appliances such as elastic or metal separators, braces, wires, springs, and other removable or fixed devices. This is done to guide the molar and surrounding teeth to grow in properly. In some cases, you may need some surgery to assist this procedure. Follow-up orthodontic treatment is often necessary with impacted first and second molars.  Antibiotics to treat infection. HOME CARE INSTRUCTIONS Rinse as directed with an antibacterial solution or salt and warm water. Follow up with your caregiver as directed, even if you do not have symptoms. If you are waiting for treatment and have pain:  Take pain medicines as directed.  Take your antibiotics as directed. Finish them even if you start to feel better.  Put ice on the affected area.  Put ice in a plastic bag.  Place a towel between your skin and the bag.  Leave the ice on for 15-20 minutes, 03-04 times a day. SEEK DENTAL CARE IF:  You have a fever.  Pain emerges, worsens, or is not controlled by the medicines you were given.  Swelling occurs.  You have difficulty opening your mouth or swallowing. MAKE SURE YOU:   Understand these instructions.  Will watch your condition.  Will get help right away if you are not doing well or get worse. Document Released: 06/10/2011 Document Revised: 01/04/2012 Document Reviewed:  06/10/2011 [employer] Patient Information 2015 [place], [place]. This information is not intended to replace advice given to you by your health care provider. Make sure you discuss any questions you have with your health care provider.

## 2014-08-27 ENCOUNTER — Encounter (HOSPITAL_BASED_OUTPATIENT_CLINIC_OR_DEPARTMENT_OTHER): Payer: Self-pay | Admitting: Emergency Medicine

## 2014-11-01 ENCOUNTER — Emergency Department (HOSPITAL_COMMUNITY)
Admission: EM | Admit: 2014-11-01 | Discharge: 2014-11-01 | Disposition: A | Discharge status: Home / Self Care | Payer: Self-pay | Attending: Emergency Medicine | Admitting: Emergency Medicine

## 2014-11-01 ENCOUNTER — Encounter (HOSPITAL_COMMUNITY): Payer: Self-pay | Admitting: *Deleted

## 2014-11-01 DIAGNOSIS — J028 Acute pharyngitis due to other specified organisms: Secondary | ICD-10-CM | POA: Insufficient documentation

## 2014-11-01 DIAGNOSIS — J029 Acute pharyngitis, unspecified: Secondary | ICD-10-CM

## 2014-11-01 DIAGNOSIS — Z79899 Other long term (current) drug therapy: Secondary | ICD-10-CM | POA: Insufficient documentation

## 2014-11-01 DIAGNOSIS — B9789 Other viral agents as the cause of diseases classified elsewhere: Secondary | ICD-10-CM | POA: Insufficient documentation

## 2014-11-01 DIAGNOSIS — R0981 Nasal congestion: Secondary | ICD-10-CM

## 2014-11-01 DIAGNOSIS — Z87440 Personal history of urinary (tract) infections: Secondary | ICD-10-CM | POA: Insufficient documentation

## 2014-11-01 DIAGNOSIS — Z720 Tobacco use: Secondary | ICD-10-CM | POA: Insufficient documentation

## 2014-11-01 DIAGNOSIS — Z792 Long term (current) use of antibiotics: Secondary | ICD-10-CM | POA: Insufficient documentation

## 2014-11-01 LAB — RAPID STREP SCREEN (MED CTR MEBANE ONLY): STREPTOCOCCUS, GROUP A SCREEN (DIRECT): NEGATIVE

## 2014-11-01 MED ORDER — HYDROCODONE-ACETAMINOPHEN 7.5-325 MG/15ML PO SOLN: 15.0000 mL | Freq: Four times a day (QID) | ORAL | Status: DC | PRN

## 2014-11-01 MED ORDER — SALINE SPRAY 0.65 % NA SOLN: 1.0000 | NASAL | Status: DC | PRN

## 2014-11-01 NOTE — ED Provider Notes (Signed)
CSN: [id number]     Arrival date & time 11/01/14  2137 History   First MD Initiated Contact with Patient 11/01/14 2149    This chart was scribed for non-physician practitioner, Antonietta Breach, [place], working with Tanna Furry, MD by Terressa Koyanagi, ED Scribe. This patient was seen in room WTR8/WTR8 and the patient's care was started at 10:19 PM.  Chief Complaint  Patient presents with  . Sore Throat   Patient is a 34 y.o. female presenting with pharyngitis. The history is provided by the patient. No language interpreter was used.  Sore Throat This is a new problem. The current episode started more than 2 days ago. The problem occurs hourly. The problem has not changed since onset.The symptoms are aggravated by eating. The symptoms are relieved by acetaminophen (cough drops ). She has tried acetaminophen for the symptoms. The treatment provided moderate relief.   PCP: No PCP Per Patient HPI Comments: [name] is a 34 y.o. female, with PMH noted below, who presents to the Emergency Department complaining of sore throat with associated cough, congestion, runny nose and postnasal drip onset one week ago. Pt reports taking tylenol and cough drops at home with some relief. Pt notes that her sore throat are worse in the morning than at night. Pt denies trouble swallowing, fever, sick contact.   Past Medical History  Diagnosis Date  . Headache(784.0)   . Abnormal Pap smear   . Gonorrhea   . Chlamydia   . Trichomonas   . HPV (human papilloma virus) infection   . Urinary tract infection    Past Surgical History  Procedure Laterality Date  . Colposcopy  2010  . Leep    . Cesarean section  08/31/2011    Procedure: CESAREAN SECTION;  Surgeon: Agnes Lawrence, MD;  Location: [facility];  Service: Gynecology;  Laterality: N/A;  Primary Cesarean section Baby Boy @ 38   No family history on file. History  Substance Use Topics  . Smoking status: Current Every Day Smoker -- 5 years    Types:  Cigarettes  . Smokeless tobacco: Never Used     Comment: quit with + preg  . Alcohol Use: No   OB History    Gravida Para Term Preterm AB TAB SAB Ectopic Multiple Living   3 3 2 1      3      Review of Systems  Constitutional: Negative for fever and chills.  HENT: Positive for congestion, postnasal drip, rhinorrhea and sore throat. Negative for trouble swallowing.   Respiratory: Positive for cough.   Psychiatric/Behavioral: Negative for confusion.  All other systems reviewed and are negative.  Allergies  Review of patient's allergies indicates no known allergies.  Home Medications   Prior to Admission medications   Medication Sig Start Date End Date Taking? Authorizing Provider  ciprofloxacin (CIPRO) 500 MG tablet Take 1 tablet (500 mg total) by mouth 2 (two) times daily. One po bid x 7 days 03/17/14   Maudry Diego, MD  HYDROcodone-acetaminophen (HYCET) 7.5-325 mg/15 ml solution Take 15 mLs by mouth every 6 (six) hours as needed for moderate pain or severe pain. 11/01/14   Antonietta Breach, PA-C  ibuprofen (ADVIL,MOTRIN) 200 MG tablet Take 200 mg by mouth every 6 (six) hours as needed (fever/pain).    Historical Provider, MD  ibuprofen (ADVIL,MOTRIN) 600 MG tablet Take 1 tablet (600 mg total) by mouth every 6 (six) hours as needed. 05/03/14   Julianne Rice, MD  medroxyPROGESTERone (DEPO-PROVERA) 150  MG/ML injection Inject 150 mg into the muscle every 3 (three) months.    Historical Provider, MD  sodium chloride (OCEAN) 0.65 % SOLN nasal spray Place 1 spray into both nostrils as needed for congestion. 11/01/14   Antonietta Breach, PA-C  traMADol (ULTRAM) 50 MG tablet Take 1 tablet (50 mg total) by mouth every 6 (six) hours as needed. 03/17/14   Maudry Diego, MD   Triage Vitals: BP 100/65 mmHg  Pulse 84  Temp(Src) 98.1 F (36.7 C) (Oral)  Resp 18  Ht 5\' 8"  (1.727 m)  Wt 145 lb (65.772 kg)  BMI 22.05 kg/m2  SpO2 99%  Physical Exam  Constitutional: She is oriented to person, place, and  time. She appears well-developed and well-nourished. No distress.  Nontoxic/nonseptic appearing  HENT:  Head: Normocephalic and atraumatic.  Right Ear: Tympanic membrane, external ear and ear canal normal.  Left Ear: Tympanic membrane, external ear and ear canal normal.  Nose: Nose normal.  Mouth/Throat: Uvula is midline and mucous membranes are normal. Posterior oropharyngeal erythema (Very mild) present. No oropharyngeal exudate or posterior oropharyngeal edema.  Very mild posterior oropharyngeal erythema. No tonsillar swelling or exudates. Uvula midline. No trismus. Patient tolerating secretions without difficulty. Audible nasal congestion appreciated.  Eyes: Conjunctivae and EOM are normal. Pupils are equal, round, and reactive to light. No scleral icterus.  Neck: Normal range of motion. Neck supple.  Very mild bilateral anterior cervical lymphadenopathy. No nuchal rigidity or meningismus.  Pulmonary/Chest: Effort normal. No respiratory distress.  Respirations even and unlabored  Musculoskeletal: Normal range of motion.  Lymphadenopathy:    She has cervical adenopathy.  Neurological: She is alert and oriented to person, place, and time. She exhibits normal muscle tone. Coordination normal.  Skin: Skin is warm and dry. No rash noted. She is not diaphoretic. No erythema. No pallor.  Psychiatric: She has a normal mood and affect. Her behavior is normal.  Nursing note and vitals reviewed.   ED Course  Procedures (including critical care time) DIAGNOSTIC STUDIES: Oxygen Saturation is 99% on RA, nl by my interpretation.    COORDINATION OF CARE: 10:22 PM-Discussed treatment plan with pt at bedside and pt agreed to plan.   Labs Review Labs Reviewed  RAPID STREP SCREEN  CULTURE, GROUP A STREP    Imaging Review No results found.   EKG Interpretation None      MDM   Final diagnoses:  Viral pharyngitis  Sinus congestion    Pt afebrile without tonsillar exudate. Presents  with mild cervical lymphadenopathy, nasal congestion, PND, and dysphagia; diagnosis of viral pharyngitis. No abx indicated. Will discharge with symptomatic tx for pain  Pt does not appear dehydrated, but did discuss importance of water rehydration. Presentation non concerning for PTA or infxn spread to soft tissue. No trismus or uvula deviation. Specific return precautions discussed. Pt tolerating secretions in ED without difficulty with intact air way. Recommended PCP follow up. Return precautions provided and patient agreeable to plan with no unaddressed concerns.  I personally performed the services described in this documentation, which was scribed in my presence. The recorded information has been reviewed and is accurate.   Filed Vitals:   11/01/14 2151  BP: 100/65  Pulse: 84  Temp: 98.1 F (36.7 C)  TempSrc: Oral  Resp: 18  Height: 5\' 8"  (1.727 m)  Weight: 145 lb (65.772 kg)  SpO2: 99%     Antonietta Breach, PA-C 11/01/14 2235  Tanna Furry, MD 11/13/14 0002

## 2014-11-01 NOTE — ED Notes (Signed)
Pt states has had a sore throat x 1 week, states hurts to swallow on both sides.

## 2014-11-01 NOTE — Discharge Instructions (Signed)
Recommend that you continue with supportive treatments. You may use Ocean nasal spray for congestion and Hycet as needed for worsening sore throat. Follow up with a primary care doctor for a recheck of symptoms in 1 week. Return, as needed, if symptoms worsen.  Pharyngitis Pharyngitis is redness, pain, and swelling (inflammation) of your pharynx.  CAUSES  Pharyngitis is usually caused by infection. Most of the time, these infections are from viruses (viral) and are part of a cold. However, sometimes pharyngitis is caused by bacteria (bacterial). Pharyngitis can also be caused by allergies. Viral pharyngitis may be spread from person to person by coughing, sneezing, and personal items or utensils (cups, forks, spoons, toothbrushes). Bacterial pharyngitis may be spread from person to person by more intimate contact, such as kissing.  SIGNS AND SYMPTOMS  Symptoms of pharyngitis include:   Sore throat.   Tiredness (fatigue).   Low-grade fever.   Headache.  Joint pain and muscle aches.  Skin rashes.  Swollen lymph nodes.  Plaque-like film on throat or tonsils (often seen with bacterial pharyngitis). DIAGNOSIS  Your health care provider will ask you questions about your illness and your symptoms. Your medical history, along with a physical exam, is often all that is needed to diagnose pharyngitis. Sometimes, a rapid strep test is done. Other lab tests may also be done, depending on the suspected cause.  TREATMENT  Viral pharyngitis will usually get better in 3-4 days without the use of medicine. Bacterial pharyngitis is treated with medicines that kill germs (antibiotics).  HOME CARE INSTRUCTIONS   Drink enough water and fluids to keep your urine clear or pale yellow.   Only take over-the-counter or prescription medicines as directed by your health care provider:   If you are prescribed antibiotics, make sure you finish them even if you start to feel better.   Do not take  aspirin.   Get lots of rest.   Gargle with 8 oz of salt water ( tsp of salt per 1 qt of water) as often as every 1-2 hours to soothe your throat.   Throat lozenges (if you are not at risk for choking) or sprays may be used to soothe your throat. SEEK MEDICAL CARE IF:   You have large, tender lumps in your neck.  You have a rash.  You cough up green, yellow-brown, or bloody spit. SEEK IMMEDIATE MEDICAL CARE IF:   Your neck becomes stiff.  You drool or are unable to swallow liquids.  You vomit or are unable to keep medicines or liquids down.  You have severe pain that does not go away with the use of recommended medicines.  You have trouble breathing (not caused by a stuffy nose). MAKE SURE YOU:   Understand these instructions.  Will watch your condition.  Will get help right away if you are not doing well or get worse. Document Released: 10/12/2005 Document Revised: 08/02/2013 Document Reviewed: 06/19/2013 [facility] Patient Information 2015 [place], [place]. This information is not intended to replace advice given to you by your health care provider. Make sure you discuss any questions you have with your health care provider. Salt Water Gargle This solution will help make your mouth and throat feel better. HOME CARE INSTRUCTIONS   Mix 1 teaspoon of salt in 8 ounces of warm water.  Gargle with this solution as much or often as you need or as directed. Swish and gargle gently if you have any sores or wounds in your mouth.  Do not swallow this mixture.  Document Released: 07/16/2004 Document Revised: 01/04/2012 Document Reviewed: 12/07/2008 [facility] Patient Information 2015 [place], [place]. This information is not intended to replace advice given to you by your health care provider. Make sure you discuss any questions you have with your health care provider.

## 2014-11-04 LAB — CULTURE, GROUP A STREP

## 2014-12-20 ENCOUNTER — Encounter (HOSPITAL_COMMUNITY): Payer: Self-pay | Admitting: Emergency Medicine

## 2014-12-20 ENCOUNTER — Emergency Department (HOSPITAL_COMMUNITY)
Admission: EM | Admit: 2014-12-20 | Discharge: 2014-12-20 | Disposition: A | Discharge status: Home / Self Care | Payer: BLUE CROSS/BLUE SHIELD | Attending: Emergency Medicine | Admitting: Emergency Medicine

## 2014-12-20 DIAGNOSIS — Z720 Tobacco use: Secondary | ICD-10-CM | POA: Insufficient documentation

## 2014-12-20 DIAGNOSIS — Z8619 Personal history of other infectious and parasitic diseases: Secondary | ICD-10-CM | POA: Insufficient documentation

## 2014-12-20 DIAGNOSIS — Z87440 Personal history of urinary (tract) infections: Secondary | ICD-10-CM | POA: Insufficient documentation

## 2014-12-20 DIAGNOSIS — M79605 Pain in left leg: Secondary | ICD-10-CM

## 2014-12-20 DIAGNOSIS — Z792 Long term (current) use of antibiotics: Secondary | ICD-10-CM | POA: Diagnosis not present

## 2014-12-20 DIAGNOSIS — R202 Paresthesia of skin: Secondary | ICD-10-CM | POA: Diagnosis not present

## 2014-12-20 DIAGNOSIS — M25562 Pain in left knee: Secondary | ICD-10-CM | POA: Diagnosis present

## 2014-12-20 MED ORDER — IBUPROFEN 800 MG PO TABS
800.0000 mg | ORAL_TABLET | Freq: Once | ORAL | Status: AC
  Administered 2014-12-20: 800 mg via ORAL
  Filled 2014-12-20: qty 1

## 2014-12-20 MED ORDER — IBUPROFEN 600 MG PO TABS: 600.0000 mg | ORAL_TABLET | Freq: Four times a day (QID) | ORAL | Status: DC | PRN

## 2014-12-20 NOTE — Discharge Instructions (Signed)
Paresthesia Paresthesia is an abnormal burning or prickling sensation. This sensation is generally felt in the hands, arms, legs, or feet. However, it may occur in any part of the body. It is usually not painful. The feeling may be described as:  Tingling or numbness.  "Pins and needles."  Skin crawling.  Buzzing.  Limbs "falling asleep."  Itching. Most people experience temporary (transient) paresthesia at some time in their lives. CAUSES  Paresthesia may occur when you breathe too quickly (hyperventilation). It can also occur without any apparent cause. Commonly, paresthesia occurs when pressure is placed on a nerve. The feeling quickly goes away once the pressure is removed. For some people, however, paresthesia is a long-lasting (chronic) condition caused by an underlying disorder. The underlying disorder may be:  A traumatic, direct injury to nerves. Examples include a:  Broken (fractured) neck.  Fractured skull.  A disorder affecting the brain and spinal cord (central nervous system). Examples include:  Transverse myelitis.  Encephalitis.  Transient ischemic attack.  Multiple sclerosis.  Stroke.  Tumor or blood vessel problems, such as an arteriovenous malformation pressing against the brain or spinal cord.  A condition that damages the peripheral nerves (peripheral neuropathy). Peripheral nerves are not part of the brain and spinal cord. These conditions include:  Diabetes.  Peripheral vascular disease.  Nerve entrapment syndromes, such as carpal tunnel syndrome.  Shingles.  Hypothyroidism.  Vitamin B12 deficiencies.  Alcoholism.  Heavy metal poisoning (lead, arsenic).  Rheumatoid arthritis.  Systemic lupus erythematosus. DIAGNOSIS  Your caregiver will attempt to find the underlying cause of your paresthesia. Your caregiver may:  Take your medical history.  Perform a physical exam.  Order various lab tests.  Order imaging tests. TREATMENT    Treatment for paresthesia depends on the underlying cause. HOME CARE INSTRUCTIONS  Avoid drinking alcohol.  You may consider massage or acupuncture to help relieve your symptoms.  Keep all follow-up appointments as directed by your caregiver. SEEK IMMEDIATE MEDICAL CARE IF:   You feel weak.  You have trouble walking or moving.  You have problems with speech or vision.  You feel confused.  You cannot control your bladder or bowel movements.  You feel numbness after an injury.  You faint.  Your burning or prickling feeling gets worse when walking.  You have pain, cramps, or dizziness.  You develop a rash. MAKE SURE YOU:  Understand these instructions.  Will watch your condition.  Will get help right away if you are not doing well or get worse. Document Released: 10/02/2002 Document Revised: 01/04/2012 Document Reviewed: 07/03/2011 [facility] Patient Information 2015 [place], [place]. This information is not intended to replace advice given to you by your health care provider. Make sure you discuss any questions you have with your health care provider. Knee Pain The knee is the complex joint between your thigh and your lower leg. It is made up of bones, tendons, ligaments, and cartilage. The bones that make up the knee are:  The femur in the thigh.  The tibia and fibula in the lower leg.  The patella or kneecap riding in the groove on the lower femur. CAUSES  Knee pain is a common complaint with many causes. A few of these causes are:  Injury, such as:  A ruptured ligament or tendon injury.  Torn cartilage.  Medical conditions, such as:  Gout  Arthritis  Infections  Overuse, over training, or overdoing a physical activity. Knee pain can be minor or severe. Knee pain can accompany debilitating injury.  Minor knee problems often respond well to self-care measures or get well on their own. More serious injuries may need medical intervention or even  surgery. SYMPTOMS The knee is complex. Symptoms of knee problems can vary widely. Some of the problems are:  Pain with movement and weight bearing.  Swelling and tenderness.  Buckling of the knee.  Inability to straighten or extend your knee.  Your knee locks and you cannot straighten it.  Warmth and redness with pain and fever.  Deformity or dislocation of the kneecap. DIAGNOSIS  Determining what is wrong may be very straight forward such as when there is an injury. It can also be challenging because of the complexity of the knee. Tests to make a diagnosis may include:  Your caregiver taking a history and doing a physical exam.  Routine X-rays can be used to rule out other problems. X-rays will not reveal a cartilage tear. Some injuries of the knee can be diagnosed by:  Arthroscopy a surgical technique by which a small video camera is inserted through tiny incisions on the sides of the knee. This procedure is used to examine and repair internal knee joint problems. Tiny instruments can be used during arthroscopy to repair the torn knee cartilage (meniscus).  Arthrography is a radiology technique. A contrast liquid is directly injected into the knee joint. Internal structures of the knee joint then become visible on X-ray film.  An MRI scan is a non X-ray radiology procedure in which magnetic fields and a computer produce two- or three-dimensional images of the inside of the knee. Cartilage tears are often visible using an MRI scanner. MRI scans have largely replaced arthrography in diagnosing cartilage tears of the knee.  Blood work.  Examination of the fluid that helps to lubricate the knee joint (synovial fluid). This is done by taking a sample out using a needle and a syringe. TREATMENT The treatment of knee problems depends on the cause. Some of these treatments are:  Depending on the injury, proper casting, splinting, surgery, or physical therapy care will be needed.  Give  yourself adequate recovery time. Do not overuse your joints. If you begin to get sore during workout routines, back off. Slow down or do fewer repetitions.  For repetitive activities such as cycling or running, maintain your strength and nutrition.  Alternate muscle groups. For example, if you are a weight lifter, work the upper body on one day and the lower body the next.  Either tight or weak muscles do not give the proper support for your knee. Tight or weak muscles do not absorb the stress placed on the knee joint. Keep the muscles surrounding the knee strong.  Take care of mechanical problems.  If you have flat feet, orthotics or special shoes may help. See your caregiver if you need help.  Arch supports, sometimes with wedges on the inner or outer aspect of the heel, can help. These can shift pressure away from the side of the knee most bothered by osteoarthritis.  A brace called an "unloader" brace also may be used to help ease the pressure on the most arthritic side of the knee.  If your caregiver has prescribed crutches, braces, wraps or ice, use as directed. The acronym for this is PRICE. This means protection, rest, ice, compression, and elevation.  Nonsteroidal anti-inflammatory drugs (NSAIDs), can help relieve pain. But if taken immediately after an injury, they may actually increase swelling. Take NSAIDs with food in your stomach. Stop them if you develop  stomach problems. Do not take these if you have a history of ulcers, stomach pain, or bleeding from the bowel. Do not take without your caregiver's approval if you have problems with fluid retention, heart failure, or kidney problems.  For ongoing knee problems, physical therapy may be helpful.  Glucosamine and chondroitin are over-the-counter dietary supplements. Both may help relieve the pain of osteoarthritis in the knee. These medicines are different from the usual anti-inflammatory drugs. Glucosamine may decrease the rate of  cartilage destruction.  Injections of a corticosteroid drug into your knee joint may help reduce the symptoms of an arthritis flare-up. They may provide pain relief that lasts a few months. You may have to wait a few months between injections. The injections do have a small increased risk of infection, water retention, and elevated blood sugar levels.  Hyaluronic acid injected into damaged joints may ease pain and provide lubrication. These injections may work by reducing inflammation. A series of shots may give relief for as long as 6 months.  Topical painkillers. Applying certain ointments to your skin may help relieve the pain and stiffness of osteoarthritis. Ask your pharmacist for suggestions. Many over the-counter products are approved for temporary relief of arthritis pain.  In some countries, doctors often prescribe topical NSAIDs for relief of chronic conditions such as arthritis and tendinitis. A review of treatment with NSAID creams found that they worked as well as oral medications but without the serious side effects. PREVENTION  Maintain a healthy weight. Extra pounds put more strain on your joints.  Get strong, stay limber. Weak muscles are a common cause of knee injuries. Stretching is important. Include flexibility exercises in your workouts.  Be smart about exercise. If you have osteoarthritis, chronic knee pain or recurring injuries, you may need to change the way you exercise. This does not mean you have to stop being active. If your knees ache after jogging or playing basketball, consider switching to swimming, water aerobics, or other low-impact activities, at least for a few days a week. Sometimes limiting high-impact activities will provide relief.  Make sure your shoes fit well. Choose footwear that is right for your sport.  Protect your knees. Use the proper gear for knee-sensitive activities. Use kneepads when playing volleyball or laying carpet. Buckle your seat belt  every time you drive. Most shattered kneecaps occur in car accidents.  Rest when you are tired. SEEK MEDICAL CARE IF:  You have knee pain that is continual and does not seem to be getting better.  SEEK IMMEDIATE MEDICAL CARE IF:  Your knee joint feels hot to the touch and you have a high fever. MAKE SURE YOU:   Understand these instructions.  Will watch your condition.  Will get help right away if you are not doing well or get worse. Document Released: 08/09/2007 Document Revised: 01/04/2012 Document Reviewed: 08/09/2007 [facility] Patient Information 2015 [place], [place]. This information is not intended to replace advice given to you by your health care provider. Make sure you discuss any questions you have with your health care provider.  Emergency Department Resource Guide 1) Find a Doctor and Pay Out of Pocket Although you won't have to find out who is covered by your insurance plan, it is a good idea to ask around and get recommendations. You will then need to call the office and see if the doctor you have chosen will accept you as a new patient and what types of options they offer for patients who are self-pay. Some  doctors offer discounts or will set up payment plans for their patients who do not have insurance, but you will need to ask so you aren't surprised when you get to your appointment.  2) Contact Your Local Health Department Not all health departments have doctors that can see patients for sick visits, but many do, so it is worth a call to see if yours does. If you don't know where your local health department is, you can check in your phone book. The CDC also has a tool to help you locate your state's health department, and many state websites also have listings of all of their local health departments.  3) Find a [facility] If your illness is not likely to be very severe or complicated, you may want to try a walk in clinic. These are popping up all over the country in  pharmacies, drugstores, and shopping centers. They're usually staffed by nurse practitioners or physician assistants that have been trained to treat common illnesses and complaints. They're usually fairly quick and inexpensive. However, if you have serious medical issues or chronic medical problems, these are probably not your best option.  No Primary Care Doctor: - Call Health Connect at  [phone] - they can help you locate a primary care doctor that  accepts your insurance, provides certain services, etc. - Physician Referral Service- [phone]  Chronic Pain Problems: Organization         Address  Phone   Notes  [facility]  [phone] Patients need to be referred by their primary care doctor.   Medication Assistance: Organization         Address  Phone   Notes  [facility] Medication [facility] [address] [place], [place] [address] [phone] --Must be a resident of [facility] -- Must have NO insurance coverage whatsoever (no Medicaid/ Medicare, etc.) -- The pt. MUST have a primary care doctor that directs their care regularly and follows them in the community   MedAssist  [phone]   [employer]  [phone]    Agencies that provide inexpensive medical care: Organization         Address  Phone   Notes  [place]  [phone]   [facility]    [phone]   [facility] [place], [place] [address] [phone]   [place] [address] [place] [phone]   Planned Parenthood    [phone]   [facility]    [phone]   [place] and [place] [address], [place] Phone:  [phone], Fax:  [phone] Hours of Operation:  9 am - 6 pm, M-F.  Also accepts Medicaid/Medicare and self-pay.  [facility] for [address], [address],  [place] Phone: [phone], Fax: [phone]. Hours of Operation:  8:30 am - 5:30 pm, M-F.  Also accepts Medicaid and self-pay.  [facility] [address], [place] Phone: [phone]   [place], [place], [place] [phone], Ext. [phone] Mondays & Thursdays: 7-9 AM.  First 15 patients are seen on a first come, first serve basis.    [place] Providers:  Organization         Address  Phone   Notes  [facility] [address], [place] [phone] Also accepts self-pay patients.  [facility] [address], [place]  [phone]   [place], [address], [place] [phone]   [facility] [address] [place] [phone]   Lucianne Lei [address], [place]   [phone] Only accepts [place] Access [place] patients after they have their name applied to their card.   Self-Pay (no insurance) in [facility]:  Organization         Address  Phone   Notes  Sickle Cell Patients, [facility] [place] [phone]   [facility] [place] [phone]   [facility] [place]  [place], [place], [place] [phone]   [facility]/Dr. Osei-Bonsu  [address], [place] or [address], [place] [phone] Phone number for both [place] and [place] locations is the same.  [facility]gent Medical and [facility] [address], [place] [phone]   [facility] [address], [place] or [address] [phone]   [facility] [address], [place] [phone], phone; [phone], fax Sees patients 1st and 3rd Saturday of every month.  Must not  qualify for public or private insurance (i.e. Medicaid, Medicare, [place] Health Choice, Veterans' Benefits)  Household income should be no more than 200% of the poverty level The clinic cannot treat you if you are pregnant or think you are pregnant  Sexually transmitted diseases are not treated at the clinic.    Dental Care: Organization         Address  Phone  Notes  [facility] Department of [facility] [place] [phone] Accepts children up to age 69 who are enrolled in [place] or [place]; pregnant women with a Medicaid card; and children who have applied for Medicaid or [place] Health Choice, but were declined, whose parents can pay a reduced fee at time of service.  [facility] Department of [facility]  [address], [place] [phone] Accepts children up to age 38 who are enrolled in [place] or [place]; pregnant women with a Medicaid card; and children who have applied for Medicaid or [place] Health Choice, but were declined, whose parents can pay a reduced fee at time of service.  [place] Adult Dental Access PROGRAM  [place] [phone] Patients are seen by appointment only. Walk-ins are not accepted. [place] will see patients 75 years of age and older. Monday - Tuesday (8am-5pm) Most Wednesdays (8:30-5pm) $30 per visit, cash only  [facility] Adult Dental Access PROGRAM  [address], [facility] [phone] Patients are seen by appointment only. Walk-ins are not accepted. [place] will see patients 83 years of age and older. One Wednesday Evening (Monthly: Volunteer Based).  $30 per visit, cash only  [place]  [phone] for adults; Children under age 27, call Graduate Pediatric Dentistry at [phone]. Children aged 9-14, please call [phone] to request a pediatric application.  Dental services are provided  in all areas of dental care including fillings, crowns and bridges, complete and partial dentures, implants, gum treatment, root canals, and extractions. Preventive  care is also provided. Treatment is provided to both adults and children. Patients are selected via a lottery and there is often a waiting list.   [facility] [address] [place]  [phone] [email or web address]   [facility] [address] [place], [place] [phone], Ext. [phone] Second and Fourth Thursday of each month, opens at 6:30 AM; Clinic ends at 9 AM.  Patients are seen on a first-come first-served basis, and a limited number are seen during each clinic.   [facility]  [address] Hillard Danker [place], [place] [phone]   Eligibility Requirements You must have lived in [place], [place], or [place] counties for at least the last three months.   You cannot be eligible for state or federal sponsored [employer], including [employer], [place], or [employer].   You generally cannot be eligible for healthcare insurance through your employer.    How to apply: Eligibility screenings are held every Tuesday and Wednesday afternoon from 1:00 pm until 4:00 pm. You do not need an appointment for the interview!  [facility] [address] [place], [place]   [place]  [place] Department  [place]  [phone]    Behavioral Health Resources in the Community: Intensive Outpatient Programs Organization         Address  Phone  Notes  [place]. [address], [place], [place] [phone]   [facility] [address] [place], [place]   ADS: [facility] [address], [place], [place]   [place] [address],  [place], [place] or [phone]   Substance Abuse Resources Organization         Address  Phone  Notes  Alcohol and Drug Services  [phone]   [place]  [phone]   [facility] [place]   Chinita Pester  [phone]   Residential & Outpatient Substance Abuse Program  [phone]   Psychological Services Organization         Address  Phone  Notes  [facility]  [place]  [phone]   [place] [address], [place] or [phone]    Mobile Crisis Teams Organization         Address  Phone  Notes  [facility], Mobile Crisis Care Unit  [phone]   [facility]  [address] [place], [place]   [place] [address], [place] [phone]    Self-Help/Support Groups Organization         Address  Phone             Notes  [place]. of [place] - variety of support groups  [place] Call for more information  Narcotics Anonymous (NA), [facility] [address], [employer]   2 meetings at this location   Special educational needs teacher         Address  Phone  Notes  [facility] [place],    [place]  [phone]   [facility]  [address], [place] [address]94709, [place], [place]   [place], [place] [phone] Admissions: 8am-3pm M-F  Incentives Substance [place] [address]  [place], [place]   [facility] [address] Jadene Pierini [place], [place]   The [place].,  [place], [place]   [facility] - Intensive Outpatient [address], [place], [place]   [facility] ([place] [address]  [place], [place] [phone] or  [phone]   [facility] (RTS) [address], [place], [place] Accepts Medicaid  [facility] [place] [address]  [place] [phone] Substance Abuse/Addiction Treatment   [facility] Organization         Address  Phone  Notes  [facility]  [phone]   Domenic Schwab, PhD [address] Arlis Porta [place], [place]   [phone] or [phone]   [place], [place] [phone]   [facility] [address], [place], [place] [phone] Insurance/Medicaid/sponsorship through [facility] and Families [address] [place]                                    [place], [place] [phone] [place] [address], [place] [phone]    Dr. Adele Schilder  [phone]   [facility] [place] Dept. 1) [address] [place] 2) [address] [place] 3)  [place] [address], [place] [phone]  [phone]   [place] [phone] or [phone] (After Hours)

## 2014-12-20 NOTE — ED Provider Notes (Signed)
CSN: [id number]     Arrival date & time 12/20/14  1018 History   First MD Initiated Contact with Patient 12/20/14 1035     Chief Complaint  Patient presents with  . Extremity Weakness     (Consider location/radiation/quality/duration/timing/severity/associated sxs/prior Treatment) HPI   34 year old female presents complaining of bilateral lower extremities numbness and weakness. 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  Several months ago she report noticing leg weakness and numbness from prolonged standing which subsequently resolved without any specific treatment.  However for the past 3-4 days she has has progressive worsening achy sensation throughout both legs from feet to knees.  Report tingling sensation throughout, difficulty walking after standing.  She nearly fell today at work and decided to come to ER for further care.  Report recurrent headache affecting her L side of forehead, but admits to having hx of headache which she takes tylenol for.  She denies double vision or vision changes.  No fever, cp, sob, abd pain, back pain, urinary or bowel incontinence or rash.  No recent sickness.  No other specific treatment tried. No hx of PE/DVT, recent surgery, prolonged bed rest, hemoptysis or active cancer.  No hx of Guillain Barre's Syndrome.  Does not have a PCP, not a chronic drinker. No hx of diabetes.   Past Medical History  Diagnosis Date  . Headache(784.0)   . Abnormal Pap smear   . Gonorrhea   . Chlamydia   . Trichomonas   . HPV (human papilloma virus) infection   . Urinary tract infection    Past Surgical History  Procedure Laterality Date  . Colposcopy  2010  . Leep    . Cesarean section  08/31/2011    Procedure: CESAREAN SECTION;  Surgeon: Agnes Lawrence, MD;  Location: [facility];  Service: Gynecology;  Laterality: N/A;  Primary Cesarean section Baby Boy @ 89   No family history on file. History   Substance Use Topics  . Smoking status: Current Every Day Smoker -- 5 years    Types: Cigarettes  . Smokeless tobacco: Never Used     Comment: quit with + preg  . Alcohol Use: No   OB History    Gravida Para Term Preterm AB TAB SAB Ectopic Multiple Living   3 3 2 1      3      Review of Systems  All other systems reviewed and are negative.     Allergies  Review of patient's allergies indicates no known allergies.  Home Medications   Prior to Admission medications   Medication Sig Start Date End Date Taking? Authorizing Provider  ciprofloxacin (CIPRO) 500 MG tablet Take 1 tablet (500 mg total) by mouth 2 (two) times daily. One po bid x 7 days 03/17/14   Maudry Diego, MD  HYDROcodone-acetaminophen (HYCET) 7.5-325 mg/15 ml solution Take 15 mLs by mouth every 6 (six) hours as needed for moderate pain or severe pain. 11/01/14   Antonietta Breach, PA-C  ibuprofen (ADVIL,MOTRIN) 200 MG tablet Take 200 mg by mouth every 6 (six) hours as needed (fever/pain).    Historical Provider, MD  ibuprofen (ADVIL,MOTRIN) 600 MG tablet Take 1 tablet (600 mg total) by mouth every 6 (six) hours as needed. 05/03/14   Julianne Rice, MD  medroxyPROGESTERone (DEPO-PROVERA) 150 MG/ML injection Inject 150 mg into the muscle every 3 (three) months.    Historical Provider, MD  sodium chloride (OCEAN) 0.65 %  SOLN nasal spray Place 1 spray into both nostrils as needed for congestion. 11/01/14   Antonietta Breach, PA-C  traMADol (ULTRAM) 50 MG tablet Take 1 tablet (50 mg total) by mouth every 6 (six) hours as needed. 03/17/14   Maudry Diego, MD   BP 105/59 mmHg  Pulse 83  Temp(Src) 98.2 F (36.8 C) (Oral)  Resp 16  SpO2 100% Physical Exam  Constitutional: She appears well-developed and well-nourished. No distress.  HENT:  Head: Atraumatic.  Eyes: Conjunctivae are normal.  Neck: Neck supple.  Cardiovascular: Intact distal pulses.   Musculoskeletal:  5/5 strength throughout all 4 extremities.  Normal patella DTR  bilat, negative Babinsky, no foot drops.    Neurological: She is alert. She has normal reflexes.  Subjective decreased sensation to both lower extremities with soft/sharp touch extending towards knees.    Skin: No rash noted.  Psychiatric: She has a normal mood and affect.  Nursing note and vitals reviewed.   ED Course  Procedures (including critical care time)  10:54 AM Pt report bilateral ascending leg weakness/tingling sensation.  Does have L knee pain and sxs likely suggestive of prolonged standing causing pain and paresthesia.  Low suspicion for Guillain Barres Syndrome, MS, diabetes or other acute emergent condition. She has normal strength, able to ambulate and no evidence to suggest DVT.  No Bulbar changes.  Care discussed with Dr. Johnney Killian who evaluate pt and agrees.  D/c with NSAIDs, and neurology f/u as needed.  Pt voice understanding and agrees with plan.    Labs Review Labs Reviewed - No data to display  Imaging Review No results found.   EKG Interpretation None      MDM   Final diagnoses:  Pain of left lower extremity  Paresthesias    BP 105/59 mmHg  Pulse 83  Temp(Src) 98.2 F (36.8 C) (Oral)  Resp 16  SpO2 100%     Domenic Moras, PA-C 12/20/14 [place], MD 12/23/14 [phone]

## 2014-12-20 NOTE — ED Notes (Signed)
Pt states that while at working since Monday her legs will get weak and numb from knees down.  Pt states that symptoms occur and get worse the longer she stands on her legs.

## 2015-01-23 ENCOUNTER — Emergency Department (HOSPITAL_COMMUNITY)
Admission: EM | Admit: 2015-01-23 | Discharge: 2015-01-23 | Disposition: A | Discharge status: Home / Self Care | Payer: BLUE CROSS/BLUE SHIELD | Attending: Emergency Medicine | Admitting: Emergency Medicine

## 2015-01-23 ENCOUNTER — Encounter (HOSPITAL_COMMUNITY): Payer: Self-pay | Admitting: *Deleted

## 2015-01-23 DIAGNOSIS — M5441 Lumbago with sciatica, right side: Secondary | ICD-10-CM

## 2015-01-23 DIAGNOSIS — Z79899 Other long term (current) drug therapy: Secondary | ICD-10-CM | POA: Diagnosis not present

## 2015-01-23 DIAGNOSIS — Z87440 Personal history of urinary (tract) infections: Secondary | ICD-10-CM | POA: Insufficient documentation

## 2015-01-23 DIAGNOSIS — R531 Weakness: Secondary | ICD-10-CM | POA: Insufficient documentation

## 2015-01-23 DIAGNOSIS — Z720 Tobacco use: Secondary | ICD-10-CM | POA: Diagnosis not present

## 2015-01-23 DIAGNOSIS — Z8619 Personal history of other infectious and parasitic diseases: Secondary | ICD-10-CM | POA: Insufficient documentation

## 2015-01-23 DIAGNOSIS — Z792 Long term (current) use of antibiotics: Secondary | ICD-10-CM | POA: Insufficient documentation

## 2015-01-23 DIAGNOSIS — M545 Low back pain: Secondary | ICD-10-CM | POA: Insufficient documentation

## 2015-01-23 DIAGNOSIS — M5442 Lumbago with sciatica, left side: Secondary | ICD-10-CM

## 2015-01-23 MED ORDER — METHOCARBAMOL 500 MG PO TABS
500.0000 mg | ORAL_TABLET | Freq: Once | ORAL | Status: AC
  Administered 2015-01-23: 500 mg via ORAL
  Filled 2015-01-23: qty 1

## 2015-01-23 MED ORDER — METHOCARBAMOL 500 MG PO TABS: 500.0000 mg | ORAL_TABLET | Freq: Two times a day (BID) | ORAL | Status: DC

## 2015-01-23 NOTE — ED Provider Notes (Signed)
CSN: [id number]     Arrival date & time 01/23/15  1335 History   First MD Initiated Contact with Patient 01/23/15 1615     Chief Complaint  Patient presents with  . Back Pain     (Consider location/radiation/quality/duration/timing/severity/associated sxs/prior Treatment) HPI Comments: Patient is a 34 year old female past medical history significant for headaches, tobacco abuse presenting to the emergency department for continued low back pain with radiation into bilateral extremities. She states is been present for 3 weeks without precipitating injury or fall. She states she was seen and evaluated for this about 1 week after the onset of her symptoms are only prescribed ibuprofen which is not providing relief. States her pain is worsened with positional changes, palpation, lifting, prolonged standing. No modifying factors identified. Patient states she walks and stands for most of her work day with lifting. She states sometimes the pain gets so bad her legs give out. Denies any fevers, chills, bladder or bowel incontinence, history of IV drug abuse, night sweats, history of cancer.  Patient is a 34 y.o. female presenting with back pain.  Back Pain Associated symptoms: weakness (bilateral LE)   Associated symptoms: no fever     Past Medical History  Diagnosis Date  . Headache(784.0)   . Abnormal Pap smear   . Gonorrhea   . Chlamydia   . Trichomonas   . HPV (human papilloma virus) infection   . Urinary tract infection    Past Surgical History  Procedure Laterality Date  . Colposcopy  2010  . Leep    . Cesarean section  08/31/2011    Procedure: CESAREAN SECTION;  Surgeon: Agnes Lawrence, MD;  Location: [facility];  Service: Gynecology;  Laterality: N/A;  Primary Cesarean section Baby Boy @ 63   No family history on file. History  Substance Use Topics  . Smoking status: Current Every Day Smoker -- 5 years    Types: Cigarettes  . Smokeless tobacco: Never Used     Comment: quit  with + preg  . Alcohol Use: No   OB History    Gravida Para Term Preterm AB TAB SAB Ectopic Multiple Living   3 3 2 1      3      Review of Systems  Constitutional: Negative for fever.  Musculoskeletal: Positive for back pain.  Neurological: Positive for weakness (bilateral LE).  All other systems reviewed and are negative.     Allergies  Review of patient's allergies indicates no known allergies.  Home Medications   Prior to Admission medications   Medication Sig Start Date End Date Taking? Authorizing Provider  ciprofloxacin (CIPRO) 500 MG tablet Take 1 tablet (500 mg total) by mouth 2 (two) times daily. One po bid x 7 days 03/17/14   Milton Ferguson, MD  HYDROcodone-acetaminophen (HYCET) 7.5-325 mg/15 ml solution Take 15 mLs by mouth every 6 (six) hours as needed for moderate pain or severe pain. 11/01/14   Antonietta Breach, PA-C  ibuprofen (ADVIL,MOTRIN) 200 MG tablet Take 200 mg by mouth every 6 (six) hours as needed (fever/pain).    Historical Provider, MD  ibuprofen (ADVIL,MOTRIN) 600 MG tablet Take 1 tablet (600 mg total) by mouth every 6 (six) hours as needed. 05/03/14   Julianne Rice, MD  ibuprofen (ADVIL,MOTRIN) 600 MG tablet Take 1 tablet (600 mg total) by mouth every 6 (six) hours as needed. 12/20/14   Charlesetta Shanks, MD  medroxyPROGESTERone (DEPO-PROVERA) 150 MG/ML injection Inject 150 mg into the muscle every 3 (three) months.  Historical Provider, MD  methocarbamol (ROBAXIN) 500 MG tablet Take 1 tablet (500 mg total) by mouth 2 (two) times daily. 01/23/15   Orean Giarratano, PA-C  sodium chloride (OCEAN) 0.65 % SOLN nasal spray Place 1 spray into both nostrils as needed for congestion. 11/01/14   Antonietta Breach, PA-C  traMADol (ULTRAM) 50 MG tablet Take 1 tablet (50 mg total) by mouth every 6 (six) hours as needed. 03/17/14   Milton Ferguson, MD   BP 106/63 mmHg  Pulse 89  Temp(Src) 98.5 F (36.9 C) (Oral)  Resp 16  Ht 5\' 8"  (1.727 m)  Wt 145 lb (65.772 kg)  BMI 22.05 kg/m2   SpO2 100% Physical Exam  Constitutional: She is oriented to person, place, and time. She appears well-developed and well-nourished. No distress.  HENT:  Head: Normocephalic and atraumatic.  Right Ear: External ear normal.  Left Ear: External ear normal.  Nose: Nose normal.  Mouth/Throat: Oropharynx is clear and moist. No oropharyngeal exudate.  Eyes: Conjunctivae and EOM are normal. Pupils are equal, round, and reactive to light.  Neck: Normal range of motion. Neck supple.  Cardiovascular: Normal rate, regular rhythm, normal heart sounds and intact distal pulses.   Pulmonary/Chest: Effort normal and breath sounds normal. No respiratory distress.  Abdominal: Soft. There is no tenderness.  Musculoskeletal: Normal range of motion.       Cervical back: Normal.       Thoracic back: Normal.       Lumbar back: She exhibits tenderness, pain and spasm. She exhibits normal range of motion, no bony tenderness, no swelling, no edema, no deformity, no laceration and normal pulse.  Neurological: She is alert and oriented to person, place, and time. She has normal strength. No cranial nerve deficit. Gait normal. GCS eye subscore is 4. GCS verbal subscore is 5. GCS motor subscore is 6.  Sensation grossly intact.  No pronator drift.  Bilateral heel-knee-shin intact.  Skin: Skin is warm and dry. She is not diaphoretic.  Nursing note and vitals reviewed.   ED Course  Procedures (including critical care time) Medications  methocarbamol (ROBAXIN) tablet 500 mg (500 mg Oral Given 01/23/15 1714)   Labs Review Labs Reviewed - No data to display  Imaging Review No results found.   EKG Interpretation None      MDM   Final diagnoses:  Bilateral low back pain with sciatica, sciatica laterality unspecified    Filed Vitals:   01/23/15 1642  BP: 106/63  Pulse: 89  Temp: 98.5 F (36.9 C)  Resp: 16   Afebrile, NAD, non-toxic appearing, AAOx4.  Patient with back pain.  No neurological  deficits and normal neuro exam.  Patient can walk but states is painful.  No loss of bowel or bladder control.  No concern for cauda equina.  No fever, night sweats, weight loss, h/o cancer, IVDU.  RICE protocol and pain medicine indicated and discussed with patient. Patient is stable at time of discharge.     Baron Sane, PA-C 01/23/15 2056  Orlie Dakin, MD 01/24/15 [phone]

## 2015-01-23 NOTE — Discharge Instructions (Signed)
Please follow up with your primary care physician in 1-2 days. If you do not have one please call the [facility] number listed above. Please take pain medication and/or muscle relaxants as prescribed and as needed for pain. Please do not drive on narcotic pain medication or on muscle relaxants. Please read all discharge instructions and return precautions.  ° °Back Pain, Adult °Low back pain is very common. About 1 in 5 people have back pain. The cause of low back pain is rarely dangerous. The pain often gets better over time. About half of people with a sudden onset of back pain feel better in just 2 weeks. About 8 in 10 people feel better by 6 weeks.  °CAUSES °Some common causes of back pain include: °· Strain of the muscles or ligaments supporting the spine. °· Wear and tear (degeneration) of the spinal discs. °· Arthritis. °· Direct injury to the back. °DIAGNOSIS °Most of the time, the direct cause of low back pain is not known. However, back pain can be treated effectively even when the exact cause of the pain is unknown. Answering your caregiver's questions about your overall health and symptoms is one of the most accurate ways to make sure the cause of your pain is not dangerous. If your caregiver needs more information, he or she may order lab work or imaging tests (X-rays or MRIs). However, even if imaging tests show changes in your back, this usually does not require surgery. °HOME CARE INSTRUCTIONS °For many people, back pain returns. Since low back pain is rarely dangerous, it is often a condition that people can learn to manage on their own.  °· Remain active. It is stressful on the back to sit or stand in one place. Do not sit, drive, or stand in one place for more than 30 minutes at a time. Take short walks on level surfaces as soon as pain allows. Try to increase the length of time you walk each day. °· Do not stay in bed. Resting more than 1 or 2 days can delay your  recovery. °· Do not avoid exercise or work. Your body is made to move. It is not dangerous to be active, even though your back may hurt. Your back will likely heal faster if you return to being active before your pain is gone. °· Pay attention to your body when you  bend and lift. Many people have less discomfort when lifting if they bend their knees, keep the load close to their bodies, and avoid twisting. Often, the most comfortable positions are those that put less stress on your recovering back. °· Find a comfortable position to sleep. Use a firm mattress and lie on your side with your knees slightly bent. If you lie on your back, put a pillow under your knees. °· Only take over-the-counter or prescription medicines as directed by your caregiver. Over-the-counter medicines to reduce pain and inflammation are often the most helpful. Your caregiver may prescribe muscle relaxant drugs. These medicines help dull your pain so you can more quickly return to your normal activities and healthy exercise. °· Put ice on the injured area. °¨ Put ice in a plastic bag. °¨ Place a towel between your skin and the bag. °¨ Leave the ice on for 15-20 minutes, 03-04 times a day for the first 2 to 3 days. After that, ice and heat may be alternated to reduce pain and spasms. °· Ask your caregiver about trying back exercises and gentle massage. This may be of some benefit. °· Avoid feeling anxious or stressed. Stress increases muscle tension and   can worsen back pain. It is important to recognize when you are anxious or stressed and learn ways to manage it. Exercise is a great option. °SEEK MEDICAL CARE IF: °· You have pain that is not relieved with rest or medicine. °· You have pain that does not improve in 1 week. °· You have new symptoms. °· You are generally not feeling well. °SEEK IMMEDIATE MEDICAL CARE IF:  °· You have pain that radiates from your back into your legs. °· You develop new bowel or bladder control problems. °· You  have unusual weakness or numbness in your arms or legs. °· You develop nausea or vomiting. °· You develop abdominal pain. °· You feel faint. °Document Released: 10/12/2005 Document Revised: 04/12/2012 Document Reviewed: 02/13/2014 °ExitCare® Patient Information ©2015 [employer]. This information is not intended to replace advice given to you by your health care provider. Make sure you discuss any questions you have with your health care provider. ° °

## 2015-01-23 NOTE — ED Notes (Addendum)
Pt states that she has had lower back pain that has been ongoing for several weeks. Pt states that she was seen at [facility] long for the same thing 2 weeks ago and was given ibuprophen. Pt states that she has not been able to find a PCP to follow up with. Pt reports walking and standing a lot a work. Denies urinary symptoms. Pt states that she has had episodes of pain that extends to her legs and bilateral leg weakness. No neuro deficits at triage.

## 2015-01-23 NOTE — ED Notes (Signed)
Patient is able to ambulate on her own. She said her back hurts when she sits up or lays down.

## 2015-04-13 ENCOUNTER — Emergency Department (HOSPITAL_COMMUNITY)
Admission: EM | Admit: 2015-04-13 | Discharge: 2015-04-14 | Disposition: A | Discharge status: Home / Self Care | Payer: BLUE CROSS/BLUE SHIELD | Attending: Emergency Medicine | Admitting: Emergency Medicine

## 2015-04-13 ENCOUNTER — Encounter (HOSPITAL_COMMUNITY): Payer: Self-pay | Admitting: *Deleted

## 2015-04-13 DIAGNOSIS — Z8619 Personal history of other infectious and parasitic diseases: Secondary | ICD-10-CM | POA: Insufficient documentation

## 2015-04-13 DIAGNOSIS — S0591XA Unspecified injury of right eye and orbit, initial encounter: Secondary | ICD-10-CM | POA: Diagnosis not present

## 2015-04-13 DIAGNOSIS — T148XXA Other injury of unspecified body region, initial encounter: Secondary | ICD-10-CM

## 2015-04-13 DIAGNOSIS — Z8679 Personal history of other diseases of the circulatory system: Secondary | ICD-10-CM | POA: Insufficient documentation

## 2015-04-13 DIAGNOSIS — T148 Other injury of unspecified body region: Secondary | ICD-10-CM | POA: Diagnosis not present

## 2015-04-13 DIAGNOSIS — Y939 Activity, unspecified: Secondary | ICD-10-CM | POA: Diagnosis not present

## 2015-04-13 DIAGNOSIS — Z79891 Long term (current) use of opiate analgesic: Secondary | ICD-10-CM | POA: Diagnosis not present

## 2015-04-13 DIAGNOSIS — W228XXA Striking against or struck by other objects, initial encounter: Secondary | ICD-10-CM | POA: Insufficient documentation

## 2015-04-13 DIAGNOSIS — S0993XA Unspecified injury of face, initial encounter: Secondary | ICD-10-CM | POA: Diagnosis present

## 2015-04-13 DIAGNOSIS — Y999 Unspecified external cause status: Secondary | ICD-10-CM | POA: Diagnosis not present

## 2015-04-13 DIAGNOSIS — Z87440 Personal history of urinary (tract) infections: Secondary | ICD-10-CM | POA: Insufficient documentation

## 2015-04-13 DIAGNOSIS — Y92834 Zoological garden (Zoo) as the place of occurrence of the external cause: Secondary | ICD-10-CM | POA: Insufficient documentation

## 2015-04-13 DIAGNOSIS — H5710 Ocular pain, unspecified eye: Secondary | ICD-10-CM

## 2015-04-13 NOTE — ED Notes (Signed)
Pt reports getting [place] in R side of her [place].  Denies LOC.  Pt reports R facial pain and R eye blurred vision.  Pt reports dizziness as soon as it happened but is now resolved.

## 2015-04-14 ENCOUNTER — Emergency Department (HOSPITAL_COMMUNITY): Payer: BLUE CROSS/BLUE SHIELD

## 2015-04-14 NOTE — Discharge Instructions (Signed)
Contusion °A contusion is a deep bruise. Contusions are the result of an injury that caused bleeding under the skin. The contusion may turn blue, purple, or yellow. Minor injuries will give you a painless contusion, but more severe contusions may stay painful and swollen for a few weeks.  °CAUSES  °A contusion is usually caused by a blow, trauma, or direct force to an area of the body. °SYMPTOMS  °· Swelling and redness of the injured area. °· Bruising of the injured area. °· Tenderness and soreness of the injured area. °· Pain. °DIAGNOSIS  °The diagnosis can be made by taking a history and physical exam. An X-ray, CT scan, or MRI may be needed to determine if there were any associated injuries, such as fractures. °TREATMENT  °Specific treatment will depend on what area of the body was injured. In general, the best treatment for a contusion is resting, icing, elevating, and applying cold compresses to the injured area. Over-the-counter medicines may also be recommended for pain control. Ask your caregiver what the best treatment is for your contusion. °HOME CARE INSTRUCTIONS  °· Put ice on the injured area. °· Put ice in a plastic bag. °· Place a towel between your skin and the bag. °· Leave the ice on for 15-20 minutes, 3-4 times a day, or as directed by your health care provider. °· Only take over-the-counter or prescription medicines for pain, discomfort, or fever as directed by your caregiver. Your caregiver may recommend avoiding anti-inflammatory medicines (aspirin, ibuprofen, and naproxen) for 48 hours because these medicines may increase bruising. °· Rest the injured area. °· If possible, elevate the injured area to reduce swelling. °SEEK IMMEDIATE MEDICAL CARE IF:  °· You have increased bruising or swelling. °· You have pain that is getting worse. °· Your swelling or pain is not relieved with medicines. °MAKE SURE YOU:  °· Understand these instructions. °· Will watch your condition. °· Will get help right  away if you are not doing well or get worse. °Document Released: 07/22/2005 Document Revised: 10/17/2013 Document Reviewed: 08/17/2011 °ExitCare® Patient Information ©2015 [employer]. This information is not intended to replace advice given to you by your health care provider. Make sure you discuss any questions you have with your health care provider. ° ° °Head Injury °You have received a head injury. It does not appear serious at this time. Headaches and vomiting are common following head injury. It should be easy to awaken from sleeping. Sometimes it is necessary for you to stay in the emergency department for a while for observation. Sometimes admission to the hospital may be needed. After injuries such as yours, most problems occur within the first 24 hours, but side effects may occur up to 7-10 days after the injury. It is important for you to carefully monitor your condition and contact your health care provider or seek immediate medical care if there is a change in your condition. °WHAT ARE THE TYPES OF HEAD INJURIES? °Head injuries can be as minor as a bump. Some head injuries can be more severe. More severe head injuries include: °· A jarring injury to the brain (concussion). °· A bruise of the brain (contusion). This mean there is bleeding in the brain that can cause swelling. °· A cracked skull (skull fracture). °· Bleeding in the brain that collects, clots, and forms a bump (hematoma). °WHAT CAUSES A HEAD INJURY? °A serious head injury is most likely to happen to someone who is in a car wreck and is not   wearing a seat belt. Other causes of major head injuries include bicycle or motorcycle accidents, sports injuries, and falls. °HOW ARE HEAD INJURIES DIAGNOSED? °A complete history of the event leading to the injury and your current symptoms will be helpful in diagnosing head injuries. Many times, pictures of the brain, such as CT or MRI are needed to see the extent of the injury. Often, an overnight  hospital stay is necessary for observation.  °WHEN SHOULD I SEEK IMMEDIATE MEDICAL CARE?  °You should get help right away if: °· You have confusion or drowsiness. °· You feel sick to your stomach (nauseous) or have continued, forceful vomiting. °· You have dizziness or unsteadiness that is getting worse. °· You have severe, continued headaches not relieved by medicine. Only take over-the-counter or prescription medicines for pain, fever, or discomfort as directed by your health care provider. °· You do not have normal function of the arms or legs or are unable to walk. °· You notice changes in the black spots in the center of the colored part of your eye (pupil). °· You have a clear or bloody fluid coming from your nose or ears. °· You have a loss of vision. °During the next 24 hours after the injury, you must stay with someone who can watch you for the warning signs. This person should contact local emergency services (911 in the [place] if you have seizures, you become unconscious, or you are unable to wake up. °HOW CAN I PREVENT A HEAD INJURY IN THE FUTURE? °The most important factor for preventing major head injuries is avoiding motor vehicle accidents.  To minimize the potential for damage to your head, it is crucial to wear seat belts while riding in motor vehicles. Wearing helmets while bike riding and playing collision sports (like football) is also helpful. Also, avoiding dangerous activities around the house will further help reduce your risk of head injury.  °WHEN CAN I RETURN TO NORMAL ACTIVITIES AND ATHLETICS? °You should be reevaluated by your health care provider before returning to these activities. If you have any of the following symptoms, you should not return to activities or contact sports until 1 week after the symptoms have stopped: °· Persistent headache. °· Dizziness or vertigo. °· Poor attention and concentration. °· Confusion. °· Memory problems. °· Nausea or vomiting. °· Fatigue or tire  easily. °· Irritability. °· Intolerant of bright lights or loud noises. °· Anxiety or depression. °· Disturbed sleep. °MAKE SURE YOU:  °· Understand these instructions. °· Will watch your condition. °· Will get help right away if you are not doing well or get worse. °Document Released: 10/12/2005 Document Revised: 10/17/2013 Document Reviewed: 06/19/2013 °ExitCare® Patient Information ©2015 [employer]. This information is not intended to replace advice given to you by your health care provider. Make sure you discuss any questions you have with your health care provider. ° °
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
## 2016-03-26 ENCOUNTER — Emergency Department (HOSPITAL_COMMUNITY): Payer: BLUE CROSS/BLUE SHIELD

## 2016-03-26 ENCOUNTER — Emergency Department (HOSPITAL_COMMUNITY)
Admission: EM | Admit: 2016-03-26 | Discharge: 2016-03-26 | Disposition: A | Discharge status: Home / Self Care | Payer: BLUE CROSS/BLUE SHIELD | Attending: Emergency Medicine | Admitting: Emergency Medicine

## 2016-03-26 ENCOUNTER — Encounter (HOSPITAL_COMMUNITY): Payer: Self-pay | Admitting: Emergency Medicine

## 2016-03-26 DIAGNOSIS — R079 Chest pain, unspecified: Secondary | ICD-10-CM | POA: Insufficient documentation

## 2016-03-26 DIAGNOSIS — F17210 Nicotine dependence, cigarettes, uncomplicated: Secondary | ICD-10-CM | POA: Diagnosis not present

## 2016-03-26 DIAGNOSIS — Z791 Long term (current) use of non-steroidal anti-inflammatories (NSAID): Secondary | ICD-10-CM | POA: Insufficient documentation

## 2016-03-26 MED ORDER — GI COCKTAIL ~~LOC~~
30.0000 mL | Freq: Once | ORAL | Status: AC
  Administered 2016-03-26: 30 mL via ORAL
  Filled 2016-03-26: qty 30

## 2016-03-26 NOTE — ED Provider Notes (Signed)
CSN: [id number]     Arrival date & time 03/26/16  [id number] History   First MD Initiated Contact with Patient 03/26/16 1011     Chief Complaint  Patient presents with  . Chest Pain     (Consider location/radiation/quality/duration/timing/severity/associated sxs/prior Treatment) Patient is a 35 y.o. female presenting with chest pain. The history is provided by the patient.  Chest Pain Pain location:  Substernal area and epigastric Pain quality: sharp and shooting   Pain radiates to:  Does not radiate Pain radiates to the back: no   Pain severity:  Moderate Onset quality:  Sudden Duration:  2 days Timing:  Constant Progression:  Worsening Chronicity:  New Relieved by:  Nothing Worsened by:  Coughing, exertion, movement and certain positions Associated symptoms: no dizziness, no fever, no headache, no nausea, no palpitations, no shortness of breath and not vomiting   Risk factors: smoking   Risk factors: no coronary artery disease    35 yo F With a chief complaint of chest pain. This is all about the upper chest wall worse with movement palpation eating. Nonexertional. 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 Denies cough congestion fevers or chills. Is a smoker. Denies family history of early cardiac disease. Denies history of PE. Is on Depo-Provera for birth control. Denies recent trips recent surgery lower extremity edema.  Past Medical History  Diagnosis Date  . Headache(784.0)   . Abnormal Pap smear   . Gonorrhea   . Chlamydia   . Trichomonas   . HPV (human papilloma virus) infection   . Urinary tract infection    Past Surgical History  Procedure Laterality Date  . Colposcopy  2010  . Leep    . Cesarean section  08/31/2011    Procedure: CESAREAN SECTION;  Surgeon: Agnes Lawrence, MD;  Location: [facility];  Service: Gynecology;  Laterality: N/A;  Primary Cesarean section Baby Boy @ 1418   History reviewed. No pertinent family  history. Social History  Substance Use Topics  . Smoking status: Current Every Day Smoker -- 5 years    Types: Cigarettes  . Smokeless tobacco: Never Used     Comment: quit with + preg  . Alcohol Use: No   OB History    Gravida Para Term Preterm AB TAB SAB Ectopic Multiple Living   3 3 2 1      3      Review of Systems  Constitutional: Negative for fever and chills.  HENT: Negative for congestion and rhinorrhea.   Eyes: Negative for redness and visual disturbance.  Respiratory: Negative for shortness of breath and wheezing.   Cardiovascular: Negative for chest pain and palpitations.  Gastrointestinal: Negative for nausea and vomiting.  Genitourinary: Negative for dysuria and urgency.  Musculoskeletal: Negative for myalgias and arthralgias.  Skin: Negative for pallor and wound.  Neurological: Negative for dizziness and headaches.      Allergies  Review of patient's allergies indicates no known allergies.  Home Medications   Prior to Admission medications   Medication Sig Start Date End Date Taking? Authorizing Provider  ibuprofen (ADVIL,MOTRIN) 200 MG tablet Take 800 mg by mouth every 6 (six) hours as needed for moderate pain (fever/pain).    Yes Historical Provider, MD  medroxyPROGESTERone (DEPO-PROVERA) 150 MG/ML injection Inject 150 mg into the muscle every 3 (three) months.   Yes Historical Provider, MD  naproxen sodium (ANAPROX) 220 MG tablet Take 220 mg by mouth daily as needed (pain).  Yes Historical Provider, MD   BP 96/65 mmHg  Pulse 79  Temp(Src) 98.6 F (37 C) (Oral)  Resp 18  SpO2 99% Physical Exam  Constitutional: She is oriented to person, place, and time. She appears well-developed and well-nourished. No distress.  HENT:  Head: Normocephalic and atraumatic.  Eyes: EOM are normal. Pupils are equal, round, and reactive to light.  Neck: Normal range of motion. Neck supple.  Cardiovascular: Normal rate and regular rhythm.  Exam reveals no gallop and no  friction rub.   No murmur heard. Pulmonary/Chest: Effort normal. She has no wheezes. She has no rales.  Abdominal: Soft. She exhibits no distension. There is no tenderness.  Musculoskeletal: She exhibits no edema or tenderness.  Neurological: She is alert and oriented to person, place, and time.  Skin: Skin is warm and dry. She is not diaphoretic.  Psychiatric: She has a normal mood and affect. Her behavior is normal.  Nursing note and vitals reviewed.   ED Course  Procedures (including critical care time) Labs Review Labs Reviewed - No data to display  Imaging Review Dg Chest 2 View  03/26/2016  CLINICAL DATA:  Patient with centralized chest pain for 3 days. Lightheadedness. EXAM: CHEST  2 VIEW COMPARISON:  Chest radiograph 09/07/2013. FINDINGS: The heart size and mediastinal contours are within normal limits. Both lungs are clear. The visualized skeletal structures are unremarkable. IMPRESSION: No active cardiopulmonary disease. Electronically Signed   By: Lovey Newcomer M.D.   On: 03/26/2016 10:17   I have personally reviewed and evaluated these images and lab results as part of my medical decision-making.   EKG Interpretation   Date/Time:  Thursday March 26 2016 10:00:12 EDT Ventricular Rate:  80 PR Interval:  141 QRS Duration: 101 QT Interval:  395 QTC Calculation: 456 R Axis:   86 Text Interpretation:  Sinus arrythmia No significant change since last  tracing Confirmed by Angeni Chaudhuri MD, Quillian Quince [phone]) on 03/26/2016 10:28:10 AM Also  confirmed by Tyrone Nine MD, DANIEL [phone]), editor [place] CT, Leda Gauze [phone])   on 03/26/2016 10:34:04 AM      MDM   Final diagnoses:  Chest pain, unspecified chest pain type   Discussed smoking cessation with patient and was they were offerred resources to help stop.  Total time was 5 min CPT code [id number].   35 yo F with chest pain.  Atypical. PERC negative. EKG and chest x-ray are unremarkable. Patient has some symptoms when eating as well as some with  movement and palpation unsure whether this is muscular skeletal or GI in nature. Given a GI cocktail. Trial of Zantac. PCP follow-up.  10:49 AM:  I have discussed the diagnosis/risks/treatment options with the patient and believe the pt to be eligible for discharge home to follow-up with PCP. We also discussed returning to the ED immediately if new or worsening sx occur. We discussed the sx which are most concerning (e.g., sudden worsening pain, fever, inability to tolerate by mouth) that necessitate immediate return. Medications administered to the patient during their visit and any new prescriptions provided to the patient are listed below.  Medications given during this visit Medications  gi cocktail (Maalox,Lidocaine,Donnatal) (30 mLs Oral Given 03/26/16 1035)    New Prescriptions   No medications on file    The patient appears reasonably screen and/or stabilized for discharge and I doubt any other medical condition or other [facility] requiring further screening, evaluation, or treatment in the ED at this time prior to discharge.     Linna Hoff  Tyrone Nine, [place] 03/26/16 1049

## 2016-03-26 NOTE — ED Notes (Signed)
Central chest pain, aching, over last three days with lightheadedness, dizziness, and back pain. Vitals stable in triage, denies SOB, nausea and vomiting. Hx of smoking, denies new cough

## 2016-03-26 NOTE — Discharge Instructions (Signed)
Try zantac 150mg  twice a day for your pain.   Nonspecific Chest Pain  Chest pain can be caused by many different conditions. There is always a chance that your pain could be related to something serious, such as a heart attack or a blood clot in your lungs. Chest pain can also be caused by conditions that are not life-threatening. If you have chest pain, it is very important to follow up with your health care provider. CAUSES  Chest pain can be caused by:  Heartburn.  Pneumonia or bronchitis.  Anxiety or stress.  Inflammation around your heart (pericarditis) or lung (pleuritis or pleurisy).  A blood clot in your lung.  A collapsed lung (pneumothorax). It can develop suddenly on its own (spontaneous pneumothorax) or from trauma to the chest.  Shingles infection (varicella-zoster virus).  Heart attack.  Damage to the bones, muscles, and cartilage that make up your chest wall. This can include:  Bruised bones due to injury.  Strained muscles or cartilage due to frequent or repeated coughing or overwork.  Fracture to one or more ribs.  Sore cartilage due to inflammation (costochondritis). RISK FACTORS  Risk factors for chest pain may include:  Activities that increase your risk for trauma or injury to your chest.  Respiratory infections or conditions that cause frequent coughing.  Medical conditions or overeating that can cause heartburn.  Heart disease or family history of heart disease.  Conditions or health behaviors that increase your risk of developing a blood clot.  Having had chicken pox (varicella zoster). SIGNS AND SYMPTOMS Chest pain can feel like:  Burning or tingling on the surface of your chest or deep in your chest.  Crushing, pressure, aching, or squeezing pain.  Dull or sharp pain that is worse when you move, cough, or take a deep breath.  Pain that is also felt in your back, neck, shoulder, or arm, or pain that spreads to any of these areas. Your  chest pain may come and go, or it may stay constant. DIAGNOSIS Lab tests or other studies may be needed to find the cause of your pain. Your health care provider may have you take a test called an ambulatory ECG (electrocardiogram). An ECG records your heartbeat patterns at the time the test is performed. You may also have other tests, such as:  Transthoracic echocardiogram (TTE). During echocardiography, sound waves are used to create a picture of all of the heart structures and to look at how blood flows through your heart.  Transesophageal echocardiogram (TEE).This is a more advanced imaging test that obtains images from inside your body. It allows your health care provider to see your heart in finer detail.  Cardiac monitoring. This allows your health care provider to monitor your heart rate and rhythm in real time.  Holter monitor. This is a portable device that records your heartbeat and can help to diagnose abnormal heartbeats. It allows your health care provider to track your heart activity for several days, if needed.  Stress tests. These can be done through exercise or by taking medicine that makes your heart beat more quickly.  Blood tests.  Imaging tests. TREATMENT  Your treatment depends on what is causing your chest pain. Treatment may include:  Medicines. These may include:  Acid blockers for heartburn.  Anti-inflammatory medicine.  Pain medicine for inflammatory conditions.  Antibiotic medicine, if an infection is present.  Medicines to dissolve blood clots.  Medicines to treat coronary artery disease.  Supportive care for conditions that do  not require medicines. This may include:  Resting.  Applying heat or cold packs to injured areas.  Limiting activities until pain decreases. HOME CARE INSTRUCTIONS  If you were prescribed an antibiotic medicine, finish it all even if you start to feel better.  Avoid any activities that bring on chest pain.  Do not  use any tobacco products, including cigarettes, chewing tobacco, or electronic cigarettes. If you need help quitting, ask your health care provider.  Do not drink alcohol.  Take medicines only as directed by your health care provider.  Keep all follow-up visits as directed by your health care provider. This is important. This includes any further testing if your chest pain does not go away.  If heartburn is the cause for your chest pain, you may be told to keep your head raised (elevated) while sleeping. This reduces the chance that acid will go from your stomach into your esophagus.  Make lifestyle changes as directed by your health care provider. These may include:  Getting regular exercise. Ask your health care provider to suggest some activities that are safe for you.  Eating a heart-healthy diet. A registered dietitian can help you to learn healthy eating options.  Maintaining a healthy weight.  Managing diabetes, if necessary.  Reducing stress. SEEK MEDICAL CARE IF:  Your chest pain does not go away after treatment.  You have a rash with blisters on your chest.  You have a fever. SEEK IMMEDIATE MEDICAL CARE IF:   Your chest pain is worse.  You have an increasing cough, or you cough up blood.  You have severe abdominal pain.  You have severe weakness.  You faint.  You have chills.  You have sudden, unexplained chest discomfort.  You have sudden, unexplained discomfort in your arms, back, neck, or jaw.  You have shortness of breath at any time.  You suddenly start to sweat, or your skin gets clammy.  You feel nauseous or you vomit.  You suddenly feel light-headed or dizzy.  Your heart begins to beat quickly, or it feels like it is skipping beats. These symptoms may represent a serious problem that is an emergency. Do not wait to see if the symptoms will go away. Get medical help right away. Call your local emergency services (911 in the [place] Do not drive  yourself to the hospital.   This information is not intended to replace advice given to you by your health care provider. Make sure you discuss any questions you have with your health care provider.   Document Released: 07/22/2005 Document Revised: 11/02/2014 Document Reviewed: 05/18/2014 Elsevier Interactive Patient Education [employer].

## 2016-05-20 ENCOUNTER — Encounter (HOSPITAL_COMMUNITY): Payer: Self-pay | Admitting: Emergency Medicine

## 2016-05-20 ENCOUNTER — Emergency Department (HOSPITAL_COMMUNITY)
Admission: EM | Admit: 2016-05-20 | Discharge: 2016-05-20 | Disposition: A | Discharge status: Home / Self Care | Payer: BLUE CROSS/BLUE SHIELD | Attending: Physician Assistant | Admitting: Physician Assistant

## 2016-05-20 DIAGNOSIS — M79604 Pain in right leg: Secondary | ICD-10-CM | POA: Diagnosis not present

## 2016-05-20 DIAGNOSIS — F17210 Nicotine dependence, cigarettes, uncomplicated: Secondary | ICD-10-CM | POA: Insufficient documentation

## 2016-05-20 DIAGNOSIS — M79605 Pain in left leg: Secondary | ICD-10-CM

## 2016-05-20 MED ORDER — NAPROXEN 250 MG PO TABS: 250.0000 mg | ORAL_TABLET | Freq: Two times a day (BID) | ORAL | 0 refills | Status: DC

## 2016-05-20 NOTE — ED Notes (Signed)
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

## 2016-05-20 NOTE — ED Triage Notes (Signed)
Pt c/o bilateral calf pain x3 days. Pt reports standing "12 hours a day, 6 days a week" at work. Ambulatory to triage.

## 2016-05-20 NOTE — ED Provider Notes (Signed)
[facility] DEPT Provider Note   CSN: [id number] Arrival date & time: 05/20/16  1636  First Provider Contact:  6:07 PM    By signing my name below, I, Rayna Sexton, attest that this documentation has been prepared under the direction and in the presence of Waynetta Pean, PA-C. Electronically Signed: Rayna Sexton, ED Scribe. 05/20/16. 6:10 PM.  History   Chief Complaint Chief Complaint  Patient presents with  . Leg Pain    HPI HPI Comments: [name] is a 35 y.o. female who presents to the Emergency Department complaining of worsening, mild, bilateral, knee and leg pain x 3 days. Pt states that she stands and ambulates >12 hours per day for work which she believes is the cause of her symptoms. She reports associated, mild, bilateral knee stiffness that is worse after sitting. She reports sometimes her legs swell, but not currently. No unilateral leg swelling. . She has taken ibuprofen which provides mild relief of her symptoms. She denies fevers, muscle cramping, chills, CP, SOB, numbness, tingling and weakness.   The history is provided by the patient. No language interpreter was used.    Past Medical History:  Diagnosis Date  . Abnormal Pap smear   . Chlamydia   . Gonorrhea   . Headache(784.0)   . HPV (human papilloma virus) infection   . Trichomonas   . Urinary tract infection     Patient Active Problem List   Diagnosis Date Noted  . Maternal anemia complicating pregnancy, childbirth, or the puerperium 09/02/2011  . Prolonged latent phase of labor 08/31/2011  . Cesarean delivery, without mention of indication, delivered, with or without mention of antepartum condition 08/31/2011  . Preterm premature rupture of membranes 08/27/2011  . Gonorrhea   . Chlamydia     Past Surgical History:  Procedure Laterality Date  . CESAREAN SECTION  08/31/2011   Procedure: CESAREAN SECTION;  Surgeon: Agnes Lawrence, MD;  Location: [facility];  Service: Gynecology;   Laterality: N/A;  Primary Cesarean section Baby Boy @ 1418  . COLPOSCOPY  2010  . LEEP      OB History    Gravida Para Term Preterm AB Living   3 3 2 1   3    SAB TAB Ectopic Multiple Live Births                   Home Medications    Prior to Admission medications   Medication Sig Start Date End Date Taking? Authorizing Provider  medroxyPROGESTERone (DEPO-PROVERA) 150 MG/ML injection Inject 150 mg into the muscle every 3 (three) months.    Historical Provider, MD  naproxen (NAPROSYN) 250 MG tablet Take 1 tablet (250 mg total) by mouth 2 (two) times daily with a meal. 05/20/16   Waynetta Pean, PA-C    Family History History reviewed. No pertinent family history.  Social History Social History  Substance Use Topics  . Smoking status: Current Every Day Smoker    Years: 5.00    Types: Cigarettes  . Smokeless tobacco: Never Used     Comment: quit with + preg  . Alcohol use No     Allergies   Review of patient's allergies indicates no known allergies.   Review of Systems Review of Systems  Constitutional: Negative for fever.  Respiratory: Negative for cough and shortness of breath.   Cardiovascular: Negative for chest pain, palpitations and leg swelling.  Musculoskeletal: Positive for myalgias. Negative for joint swelling and neck pain.  Skin: Negative for color change,  rash and wound.  Neurological: Negative for weakness and numbness.   Physical Exam Updated Vital Signs BP 111/67 (BP Location: Left Arm)   Pulse 96   Temp 98.4 F (36.9 C) (Oral)   Resp 18   SpO2 100%   Physical Exam  Constitutional: She appears well-developed and well-nourished. No distress.  Nontoxic appearing.  HENT:  Head: Normocephalic and atraumatic.  Eyes: Right eye exhibits no discharge. Left eye exhibits no discharge.  Cardiovascular: Normal rate, regular rhythm and intact distal pulses.   Bilateral dorsalis pedis and posterior tibialis pulses are intact.  Pulmonary/Chest: Effort  normal. No respiratory distress.  Musculoskeletal: Normal range of motion. She exhibits no edema, tenderness or deformity.  No lower extremity edema or tenderness. No calf edema or tenderness. No pedal edema. No overlying skin changes to her lower extremities. No erythema or warmth. No evidence of cellulitis. Good strength to her bilateral lower extremities. Normal gait.  Neurological: She is alert. Coordination normal.  Sensation is intact in her bilateral lower extremities. Normal gait.  Skin: Skin is warm and dry. Capillary refill takes less than 2 seconds. No rash noted. She is not diaphoretic. No erythema. No pallor.  Psychiatric: She has a normal mood and affect. Her behavior is normal.  Nursing note and vitals reviewed.   ED Treatments / Results  Labs (all labs ordered are listed, but only abnormal results are displayed) Labs Reviewed - No data to display  EKG  EKG Interpretation None       Radiology No results found.  Procedures Procedures  DIAGNOSTIC STUDIES: Oxygen Saturation is 100% on RA, normal by my interpretation.    COORDINATION OF CARE: 6:10 PM Discussed next steps with pt. Pt verbalized understanding and is agreeable with the plan.    Medications Ordered in ED Medications - No data to display   Initial Impression / Assessment and Plan / ED Course  I have reviewed the triage vital signs and the nursing notes.  Pertinent labs & imaging results that were available during my care of the patient were reviewed by me and considered in my medical decision making (see chart for details).  Clinical Course   This is a 35 y.o. female who presents to the Emergency Department complaining of worsening, mild, bilateral, knee and leg pain x 3 days. Pt states that she stands and ambulates >12 hours per day for work which she believes is the cause of her symptoms. She reports associated, mild, bilateral knee stiffness that is worse after sitting. She reports sometimes her  legs swell, but not currently. No unilateral leg swelling. . She has taken ibuprofen which provides mild relief of her symptoms. She denies fevers, muscle cramping, chills, CP, SOB.  On exam the patient is afebrile nontoxic appearing. She has no lower extremity edema or tenderness. No overlying skin changes. No evidence of cellulitis. She denies any chest pain or shortness of breath. She reports her pain is worse after working all day. She reports pain is better with ibuprofen. I have low suspicion for DVT. Patient has no physical exam findings consistent with DVT. She has no lower extremity edema. Patient denies muscle cramping. I encouraged her to use naproxen for pain control rather than ibuprofen. I encouraged her to have her feet elevated when she is at home. I suggested lots of fluids. I discussed return precautions. I advised the patient to follow-up with their primary care provider this week. I advised the patient to return to the emergency department with new  or worsening symptoms or new concerns. The patient verbalized understanding and agreement with plan.    I personally performed the services described in this documentation, which was scribed in my presence. The recorded information has been reviewed and is accurate.      Final Clinical Impressions(s) / ED Diagnoses   Final diagnoses:  Bilateral leg pain    New Prescriptions New Prescriptions   NAPROXEN (NAPROSYN) 250 MG TABLET    Take 1 tablet (250 mg total) by mouth 2 (two) times daily with a meal.     Waynetta Pean, PA-C 05/20/16 1824    Wendy Julio Alm, MD 05/21/16 1756

## 2017-08-18 ENCOUNTER — Encounter (HOSPITAL_COMMUNITY): Payer: Self-pay | Admitting: Emergency Medicine

## 2017-08-18 ENCOUNTER — Ambulatory Visit (HOSPITAL_COMMUNITY)
Admission: EM | Admit: 2017-08-18 | Discharge: 2017-08-18 | Disposition: A | Discharge status: Home / Self Care | Payer: BLUE CROSS/BLUE SHIELD | Attending: Family Medicine | Admitting: Family Medicine

## 2017-08-18 DIAGNOSIS — K0889 Other specified disorders of teeth and supporting structures: Secondary | ICD-10-CM

## 2017-08-18 MED ORDER — PENICILLIN V POTASSIUM 500 MG PO TABS: 500.0000 mg | ORAL_TABLET | Freq: Four times a day (QID) | ORAL | 0 refills | Status: AC

## 2017-08-18 MED ORDER — BENZOCAINE 10 % MT GEL: 1.0000 "application " | OROMUCOSAL | 0 refills | Status: DC | PRN

## 2017-08-18 MED ORDER — NAPROXEN 500 MG PO TABS: 500.0000 mg | ORAL_TABLET | Freq: Two times a day (BID) | ORAL | 0 refills | Status: AC

## 2017-08-18 NOTE — ED Provider Notes (Signed)
[name]    CSN: [id number] Arrival date & time: 08/18/17  1721     History   Chief Complaint Chief Complaint  Patient presents with  . Dental Pain    HPI [name] is a 36 y.o. female.   36 year old female comes in for 3 day history of left upper dental pain. Patient has no cracked tooth in that region, has not been able to afford dental care. She has tried Tylenol, Goody powder with with little relief. She has mild facial swelling. Denies fever, chills, night sweats. Denies trouble breathing, swallowing, swelling of the throat.      Past Medical History:  Diagnosis Date  . Abnormal Pap smear   . Chlamydia   . Gonorrhea   . Headache(784.0)   . HPV (human papilloma virus) infection   . Trichomonas   . Urinary tract infection     Patient Active Problem List   Diagnosis Date Noted  . Maternal anemia complicating pregnancy, childbirth, or the puerperium 09/02/2011  . Prolonged latent phase of labor 08/31/2011  . Cesarean delivery, without mention of indication, delivered, with or without mention of antepartum condition 08/31/2011  . Preterm premature rupture of membranes 08/27/2011  . Gonorrhea   . Chlamydia     Past Surgical History:  Procedure Laterality Date  . CESAREAN SECTION  08/31/2011   Procedure: CESAREAN SECTION;  Surgeon: Agnes Lawrence, MD;  Location: [facility];  Service: Gynecology;  Laterality: N/A;  Primary Cesarean section Baby Boy @ 1418  . COLPOSCOPY  2010  . LEEP      OB History    Gravida Para Term Preterm AB Living   3 3 2 1   3    SAB TAB Ectopic Multiple Live Births           3       Home Medications    Prior to Admission medications   Medication Sig Start Date End Date Taking? Authorizing Provider  benzocaine (ORAJEL) 10 % mucosal gel Use as directed 1 application in the mouth or throat as needed for mouth pain. 08/18/17   Tasia Catchings, Amy V, PA-C  medroxyPROGESTERone (DEPO-PROVERA) 150 MG/ML injection Inject 150  mg into the muscle every 3 (three) months.    [provider]  naproxen (NAPROSYN) 500 MG tablet Take 1 tablet (500 mg total) by mouth 2 (two) times daily. 08/18/17 08/28/17  Tasia Catchings, Amy V, PA-C  penicillin v potassium (VEETID) 500 MG tablet Take 1 tablet (500 mg total) by mouth 4 (four) times daily. 08/18/17 08/25/17  Ok Edwards, PA-C    Family History No family history on file.  Social History Social History  Substance Use Topics  . Smoking status: Current Every Day Smoker    Packs/day: 0.20    Years: 5.00    Types: Cigarettes  . Smokeless tobacco: Never Used     Comment: quit with + preg  . Alcohol use No     Allergies   Patient has no known allergies.   Review of Systems Review of Systems  Reason unable to perform ROS: See HPI as above.     Physical Exam Triage Vital Signs ED Triage Vitals  Enc Vitals Group     BP 08/18/17 1822 120/65     Pulse Rate 08/18/17 1822 73     Resp 08/18/17 1822 16     Temp 08/18/17 1822 98.5 F (36.9 C)     Temp Source 08/18/17 1822 Oral  SpO2 08/18/17 1822 100 %     Weight 08/18/17 1820 155 lb (70.3 kg)     Height 08/18/17 1820 5\' 8"  (1.727 m)     Head Circumference --      Peak Flow --      Pain Score 08/18/17 1821 10     Pain Loc --      Pain Edu? --      Excl. in [place]? --    No data found.   Updated Vital Signs BP 120/65   Pulse 73   Temp 98.5 F (36.9 C) (Oral)   Resp 16   Ht 5\' 8"  (1.727 m)   Wt 155 lb (70.3 kg)   SpO2 100%   BMI 23.57 kg/m   Physical Exam  Constitutional: She is oriented to person, place, and time. She appears well-developed and well-nourished. No distress.  HENT:  Head: Normocephalic and atraumatic.  Mouth/Throat: Uvula is midline, oropharynx is clear and moist and mucous membranes are normal.    Left upper cracked tooth. Tenderness on palpation along the gums.   Floor of mouth soft. No obvious swelling of the face.   Eyes: Pupils are equal, round, and reactive to light.  Conjunctivae are normal.  Neurological: She is alert and oriented to person, place, and time.     UC Treatments / Results  Labs (all labs ordered are listed, but only abnormal results are displayed) Labs Reviewed - No data to display  EKG  EKG Interpretation None       Radiology No results found.  Procedures Procedures (including critical care time)  Medications Ordered in UC Medications - No data to display   Initial Impression / Assessment and Plan / UC Course  I have reviewed the triage vital signs and the nursing notes.  Pertinent labs & imaging results that were available during my care of the patient were reviewed by me and considered in my medical decision making (see chart for details).    Start antibiotics for possible dental abscess. Symptomatic treatment as needed. Discussed with patient symptoms can return if dental problem is not addressed. Follow up with dentist for further evaluation and treatment of dental pain. Resources given. Return precautions given.    Final Clinical Impressions(s) / UC Diagnoses   Final diagnoses:  Pain, dental    New Prescriptions Discharge Medication List as of 08/18/2017  6:52 PM    START taking these medications   Details  benzocaine (ORAJEL) 10 % mucosal gel Use as directed 1 application in the mouth or throat as needed for mouth pain., Starting Wed 08/18/2017, Normal    penicillin v potassium (VEETID) 500 MG tablet Take 1 tablet (500 mg total) by mouth 4 (four) times daily., Starting Wed 08/18/2017, Until Wed 08/25/2017, Normal          Ok Edwards, [place] 08/18/17 1857

## 2017-08-18 NOTE — Discharge Instructions (Signed)
Start Penicillin as directed for dental abscess. Naproxen and oragel for pain. Follow up with dentist for further treatment and evaluation. If experiencing swelling of the throat, trouble breathing, trouble swallowing, facial swelling despite antibiotics, go to the emergency department for further evaluation.

## 2017-08-18 NOTE — ED Notes (Signed)
Patient discharged by provider Vanessa Kick, MD

## 2017-08-18 NOTE — ED Triage Notes (Signed)
PT reports pain in left upper jaw. PT is trying to save money to afford extraction. Pain is intermittent. This episode has gone on for 3 days.

## 2018-05-22 ENCOUNTER — Other Ambulatory Visit: Payer: Self-pay

## 2018-05-22 ENCOUNTER — Encounter (HOSPITAL_COMMUNITY): Payer: Self-pay | Admitting: Emergency Medicine

## 2018-05-22 ENCOUNTER — Ambulatory Visit (HOSPITAL_COMMUNITY)
Admission: EM | Admit: 2018-05-22 | Discharge: 2018-05-22 | Disposition: A | Discharge status: Home / Self Care | Payer: BLUE CROSS/BLUE SHIELD | Attending: Internal Medicine | Admitting: Internal Medicine

## 2018-05-22 DIAGNOSIS — K047 Periapical abscess without sinus: Secondary | ICD-10-CM

## 2018-05-22 MED ORDER — IBUPROFEN 600 MG PO TABS: 600.0000 mg | ORAL_TABLET | Freq: Four times a day (QID) | ORAL | 0 refills | Status: DC | PRN

## 2018-05-22 MED ORDER — AMOXICILLIN-POT CLAVULANATE 875-125 MG PO TABS: 1.0000 | ORAL_TABLET | Freq: Two times a day (BID) | ORAL | 0 refills | Status: AC

## 2018-05-22 MED ORDER — HYDROCODONE-ACETAMINOPHEN 5-325 MG PO TABS: 1.0000 | ORAL_TABLET | Freq: Four times a day (QID) | ORAL | 0 refills | Status: DC | PRN

## 2018-05-22 NOTE — ED Triage Notes (Signed)
Top, left tooth is hurting.  Onset Friday of toothache, today this pain is significantly worse

## 2018-05-22 NOTE — Discharge Instructions (Addendum)

## 2018-05-22 NOTE — ED Provider Notes (Signed)
[name]    CSN: [id number] Arrival date & time: 05/22/18  1310     History   Chief Complaint Chief Complaint  Patient presents with  . Dental Pain    HPI [name] is a 37 y.o. female no contributing past medical history presenting today for evaluation of dental pain.  Patient states that her left upper jaw is hurting near tooth that is cracked.  States that symptoms have been going on for approximately 3 days.  She has been taking Tylenol and ibuprofen without relief.  She has noticed some mild swelling around the tooth into her face, but believes the swelling has gone down at the time of visit.  She does not have plans to follow-up with dentistry as she does not have dental insurance.  She denies difficulty moving neck.  She denies difficulty swallowing, but does endorse difficulty eating food.  HPI  Past Medical History:  Diagnosis Date  . Abnormal Pap smear   . Chlamydia   . Gonorrhea   . Headache(784.0)   . HPV (human papilloma virus) infection   . Trichomonas   . Urinary tract infection     Patient Active Problem List   Diagnosis Date Noted  . Maternal anemia complicating pregnancy, childbirth, or the puerperium 09/02/2011  . Prolonged latent phase of labor 08/31/2011  . Cesarean delivery, without mention of indication, delivered, with or without mention of antepartum condition 08/31/2011  . Preterm premature rupture of membranes 08/27/2011  . Gonorrhea   . Chlamydia     Past Surgical History:  Procedure Laterality Date  . CESAREAN SECTION  08/31/2011   Procedure: CESAREAN SECTION;  Surgeon: Wendy Lawrence, MD;  Location: [facility];  Service: Gynecology;  Laterality: N/A;  Primary Cesarean section Baby Boy @ 1418  . COLPOSCOPY  2010  . LEEP      OB History    Gravida  3   Para  3   Term  2   Preterm  1   AB      Living  3     SAB      TAB      Ectopic      Multiple      Live Births  3            Home  Medications    Prior to Admission medications   Medication Sig Start Date End Date Taking? Authorizing Provider  amoxicillin-clavulanate (AUGMENTIN) 875-125 MG tablet Take 1 tablet by mouth every 12 (twelve) hours for 7 days. 05/22/18 05/29/18  [name]  benzocaine (ORAJEL) 10 % mucosal gel Use as directed 1 application in the mouth or throat as needed for mouth pain. 08/18/17   [name]  HYDROcodone-acetaminophen (NORCO/VICODIN) 5-325 MG tablet Take 1 tablet by mouth every 6 (six) hours as needed. 05/22/18   [name]  ibuprofen (ADVIL,MOTRIN) 600 MG tablet Take 1 tablet (600 mg total) by mouth every 6 (six) hours as needed. 05/22/18   [name] Perry, Perry  medroxyPROGESTERone (DEPO-PROVERA) 150 MG/ML injection Inject 150 mg into the muscle every 3 (three) months.    [provider]    Family History Family History  Problem Relation Age of Onset  . Hypertension Mother   . Diabetes Mother   . Stroke Mother     Social History Social History   Tobacco Use  . Smoking status: Current Every Day Smoker    Packs/day: 0.20  Years: 5.00    Pack years: 1.00    Types: Cigarettes  . Smokeless tobacco: Never Used  . Tobacco comment: quit with + preg  Substance Use Topics  . Alcohol use: No  . Drug use: No     Allergies   Patient has no known allergies.   Review of Systems Review of Systems  Constitutional: Negative for activity change, appetite change, chills, fatigue and fever.  HENT: Positive for dental problem. Negative for congestion, ear pain, rhinorrhea, sinus pressure, sore throat and trouble swallowing.   Eyes: Negative for discharge and redness.  Respiratory: Negative for cough, chest tightness and shortness of breath.   Cardiovascular: Negative for chest pain.  Gastrointestinal: Negative for abdominal pain, diarrhea, nausea and vomiting.  Musculoskeletal: Negative for myalgias.  Skin: Negative for rash.  Neurological:  Negative for dizziness, light-headedness and headaches.     Physical Exam Triage Vital Signs ED Triage Vitals  Enc Vitals Group     BP 05/22/18 1408 117/72     Pulse Rate 05/22/18 1408 85     Resp 05/22/18 1408 18     Temp 05/22/18 1408 98.3 F (36.8 Perry)     Temp Source 05/22/18 1408 Oral     SpO2 05/22/18 1408 100 %     Weight --      Height --      Head Circumference --      Peak Flow --      Pain Score 05/22/18 1406 8     Pain Loc --      Pain Edu? --      Excl. in [place]? --    No data found.  Updated Vital Signs BP 117/72 (BP Location: Left Arm)   Pulse 85   Temp 98.3 F (36.8 Perry) (Oral)   Resp 18   SpO2 100%   Visual Acuity Right Eye Distance:   Left Eye Distance:   Bilateral Distance:    Right Eye Near:   Left Eye Near:    Bilateral Near:     Physical Exam  Constitutional: She appears well-developed and well-nourished. No distress.  HENT:  Head: Normocephalic and atraumatic.  Oral mucosa pink and moist, no tonsillar enlargement or exudate. Posterior pharynx patent and nonerythematous, no uvula deviation or swelling. Normal phonation.  2 fractured molars to left upper posterior jaw, gingival tenderness, mild facial swelling to left upper jaw.  No soft palate swelling  Eyes: Conjunctivae are normal.  Neck: Neck supple.  Full active range of motion of neck, no neck erythema or swelling  Cardiovascular: Normal rate and regular rhythm.  No murmur heard. Pulmonary/Chest: Effort normal and breath sounds normal. No respiratory distress.  Abdominal: Soft. There is no tenderness.  Musculoskeletal: She exhibits no edema.  Neurological: She is alert.  Skin: Skin is warm and dry.  Psychiatric: She has a normal mood and affect.  Nursing note and vitals reviewed.    UC Treatments / Results  Labs (all labs ordered are listed, but only abnormal results are displayed) Labs Reviewed - No data to display  EKG None  Radiology No results  found.  Procedures Procedures (including critical care time)  Medications Ordered in UC Medications - No data to display  Initial Impression / Assessment and Plan / UC Course  I have reviewed the triage vital signs and the nursing notes.  Pertinent labs & imaging results that were available during my care of the patient were reviewed by me and considered in my medical decision  making (see chart for details).     Patient with dental pain, possible infection/abscess.  Will treat with Augmentin, Tylenol and ibuprofen for mild to moderate pain.  Provided 6 tablets of hydrocodone to use for severe pain.  Provided dental resource to seek permanent treatment.Discussed strict return precautions. Patient verbalized understanding and is agreeable with plan.  Final Clinical Impressions(s) / UC Diagnoses   Final diagnoses:  Dental abscess     Discharge Instructions     Please use dental resource to contact offices to seek permenant treatment/relief.   Today we have given you an antibiotic. This should help with pain as any infection is cleared.   For pain please take 600mg -800mg  of Ibuprofen every 8 hours, take with 1000 mg of Tylenol Extra strength every 8 hours. These are safe to take together. Please take with food.   I have also provided 2 days worth of stronger pain medication. This should only be used for severe pain. Do not drive or operate machinery while taking this medication.   Please return if you start to experience significant swelling of your face, experiencing fever.   ED Prescriptions    Medication Sig Dispense Auth. Provider   amoxicillin-clavulanate (AUGMENTIN) 875-125 MG tablet Take 1 tablet by mouth every 12 (twelve) hours for 7 days. 14 tablet Adriano Bischof Perry, Perry   ibuprofen (ADVIL,MOTRIN) 600 MG tablet Take 1 tablet (600 mg total) by mouth every 6 (six) hours as needed. 30 tablet Maxie Slovacek Perry, Perry   HYDROcodone-acetaminophen (NORCO/VICODIN) 5-325 MG  tablet Take 1 tablet by mouth every 6 (six) hours as needed. 6 tablet Japhet Morgenthaler, Franklin Perry, Perry     Controlled Substance Prescriptions [place] Controlled Substance Registry consulted? Yes, I have consulted the [place] Controlled Substances Registry for this patient, and feel the risk/benefit ratio today is favorable for proceeding with this prescription for a controlled substance.   Janith Lima, [place] 05/22/18 1925

## 2018-06-01 DIAGNOSIS — Z3009 Encounter for other general counseling and advice on contraception: Secondary | ICD-10-CM | POA: Diagnosis not present

## 2018-06-01 DIAGNOSIS — Z113 Encounter for screening for infections with a predominantly sexual mode of transmission: Secondary | ICD-10-CM | POA: Diagnosis not present

## 2018-11-30 ENCOUNTER — Inpatient Hospital Stay (HOSPITAL_COMMUNITY)
Admission: AD | Admit: 2018-11-30 | Discharge: 2018-11-30 | Disposition: A | Discharge status: Home / Self Care | Payer: BLUE CROSS/BLUE SHIELD | Attending: Obstetrics and Gynecology | Admitting: Obstetrics and Gynecology

## 2018-11-30 ENCOUNTER — Encounter (HOSPITAL_COMMUNITY): Payer: Self-pay | Admitting: *Deleted

## 2018-11-30 DIAGNOSIS — F17210 Nicotine dependence, cigarettes, uncomplicated: Secondary | ICD-10-CM | POA: Insufficient documentation

## 2018-11-30 DIAGNOSIS — N3000 Acute cystitis without hematuria: Secondary | ICD-10-CM

## 2018-11-30 DIAGNOSIS — M549 Dorsalgia, unspecified: Secondary | ICD-10-CM | POA: Insufficient documentation

## 2018-11-30 DIAGNOSIS — R109 Unspecified abdominal pain: Secondary | ICD-10-CM | POA: Insufficient documentation

## 2018-11-30 DIAGNOSIS — Z3202 Encounter for pregnancy test, result negative: Secondary | ICD-10-CM | POA: Diagnosis not present

## 2018-11-30 LAB — URINALYSIS, ROUTINE W REFLEX MICROSCOPIC
GLUCOSE, UA: NEGATIVE mg/dL
KETONES UR: NEGATIVE mg/dL
Leukocytes, UA: NEGATIVE
Nitrite: NEGATIVE
Protein, ur: NEGATIVE mg/dL
Specific Gravity, Urine: 1.02 (ref 1.005–1.030)
pH: 6 (ref 5.0–8.0)

## 2018-11-30 LAB — URINALYSIS, MICROSCOPIC (REFLEX)

## 2018-11-30 LAB — POCT PREGNANCY, URINE: Preg Test, Ur: NEGATIVE

## 2018-11-30 MED ORDER — NITROFURANTOIN MONOHYD MACRO 100 MG PO CAPS: 100.0000 mg | ORAL_CAPSULE | Freq: Two times a day (BID) | ORAL | 0 refills | Status: DC

## 2018-11-30 MED ORDER — NITROFURANTOIN MONOHYD MACRO 100 MG PO CAPS: 100.0000 mg | ORAL_CAPSULE | Freq: Two times a day (BID) | ORAL | 0 refills | Status: AC

## 2018-11-30 NOTE — Discharge Instructions (Signed)
On December 18, 2018, the [facility] will be moving to the [employer] At that time, the MAU (Maternity Admissions Unit), where you are being seen today, will no longer take care of non-pregnant patients. We strongly encourage you to find a doctor's office before that time, so that you can be seen with any GYN concerns, like vaginal discharge, urinary tract infection, etc.. in a timely manner.  In order to make an office visit more convenient, the [facility] [place] [facility] will be offering evening hours with same-day appointments, walk-in appointments and scheduled appointments available during this time.  Center for [facility] @ [facility] Hours: Monday - 8am - 7:30 pm with walk-in between 4pm- 7:30 pm Tuesday - 8 am - 7:30 pm with walk-in between 4pm- 7:30 pm Wednesday - 8 am - 7:30 pm with walk-in between 4pm- 7:30 pm Thursday 8 am - 7:30 pm with walk-in between 4pm- 7:30 pm Friday 8 am - 12  For an appointment please call the [facility] [place] @ [facility] at [phone]  For urgent needs, [facility] is also available for management of urgent GYN complaints such as vaginal discharge or urinary tract infections.  Primary care follow up  [facility] (will see you even if you do not have sickle cell): [phone] [facility] Internal Medicine: [phone] [facility]: [place] [phone]      Urinary Tract Infection, Adult  A urinary tract infection (UTI) is an infection of any part of the urinary tract. The urinary tract includes the kidneys, ureters, bladder, and urethra. These organs make, store, and get rid of urine in the body. Your health care provider may use other names to describe the infection. An upper UTI affects the ureters and kidneys (pyelonephritis). A lower UTI affects the bladder (cystitis) and urethra (urethritis). What are the  causes? Most urinary tract infections are caused by bacteria in your genital area, around the entrance to your urinary tract (urethra). These bacteria grow and cause inflammation of your urinary tract. What increases the risk? You are more likely to develop this condition if:  You have a urinary catheter that stays in place (indwelling).  You are not able to control when you urinate or have a bowel movement (you have incontinence).  You are female and you: ? Use a spermicide or diaphragm for birth control. ? Have low estrogen levels. ? Are pregnant.  You have certain genes that increase your risk (genetics).  You are sexually active.  You take antibiotic medicines.  You have a condition that causes your flow of urine to slow down, such as: ? An enlarged prostate, if you are female. ? Blockage in your urethra (stricture). ? A kidney stone. ? A nerve condition that affects your bladder control (neurogenic bladder). ? Not getting enough to drink, or not urinating often.  You have certain medical conditions, such as: ? Diabetes. ? A weak disease-fighting system (immunesystem). ? Sickle cell disease. ? Gout. ? Spinal cord injury. What are the signs or symptoms? Symptoms of this condition include:  Needing to urinate right away (urgently).  Frequent urination or passing small amounts of urine frequently.  Pain or burning with urination.  Blood in the urine.  Urine that smells bad or unusual.  Trouble urinating.  Cloudy urine.  Vaginal discharge, if you are female.  Pain in the abdomen or the lower back. You may also have:  Vomiting or a  decreased appetite.  Confusion.  Irritability or tiredness.  A fever.  Diarrhea. The first symptom in older adults may be confusion. In some cases, they may not have any symptoms until the infection has worsened. How is this diagnosed? This condition is diagnosed based on your medical history and a physical exam. You may also  have other tests, including:  Urine tests.  Blood tests.  Tests for sexually transmitted infections (STIs). If you have had more than one UTI, a cystoscopy or imaging studies may be done to determine the cause of the infections. How is this treated? Treatment for this condition includes:  Antibiotic medicine.  Over-the-counter medicines to treat discomfort.  Drinking enough water to stay hydrated. If you have frequent infections or have other conditions such as a kidney stone, you may need to see a health care provider who specializes in the urinary tract (urologist). In rare cases, urinary tract infections can cause sepsis. Sepsis is a life-threatening condition that occurs when the body responds to an infection. Sepsis is treated in the hospital with IV antibiotics, fluids, and other medicines. Follow these instructions at home:  Medicines  Take over-the-counter and prescription medicines only as told by your health care provider.  If you were prescribed an antibiotic medicine, take it as told by your health care provider. Do not stop using the antibiotic even if you start to feel better. General instructions  Make sure you: ? Empty your bladder often and completely. Do not hold urine for long periods of time. ? Empty your bladder after sex. ? Wipe from front to back after a bowel movement if you are female. Use each tissue one time when you wipe.  Drink enough fluid to keep your urine pale yellow.  Keep all follow-up visits as told by your health care provider. This is important. Contact a health care provider if:  Your symptoms do not get better after 1-2 days.  Your symptoms go away and then return. Get help right away if you have:  Severe pain in your back or your lower abdomen.  A fever.  Nausea or vomiting. Summary  A urinary tract infection (UTI) is an infection of any part of the urinary tract, which includes the kidneys, ureters, bladder, and  urethra.  Most urinary tract infections are caused by bacteria in your genital area, around the entrance to your urinary tract (urethra).  Treatment for this condition often includes antibiotic medicines.  If you were prescribed an antibiotic medicine, take it as told by your health care provider. Do not stop using the antibiotic even if you start to feel better.  Keep all follow-up visits as told by your health care provider. This is important. This information is not intended to replace advice given to you by your health care provider. Make sure you discuss any questions you have with your health care provider. Document Released: 07/22/2005 Document Revised: 04/21/2018 Document Reviewed: 04/21/2018 Elsevier Interactive Patient Education  2019 [employer].

## 2018-11-30 NOTE — MAU Note (Signed)
Pt reports lower abd pain x one week, urinary frequency. Nausea, no vomiting.

## 2018-11-30 NOTE — MAU Provider Note (Signed)
History     CSN: [id number]  Arrival date and time: 11/30/18 5631   First Provider Initiated Contact with Patient 11/30/18 [id number]      Chief Complaint  Patient presents with  . Abdominal Pain  . Back Pain  . Urinary Frequency   [name] is a 38 y.o. [phone] who presents today with urinary frequency, and dysuria x 1 week.  Dysuria   This is a new problem. The current episode started in the past 7 days. The problem occurs every urination. The problem has been unchanged. The quality of the pain is described as burning. The pain is moderate. There has been no fever. She is sexually active. There is no history of pyelonephritis. Associated symptoms include frequency and nausea. Pertinent negatives include no chills or vomiting. She has tried nothing for the symptoms.    OB History    Gravida  3   Para  3   Term  2   Preterm  1   AB      Living  3     SAB      TAB      Ectopic      Multiple      Live Births  3           Past Medical History:  Diagnosis Date  . Abnormal Pap smear   . Chlamydia   . Gonorrhea   . Headache(784.0)   . HPV (human papilloma virus) infection   . Trichomonas   . Urinary tract infection     Past Surgical History:  Procedure Laterality Date  . CESAREAN SECTION  08/31/2011   Procedure: CESAREAN SECTION;  Surgeon: Agnes Lawrence, MD;  Location: [facility];  Service: Gynecology;  Laterality: N/A;  Primary Cesarean section Baby Boy @ 1418  . COLPOSCOPY  2010  . LEEP      Family History  Problem Relation Age of Onset  . Hypertension Mother   . Diabetes Mother   . Stroke Mother     Social History   Tobacco Use  . Smoking status: Current Every Day Smoker    Packs/day: 0.20    Years: 5.00    Pack years: 1.00    Types: Cigarettes  . Smokeless tobacco: Never Used  . Tobacco comment: quit with + preg  Substance Use Topics  . Alcohol use: No  . Drug use: No    Allergies: No Known Allergies  Medications Prior to  Admission  Medication Sig Dispense Refill Last Dose  . benzocaine (ORAJEL) 10 % mucosal gel Use as directed 1 application in the mouth or throat as needed for mouth pain. 5.3 g 0   . HYDROcodone-acetaminophen (NORCO/VICODIN) 5-325 MG tablet Take 1 tablet by mouth every 6 (six) hours as needed. 6 tablet 0   . ibuprofen (ADVIL,MOTRIN) 600 MG tablet Take 1 tablet (600 mg total) by mouth every 6 (six) hours as needed. 30 tablet 0   . medroxyPROGESTERone (DEPO-PROVERA) 150 MG/ML injection Inject 150 mg into the muscle every 3 (three) months.   More than a month at Unknown time    Review of Systems  Constitutional: Negative for chills and fever.  Gastrointestinal: Positive for nausea. Negative for vomiting.  Genitourinary: Positive for dysuria and frequency. Negative for vaginal bleeding and vaginal discharge.   Physical Exam   Blood pressure 122/61, pulse 92, temperature 98.1 F (36.7 C), temperature source Oral, resp. rate 16, height 5\' 8"  (1.727 m), weight 73 kg, SpO2 99 %.  Physical Exam  Nursing note and vitals reviewed. Constitutional: She is oriented to person, place, and time. She appears well-developed and well-nourished. No distress.  HENT:  Head: Normocephalic.  Cardiovascular: Normal rate.  Respiratory: Effort normal.  GI: Soft. There is no abdominal tenderness.  Neurological: She is alert and oriented to person, place, and time.  Skin: Skin is warm and dry.  Psychiatric: She has a normal mood and affect.     Results for orders placed or performed during the hospital encounter of 11/30/18 (from the past 24 hour(s))  Urinalysis, Routine w reflex microscopic     Status: Abnormal   Collection Time: 11/30/18  8:28 AM  Result Value Ref Range   Color, Urine YELLOW YELLOW   APPearance CLEAR CLEAR   Specific Gravity, Urine 1.020 1.005 - 1.030   pH 6.0 5.0 - 8.0   Glucose, UA NEGATIVE NEGATIVE mg/dL   Hgb urine dipstick TRACE (A) NEGATIVE   Bilirubin Urine SMALL (A) NEGATIVE    Ketones, ur NEGATIVE NEGATIVE mg/dL   Protein, ur NEGATIVE NEGATIVE mg/dL   Nitrite NEGATIVE NEGATIVE   Leukocytes, UA NEGATIVE NEGATIVE  Urinalysis, Microscopic (reflex)     Status: Abnormal   Collection Time: 11/30/18  8:28 AM  Result Value Ref Range   RBC / HPF 0-5 0 - 5 RBC/hpf   WBC, UA 0-5 0 - 5 WBC/hpf   Bacteria, UA FEW (A) NONE SEEN   Squamous Epithelial / LPF 6-10 0 - 5   Mucus PRESENT   Pregnancy, urine POC     Status: None   Collection Time: 11/30/18  8:32 AM  Result Value Ref Range   Preg Test, Ur NEGATIVE NEGATIVE     MAU Course  Procedures  MDM   Assessment and Plan   1. Acute cystitis without hematuria    DC home Comfort measures reviewed  RX: macrobid BID x 5 days  Return to MAU as needed FU with PCP as needed   Follow-up Information    Department, [facility] Follow up.   Contact information: [place] [address] [phone]

## 2018-12-01 LAB — URINE CULTURE: CULTURE: NO GROWTH

## 2018-12-10 ENCOUNTER — Encounter (HOSPITAL_COMMUNITY): Payer: Self-pay | Admitting: Emergency Medicine

## 2018-12-10 ENCOUNTER — Other Ambulatory Visit: Payer: Self-pay

## 2018-12-10 ENCOUNTER — Emergency Department (HOSPITAL_COMMUNITY)
Admission: EM | Admit: 2018-12-10 | Discharge: 2018-12-11 | Disposition: A | Discharge status: Home / Self Care | Payer: BLUE CROSS/BLUE SHIELD | Attending: Emergency Medicine | Admitting: Emergency Medicine

## 2018-12-10 DIAGNOSIS — B029 Zoster without complications: Secondary | ICD-10-CM | POA: Insufficient documentation

## 2018-12-10 DIAGNOSIS — F17210 Nicotine dependence, cigarettes, uncomplicated: Secondary | ICD-10-CM | POA: Diagnosis not present

## 2018-12-10 DIAGNOSIS — R21 Rash and other nonspecific skin eruption: Secondary | ICD-10-CM | POA: Diagnosis not present

## 2018-12-10 MED ORDER — IBUPROFEN 800 MG PO TABS: 800.0000 mg | ORAL_TABLET | Freq: Three times a day (TID) | ORAL | 0 refills | Status: DC

## 2018-12-10 MED ORDER — VALACYCLOVIR HCL 1 G PO TABS: 1000.0000 mg | ORAL_TABLET | Freq: Three times a day (TID) | ORAL | 0 refills | Status: AC

## 2018-12-10 NOTE — ED Notes (Signed)
Bed: WLPT3 Expected date:  Expected time:  Means of arrival:  Comments: 

## 2018-12-10 NOTE — ED Notes (Signed)
Bed: WLPT2 Expected date:  Expected time:  Means of arrival:  Comments: 

## 2018-12-10 NOTE — ED Provider Notes (Signed)
[facility] DEPT Provider Note   CSN: [id number] Arrival date & time: 12/10/18  2155     History   Chief Complaint Chief Complaint  Patient presents with  . Rash  . Headache    HPI [name] is a 38 y.o. female.  Patient with no pertinent PMH presents to the emergency department with a chief complaint of rash.  She states that she has a burning and tingling or rash on her right flank.  States that she first noticed the symptoms 2 days ago.  Denies any other associated symptoms.  Denies any fevers, chills, nausea, vomiting, diarrhea.  She is uncertain whether she had chickenpox as a child.  She has not tried any treatments prior to arrival.  The history is provided by the patient. No language interpreter was used.    Past Medical History:  Diagnosis Date  . Abnormal Pap smear   . Chlamydia   . Gonorrhea   . Headache(784.0)   . HPV (human papilloma virus) infection   . Trichomonas   . Urinary tract infection     Patient Active Problem List   Diagnosis Date Noted  . Maternal anemia complicating pregnancy, childbirth, or the puerperium 09/02/2011  . Prolonged latent phase of labor 08/31/2011  . Cesarean delivery, without mention of indication, delivered, with or without mention of antepartum condition 08/31/2011  . Preterm premature rupture of membranes 08/27/2011  . Gonorrhea   . Chlamydia     Past Surgical History:  Procedure Laterality Date  . CESAREAN SECTION  08/31/2011   Procedure: CESAREAN SECTION;  Surgeon: Agnes Lawrence, MD;  Location: [facility];  Service: Gynecology;  Laterality: N/A;  Primary Cesarean section Baby Boy @ 1418  . COLPOSCOPY  2010  . LEEP       OB History    Gravida  3   Para  3   Term  2   Preterm  1   AB      Living  3     SAB      TAB      Ectopic      Multiple      Live Births  3            Home Medications    Prior to Admission medications   Medication Sig Start  Date End Date Taking? Authorizing Provider  benzocaine (ORAJEL) 10 % mucosal gel Use as directed 1 application in the mouth or throat as needed for mouth pain. 08/18/17   Tasia Catchings, Amy V, PA-C  ibuprofen (ADVIL,MOTRIN) 800 MG tablet Take 1 tablet (800 mg total) by mouth 3 (three) times daily. 12/10/18   Montine Circle, PA-C  medroxyPROGESTERone (DEPO-PROVERA) 150 MG/ML injection Inject 150 mg into the muscle every 3 (three) months.    [provider]  valACYclovir (VALTREX) 1000 MG tablet Take 1 tablet (1,000 mg total) by mouth 3 (three) times daily for 7 days. 12/10/18 12/17/18  Montine Circle, PA-C    Family History Family History  Problem Relation Age of Onset  . Hypertension Mother   . Diabetes Mother   . Stroke Mother     Social History Social History   Tobacco Use  . Smoking status: Current Every Day Smoker    Packs/day: 0.20    Years: 5.00    Pack years: 1.00    Types: Cigarettes  . Smokeless tobacco: Never Used  . Tobacco comment: quit with + preg  Substance Use Topics  . Alcohol  use: No  . Drug use: No     Allergies   Patient has no known allergies.   Review of Systems Review of Systems  All other systems reviewed and are negative.    Physical Exam Updated Vital Signs BP 110/73 (BP Location: Left Arm)   Pulse 78   Temp 98.4 F (36.9 C) (Oral)   Resp 18   Ht 5\' 8"  (1.727 m)   Wt 70.3 kg   LMP 12/03/2018   SpO2 100%   BMI 23.57 kg/m   Physical Exam Vitals signs and nursing note reviewed.  Constitutional:      Appearance: She is well-developed.  HENT:     Head: Normocephalic and atraumatic.  Eyes:     Conjunctiva/sclera: Conjunctivae normal.     Pupils: Pupils are equal, round, and reactive to light.  Neck:     Musculoskeletal: Normal range of motion and neck supple.  Cardiovascular:     Rate and Rhythm: Normal rate and regular rhythm.     Heart sounds: No murmur. No friction rub. No gallop.   Pulmonary:     Effort: Pulmonary effort is  normal. No respiratory distress.     Breath sounds: Normal breath sounds. No wheezing or rales.  Chest:     Chest wall: No tenderness.  Abdominal:     General: Bowel sounds are normal. There is no distension.     Palpations: Abdomen is soft. There is no mass.     Tenderness: There is no abdominal tenderness. There is no guarding or rebound.  Musculoskeletal: Normal range of motion.        General: No tenderness.  Skin:    General: Skin is warm and dry.     Comments: Papular rash following L3 dermatome on right flank, unilateral  Neurological:     Mental Status: She is alert and oriented to person, place, and time.  Psychiatric:        Behavior: Behavior normal.        Thought Content: Thought content normal.        Judgment: Judgment normal.      ED Treatments / Results  Labs (all labs ordered are listed, but only abnormal results are displayed) Labs Reviewed - No data to display  EKG None  Radiology No results found.  Procedures Procedures (including critical care time)  Medications Ordered in ED Medications - No data to display   Initial Impression / Assessment and Plan / ED Course  I have reviewed the triage vital signs and the nursing notes.  Pertinent labs & imaging results that were available during my care of the patient were reviewed by me and considered in my medical decision making (see chart for details).     Patient with rash on right flank.  Appearance and symptoms is consistent with shingles.  She is within the treatment window.  Will start Valtrex.  No concerning comorbidities.  Discussed at risk populations.  Final Clinical Impressions(s) / ED Diagnoses   Final diagnoses:  Herpes zoster without complication    ED Discharge Orders         Ordered    valACYclovir (VALTREX) 1000 MG tablet  3 times daily     12/10/18 2345    ibuprofen (ADVIL,MOTRIN) 800 MG tablet  3 times daily     12/10/18 2346           Montine Circle, PA-C 12/10/18  2351    Rolland Porter, MD 12/11/18 [phone]

## 2018-12-10 NOTE — ED Triage Notes (Signed)
Patient states she had a rash that started on Thursday. Patient states she started off with one. Patient is also complaining of a headache also.

## 2018-12-12 ENCOUNTER — Ambulatory Visit
Admission: EM | Admit: 2018-12-12 | Discharge: 2018-12-12 | Disposition: A | Discharge status: Home / Self Care | Payer: BLUE CROSS/BLUE SHIELD | Attending: Family Medicine | Admitting: Family Medicine

## 2018-12-12 ENCOUNTER — Encounter: Payer: Self-pay | Admitting: Emergency Medicine

## 2018-12-12 DIAGNOSIS — B029 Zoster without complications: Secondary | ICD-10-CM | POA: Diagnosis not present

## 2018-12-12 MED ORDER — TRIAMCINOLONE ACETONIDE 0.1 % EX CREA: 1.0000 "application " | TOPICAL_CREAM | Freq: Two times a day (BID) | CUTANEOUS | 0 refills | Status: DC

## 2018-12-12 MED ORDER — CETIRIZINE HCL 10 MG PO CAPS: 10.0000 mg | ORAL_CAPSULE | Freq: Every day | ORAL | 0 refills | Status: DC

## 2018-12-12 NOTE — ED Triage Notes (Signed)
Pt presents to [facility] for assessment after being diagnosed with shingles on Saturday.  States she is having burning and itching and that the medication she was given makes her sleepy and gives her a headache.

## 2018-12-12 NOTE — Discharge Instructions (Addendum)
Continue Valtrex Use anti-inflammatories for pain/swelling. You may take up to 800 mg Ibuprofen every 8 hours with food. You may supplement Ibuprofen with Tylenol [phone] mg every 8 hours.  Triamcinolone twice daily to help with itching May also use anti-histamines daily, generic zyrtec sent May use benadryl at night time

## 2018-12-12 NOTE — ED Notes (Signed)
Patient able to ambulate independently  

## 2018-12-13 NOTE — ED Provider Notes (Signed)
[facility]    CSN: [id number] Arrival date & time: 12/12/18  1718     History   Chief Complaint Chief Complaint  Patient presents with  . Rash    HPI [name] is a 38 y.o. female no contributing past medical history presenting today for evaluation of pain secondary to shingles.  Patient states that she developed a rash approximately 4 to 5 days ago to her right side, she was seen in the emergency room and diagnosed with shingles.  She was initiated on Valtrex.  Recommended ibuprofen for pain.  She is presenting today as she has had continued significant itching and pain associated with this.  She is not taking anything to relieve her symptoms.  She has been trying to keep area covered to avoid further irritation.  HPI  Past Medical History:  Diagnosis Date  . Abnormal Pap smear   . Chlamydia   . Gonorrhea   . Headache(784.0)   . HPV (human papilloma virus) infection   . Trichomonas   . Urinary tract infection     Patient Active Problem List   Diagnosis Date Noted  . Maternal anemia complicating pregnancy, childbirth, or the puerperium 09/02/2011  . Prolonged latent phase of labor 08/31/2011  . Cesarean delivery, without mention of indication, delivered, with or without mention of antepartum condition 08/31/2011  . Preterm premature rupture of membranes 08/27/2011  . Gonorrhea   . Chlamydia     Past Surgical History:  Procedure Laterality Date  . CESAREAN SECTION  08/31/2011   Procedure: CESAREAN SECTION;  Surgeon: Agnes Lawrence, MD;  Location: [facility];  Service: Gynecology;  Laterality: N/A;  Primary Cesarean section Baby Boy @ 1418  . COLPOSCOPY  2010  . LEEP      OB History    Gravida  3   Para  3   Term  2   Preterm  1   AB      Living  3     SAB      TAB      Ectopic      Multiple      Live Births  3            Home Medications    Prior to Admission medications   Medication Sig Start Date End Date Taking?  Authorizing Provider  benzocaine (ORAJEL) 10 % mucosal gel Use as directed 1 application in the mouth or throat as needed for mouth pain. 08/18/17   Tasia Catchings, Amy V, PA-C  Cetirizine HCl 10 MG CAPS Take 1 capsule (10 mg total) by mouth daily for 10 days. 12/12/18 12/22/18  Mitcheal Sweetin C, PA-C  ibuprofen (ADVIL,MOTRIN) 800 MG tablet Take 1 tablet (800 mg total) by mouth 3 (three) times daily. 12/10/18   Montine Circle, PA-C  triamcinolone cream (KENALOG) 0.1 % Apply 1 application topically 2 (two) times daily. 12/12/18   Erasto Sleight C, PA-C  valACYclovir (VALTREX) 1000 MG tablet Take 1 tablet (1,000 mg total) by mouth 3 (three) times daily for 7 days. 12/10/18 12/17/18  Montine Circle, PA-C    Family History Family History  Problem Relation Age of Onset  . Hypertension Mother   . Diabetes Mother   . Stroke Mother     Social History Social History   Tobacco Use  . Smoking status: Current Every Day Smoker    Packs/day: 0.20    Years: 5.00    Pack years: 1.00    Types: Cigarettes  .  Smokeless tobacco: Never Used  . Tobacco comment: quit with + preg  Substance Use Topics  . Alcohol use: No  . Drug use: No     Allergies   Patient has no known allergies.   Review of Systems Review of Systems  Constitutional: Negative for fatigue and fever.  HENT: Negative for mouth sores.   Eyes: Negative for visual disturbance.  Respiratory: Negative for shortness of breath.   Cardiovascular: Negative for chest pain.  Gastrointestinal: Negative for abdominal pain, nausea and vomiting.  Genitourinary: Negative for genital sores.  Musculoskeletal: Negative for arthralgias and joint swelling.  Skin: Positive for color change and rash. Negative for wound.  Neurological: Negative for dizziness, weakness, light-headedness and headaches.     Physical Exam Triage Vital Signs ED Triage Vitals [12/12/18 1729]  Enc Vitals Group     BP 99/61     Pulse Rate 93     Resp 18     Temp 97.8 F  (36.6 C)     Temp Source Oral     SpO2 97 %     Weight      Height      Head Circumference      Peak Flow      Pain Score 7     Pain Loc      Pain Edu?      Excl. in [place]?    No data found.  Updated Vital Signs BP 99/61 (BP Location: Right Arm)   Pulse 93   Temp 97.8 F (36.6 C) (Oral)   Resp 18   LMP 12/03/2018   SpO2 97%   Visual Acuity Right Eye Distance:   Left Eye Distance:   Bilateral Distance:    Right Eye Near:   Left Eye Near:    Bilateral Near:     Physical Exam Vitals signs and nursing note reviewed.  Constitutional:      Appearance: She is well-developed.     Comments: No acute distress  HENT:     Head: Normocephalic and atraumatic.     Nose: Nose normal.  Eyes:     Conjunctiva/sclera: Conjunctivae normal.  Neck:     Musculoskeletal: Neck supple.  Cardiovascular:     Rate and Rhythm: Normal rate.  Pulmonary:     Effort: Pulmonary effort is normal. No respiratory distress.  Abdominal:     General: There is no distension.  Musculoskeletal: Normal range of motion.  Skin:    General: Skin is warm and dry.     Comments: Dermatomal rash to right lower side, slightly erythematous papular lesions extending from flank to lateral abdomen, no vesicles or crusting  Neurological:     Mental Status: She is alert and oriented to person, place, and time.      UC Treatments / Results  Labs (all labs ordered are listed, but only abnormal results are displayed) Labs Reviewed - No data to display  EKG None  Radiology No results found.  Procedures Procedures (including critical care time)  Medications Ordered in UC Medications - No data to display  Initial Impression / Assessment and Plan / UC Course  I have reviewed the triage vital signs and the nursing notes.  Pertinent labs & imaging results that were available during my care of the patient were reviewed by me and considered in my medical decision making (see chart for details).      Nonclassic shingles rash, does have dermatomal pattern and does not cross midline.  Will have patient  continue Valtrex.  Will provide triamcinolone cream to apply topically to help with itching.  Continue Tylenol and ibuprofen for pain.  Discussed that she may also try antihistamines to help with itching.  Continue to monitor,Discussed strict return precautions. Patient verbalized understanding and is agreeable with plan.  Final Clinical Impressions(s) / UC Diagnoses   Final diagnoses:  Herpes zoster without complication     Discharge Instructions     Continue Valtrex Use anti-inflammatories for pain/swelling. You may take up to 800 mg Ibuprofen every 8 hours with food. You may supplement Ibuprofen with Tylenol [phone] mg every 8 hours.  Triamcinolone twice daily to help with itching May also use anti-histamines daily, generic zyrtec sent May use benadryl at night time    ED Prescriptions    Medication Sig Dispense Auth. Provider   triamcinolone cream (KENALOG) 0.1 % Apply 1 application topically 2 (two) times daily. 30 g Isidora Laham C, PA-C   Cetirizine HCl 10 MG CAPS Take 1 capsule (10 mg total) by mouth daily for 10 days. 10 capsule Kwali Wrinkle C, PA-C     Controlled Substance Prescriptions [place] Controlled Substance Registry consulted? Not Applicable   Janith Lima, [place] 12/13/18 2048

## 2019-03-17 ENCOUNTER — Other Ambulatory Visit: Payer: Self-pay

## 2019-03-17 ENCOUNTER — Ambulatory Visit
Admission: EM | Admit: 2019-03-17 | Discharge: 2019-03-17 | Disposition: A | Discharge status: Home / Self Care | Payer: BLUE CROSS/BLUE SHIELD | Attending: Family Medicine | Admitting: Family Medicine

## 2019-03-17 ENCOUNTER — Encounter: Payer: Self-pay | Admitting: Family Medicine

## 2019-03-17 DIAGNOSIS — K047 Periapical abscess without sinus: Secondary | ICD-10-CM

## 2019-03-17 MED ORDER — HYDROCODONE-ACETAMINOPHEN 5-325 MG PO TABS: 1.0000 | ORAL_TABLET | Freq: Four times a day (QID) | ORAL | 0 refills | Status: DC | PRN

## 2019-03-17 MED ORDER — PENICILLIN V POTASSIUM 500 MG PO TABS: 500.0000 mg | ORAL_TABLET | Freq: Three times a day (TID) | ORAL | 0 refills | Status: DC

## 2019-03-17 NOTE — ED Triage Notes (Signed)
Pt states was at work and developed rt jaw spasms and now having rt upper tooth ache

## 2019-03-17 NOTE — ED Provider Notes (Signed)
[name]    CSN: [id number] Arrival date & time: 03/17/19  1304     History   Chief Complaint Chief Complaint  Patient presents with  . Dental Pain    HPI [name] is a 38 y.o. female.   Two days of dental pain in tooth #3.  H/o Poor dental condition  Works for [employer] and pain started yesterday, radiation to right side of head.  Tried ibuprofen and tylenol.  No fever, cough.     Past Medical History:  Diagnosis Date  . Abnormal Pap smear   . Chlamydia   . Gonorrhea   . Headache(784.0)   . HPV (human papilloma virus) infection   . Trichomonas   . Urinary tract infection     Patient Active Problem List   Diagnosis Date Noted  . Maternal anemia complicating pregnancy, childbirth, or the puerperium 09/02/2011  . Prolonged latent phase of labor 08/31/2011  . Cesarean delivery, without mention of indication, delivered, with or without mention of antepartum condition 08/31/2011  . Preterm premature rupture of membranes 08/27/2011  . Gonorrhea   . Chlamydia     Past Surgical History:  Procedure Laterality Date  . CESAREAN SECTION  08/31/2011   Procedure: CESAREAN SECTION;  Surgeon: Agnes Lawrence, MD;  Location: [facility];  Service: Gynecology;  Laterality: N/A;  Primary Cesarean section Baby Boy @ 1418  . COLPOSCOPY  2010  . LEEP      OB History    Gravida  3   Para  3   Term  2   Preterm  1   AB      Living  3     SAB      TAB      Ectopic      Multiple      Live Births  3            Home Medications    Prior to Admission medications   Medication Sig Start Date End Date Taking? Authorizing Provider  benzocaine (ORAJEL) 10 % mucosal gel Use as directed 1 application in the mouth or throat as needed for mouth pain. 08/18/17   Tasia Catchings, Amy V, PA-C  Cetirizine HCl 10 MG CAPS Take 1 capsule (10 mg total) by mouth daily for 10 days. 12/12/18 12/22/18  Wieters, Hallie C, PA-C  HYDROcodone-acetaminophen (NORCO) 5-325  MG tablet Take 1 tablet by mouth every 6 (six) hours as needed for moderate pain. 03/17/19   Robyn Haber, MD  ibuprofen (ADVIL,MOTRIN) 800 MG tablet Take 1 tablet (800 mg total) by mouth 3 (three) times daily. 12/10/18   Montine Circle, PA-C  penicillin v potassium (VEETID) 500 MG tablet Take 1 tablet (500 mg total) by mouth 3 (three) times daily. 03/17/19   Robyn Haber, MD  triamcinolone cream (KENALOG) 0.1 % Apply 1 application topically 2 (two) times daily. 12/12/18   Wieters, Elesa Hacker, PA-C    Family History Family History  Problem Relation Age of Onset  . Hypertension Mother   . Diabetes Mother   . Stroke Mother     Social History Social History   Tobacco Use  . Smoking status: Current Every Day Smoker    Packs/day: 0.20    Years: 5.00    Pack years: 1.00    Types: Cigarettes  . Smokeless tobacco: Never Used  . Tobacco comment: quit with + preg  Substance Use Topics  . Alcohol use: No  . Drug use: No  Allergies   Patient has no known allergies.   Review of Systems Review of Systems  Constitutional: Negative.   HENT: Positive for dental problem.   Neurological: Positive for headaches.  All other systems reviewed and are negative.    Physical Exam Triage Vital Signs ED Triage Vitals  Enc Vitals Group     BP      Pulse      Resp      Temp      Temp src      SpO2      Weight      Height      Head Circumference      Peak Flow      Pain Score      Pain Loc      Pain Edu?      Excl. in [place]?    No data found.  Updated Vital Signs BP 114/79 (BP Location: Left Arm)   Pulse 93   Temp 98.8 F (37.1 C) (Oral)   Resp 18   SpO2 98%    Physical Exam   UC Treatments / Results  Labs (all labs ordered are listed, but only abnormal results are displayed) Labs Reviewed - No data to display  EKG None  Radiology No results found.  Procedures Procedures (including critical care time)  Medications Ordered in UC Medications - No data to  display  Initial Impression / Assessment and Plan / UC Course  I have reviewed the triage vital signs and the nursing notes.  Pertinent labs & imaging results that were available during my care of the patient were reviewed by me and considered in my medical decision making (see chart for details).    Final Clinical Impressions(s) / UC Diagnoses   Final diagnoses:  Dental abscess   Discharge Instructions   None    ED Prescriptions    Medication Sig Dispense Auth. Provider   penicillin v potassium (VEETID) 500 MG tablet Take 1 tablet (500 mg total) by mouth 3 (three) times daily. 30 tablet Robyn Haber, MD   HYDROcodone-acetaminophen (NORCO) 5-325 MG tablet Take 1 tablet by mouth every 6 (six) hours as needed for moderate pain. 10 tablet Robyn Haber, MD     Controlled Substance Prescriptions [place] Controlled Substance Registry consulted? Not Applicable   Robyn Haber, MD 03/17/19 1322

## 2019-05-18 ENCOUNTER — Other Ambulatory Visit: Payer: Self-pay

## 2019-05-18 DIAGNOSIS — Z20822 Contact with and (suspected) exposure to covid-19: Secondary | ICD-10-CM

## 2019-05-18 DIAGNOSIS — R6889 Other general symptoms and signs: Secondary | ICD-10-CM | POA: Diagnosis not present

## 2019-05-21 LAB — NOVEL CORONAVIRUS, NAA: SARS-CoV-2, NAA: NOT DETECTED

## 2019-05-30 ENCOUNTER — Encounter: Payer: Self-pay | Admitting: Emergency Medicine

## 2019-05-30 ENCOUNTER — Other Ambulatory Visit: Payer: Self-pay

## 2019-05-30 ENCOUNTER — Ambulatory Visit
Admission: EM | Admit: 2019-05-30 | Discharge: 2019-05-30 | Disposition: A | Discharge status: Home / Self Care | Payer: BC Managed Care – PPO | Attending: Physician Assistant | Admitting: Physician Assistant

## 2019-05-30 DIAGNOSIS — K0889 Other specified disorders of teeth and supporting structures: Secondary | ICD-10-CM

## 2019-05-30 MED ORDER — AMOXICILLIN-POT CLAVULANATE 875-125 MG PO TABS: 1.0000 | ORAL_TABLET | Freq: Two times a day (BID) | ORAL | 0 refills | Status: DC

## 2019-05-30 MED ORDER — MELOXICAM 7.5 MG PO TABS: 7.5000 mg | ORAL_TABLET | Freq: Every day | ORAL | 0 refills | Status: DC

## 2019-05-30 MED ORDER — FLUCONAZOLE 150 MG PO TABS: 150.0000 mg | ORAL_TABLET | Freq: Every day | ORAL | 0 refills | Status: DC

## 2019-05-30 NOTE — ED Triage Notes (Signed)
Pt presents to [facility] for assessment of left sided dental pain x 1 week.

## 2019-05-30 NOTE — ED Notes (Signed)
Patient able to ambulate independently  

## 2019-05-30 NOTE — ED Provider Notes (Signed)
[name]    CSN: [id number] Arrival date & time: 05/30/19  1439      History   Chief Complaint Chief Complaint  Patient presents with  . Dental Pain    HPI [name] is a 38 y.o. female.   38 year old female comes in for 1 week history of left upper jaw dental pain.  She has multiple known cracked teeth/cavity.  She was following with a dentist for care until [place] started.  She denies oral swelling, swelling of the throat, tripoding, drooling, trismus.  Denies fever, chills, night sweats.  Denies facial swelling.  She has been taking ibuprofen, Tylenol, Aleve without relief.     Past Medical History:  Diagnosis Date  . Abnormal Pap smear   . Chlamydia   . Gonorrhea   . Headache(784.0)   . HPV (human papilloma virus) infection   . Trichomonas   . Urinary tract infection     Patient Active Problem List   Diagnosis Date Noted  . Maternal anemia complicating pregnancy, childbirth, or the puerperium 09/02/2011  . Prolonged latent phase of labor 08/31/2011  . Cesarean delivery, without mention of indication, delivered, with or without mention of antepartum condition 08/31/2011  . Preterm premature rupture of membranes 08/27/2011  . Gonorrhea   . Chlamydia     Past Surgical History:  Procedure Laterality Date  . CESAREAN SECTION  08/31/2011   Procedure: CESAREAN SECTION;  Surgeon: Agnes Lawrence, MD;  Location: [facility];  Service: Gynecology;  Laterality: N/A;  Primary Cesarean section Baby Boy @ 1418  . COLPOSCOPY  2010  . LEEP      OB History    Gravida  3   Para  3   Term  2   Preterm  1   AB      Living  3     SAB      TAB      Ectopic      Multiple      Live Births  3            Home Medications    Prior to Admission medications   Medication Sig Start Date End Date Taking? Authorizing Provider  amoxicillin-clavulanate (AUGMENTIN) 875-125 MG tablet Take 1 tablet by mouth every 12 (twelve) hours. 05/30/19    Tasia Catchings,  V, PA-C  Cetirizine HCl 10 MG CAPS Take 1 capsule (10 mg total) by mouth daily for 10 days. 12/12/18 12/22/18  Wieters, Hallie C, PA-C  fluconazole (DIFLUCAN) 150 MG tablet Take 1 tablet (150 mg total) by mouth daily. Take second dose 72 hours later if symptoms still persists. 05/30/19   Tasia Catchings,  V, PA-C  ibuprofen (ADVIL,MOTRIN) 800 MG tablet Take 1 tablet (800 mg total) by mouth 3 (three) times daily. 12/10/18   Montine Circle, PA-C  meloxicam (MOBIC) 7.5 MG tablet Take 1 tablet (7.5 mg total) by mouth daily. 05/30/19   Tasia Catchings,  V, PA-C  triamcinolone cream (KENALOG) 0.1 % Apply 1 application topically 2 (two) times daily. 12/12/18   Wieters, Elesa Hacker, PA-C    Family History Family History  Problem Relation Age of Onset  . Hypertension Mother   . Diabetes Mother   . Stroke Mother     Social History Social History   Tobacco Use  . Smoking status: Current Every Day Smoker    Packs/day: 0.20    Years: 5.00    Pack years: 1.00    Types: Cigarettes  . Smokeless tobacco: Never Used  .  Tobacco comment: quit with + preg  Substance Use Topics  . Alcohol use: No  . Drug use: No     Allergies   Patient has no known allergies.   Review of Systems Review of Systems  Reason unable to perform ROS: See HPI as above.     Physical Exam Triage Vital Signs ED Triage Vitals [05/30/19 1447]  Enc Vitals Group     BP 104/70     Pulse Rate 86     Resp 16     Temp 98.2 F (36.8 C)     Temp Source Oral     SpO2 97 %     Weight      Height      Head Circumference      Peak Flow      Pain Score 8     Pain Loc      Pain Edu?      Excl. in [place]?    No data found.  Updated Vital Signs BP 104/70 (BP Location: Left Arm)   Pulse 86   Temp 98.2 F (36.8 C) (Oral)   Resp 16   LMP 05/30/2019   SpO2 97%   Physical Exam Constitutional:      General: She is not in acute distress.    Appearance: She is well-developed. She is not ill-appearing, toxic-appearing or diaphoretic.   HENT:     Head: Normocephalic and atraumatic.     Jaw: No trismus.     Mouth/Throat:     Mouth: Mucous membranes are moist.     Pharynx: Oropharynx is clear. Uvula midline. No uvula swelling.     Tonsils: No tonsillar exudate.     Comments: Poor dentition. Multiple cracked tooth/cavities. Tenderness to palpation lf left upper gumline. No fluctuance felt.   Floor of mouth soft to palpation. No facial swelling. No tenderness to palpation around the maxillary sinus.  Neck:     Musculoskeletal: Normal range of motion and neck supple.  Skin:    General: Skin is warm and dry.  Neurological:     Mental Status: She is alert and oriented to person, place, and time.      UC Treatments / Results  Labs (all labs ordered are listed, but only abnormal results are displayed) Labs Reviewed - No data to display  EKG   Radiology No results found.  Procedures Procedures (including critical care time)  Medications Ordered in UC Medications - No data to display  Initial Impression / Assessment and Plan / UC Course  I have reviewed the triage vital signs and the nursing notes.  Pertinent labs & imaging results that were available during my care of the patient were reviewed by me and considered in my medical decision making (see chart for details).    Start antibiotics for possible dental infection. Symptomatic treatment as needed. Discussed with patient symptoms can return if dental problem is not addressed. Follow up with dentist for further evaluation and treatment of dental pain. Resources given. Return precautions given.   Patient states had Mobic in the past with good relief of pain.  Would like to start with Mobic at this time.  Patient okay to have short course of Norco refill if symptoms not improving with Mobic and Augmentin.  Final Clinical Impressions(s) / UC Diagnoses   Final diagnoses:  Pain, dental    ED Prescriptions    Medication Sig Dispense Auth. Provider    amoxicillin-clavulanate (AUGMENTIN) 875-125 MG tablet Take 1 tablet by  mouth every 12 (twelve) hours. 14 tablet ,  V, PA-C   meloxicam (MOBIC) 7.5 MG tablet Take 1 tablet (7.5 mg total) by mouth daily. 10 tablet ,  V, PA-C   fluconazole (DIFLUCAN) 150 MG tablet Take 1 tablet (150 mg total) by mouth daily. Take second dose 72 hours later if symptoms still persists. 2 tablet Tobin Chad, PA-C 05/30/19 1503

## 2019-05-30 NOTE — Discharge Instructions (Signed)
Start Augmentin as directed for dental infection. for pain. Follow up with dentist for further treatment and evaluation. If experiencing swelling of the throat, trouble breathing, trouble swallowing, leaning forward to breath, drooling, go to the emergency department for further evaluation.

## 2019-10-04 ENCOUNTER — Other Ambulatory Visit: Payer: Self-pay

## 2019-10-04 DIAGNOSIS — Z20822 Contact with and (suspected) exposure to covid-19: Secondary | ICD-10-CM

## 2019-10-06 LAB — NOVEL CORONAVIRUS, NAA: SARS-CoV-2, NAA: NOT DETECTED

## 2019-11-01 ENCOUNTER — Encounter: Payer: Self-pay | Admitting: Emergency Medicine

## 2019-11-01 ENCOUNTER — Ambulatory Visit
Admission: EM | Admit: 2019-11-01 | Discharge: 2019-11-01 | Disposition: A | Discharge status: Home / Self Care | Payer: BC Managed Care – PPO | Attending: Emergency Medicine | Admitting: Emergency Medicine

## 2019-11-01 ENCOUNTER — Other Ambulatory Visit: Payer: Self-pay

## 2019-11-01 DIAGNOSIS — Z3202 Encounter for pregnancy test, result negative: Secondary | ICD-10-CM

## 2019-11-01 DIAGNOSIS — Z7251 High risk heterosexual behavior: Secondary | ICD-10-CM | POA: Insufficient documentation

## 2019-11-01 DIAGNOSIS — R1030 Lower abdominal pain, unspecified: Secondary | ICD-10-CM

## 2019-11-01 LAB — POCT URINALYSIS DIP (MANUAL ENTRY)
Bilirubin, UA: NEGATIVE
Glucose, UA: NEGATIVE mg/dL
Ketones, POC UA: NEGATIVE mg/dL
Leukocytes, UA: NEGATIVE
Nitrite, UA: NEGATIVE
Protein Ur, POC: NEGATIVE mg/dL
Spec Grav, UA: >=1.03 — AB (ref 1.010–1.025)
Urobilinogen, UA: 2 E.U./dL — AB
pH, UA: 6 (ref 5.0–8.0)

## 2019-11-01 LAB — POCT URINE PREGNANCY: Preg Test, Ur: NEGATIVE

## 2019-11-01 MED ORDER — MELOXICAM 7.5 MG PO TABS: 7.5000 mg | ORAL_TABLET | Freq: Every day | ORAL | 0 refills | Status: DC

## 2019-11-01 NOTE — ED Provider Notes (Signed)
[name]    CSN: [id number] Arrival date & time: 11/01/19  1342      History   Chief Complaint Chief Complaint  Patient presents with  . Abdominal Pain    HPI [name] is a 39 y.o. female presenting for 2-week course of urinary frequency, urgency, lower abdominal pain.  Patient notes right-sided back pain as well.  Denies fever, pelvic pain, vaginal discharge, anogenital lesions.  Has not tried thing for symptoms.   Past Medical History:  Diagnosis Date  . Abnormal Pap smear   . Chlamydia   . Gonorrhea   . Headache(784.0)   . HPV (human papilloma virus) infection   . Trichomonas   . Urinary tract infection     Patient Active Problem List   Diagnosis Date Noted  . Maternal anemia complicating pregnancy, childbirth, or the puerperium 09/02/2011  . Prolonged latent phase of labor 08/31/2011  . Cesarean delivery, without mention of indication, delivered, with or without mention of antepartum condition 08/31/2011  . Preterm premature rupture of membranes 08/27/2011  . Gonorrhea   . Chlamydia     Past Surgical History:  Procedure Laterality Date  . CESAREAN SECTION  08/31/2011   Procedure: CESAREAN SECTION;  Surgeon: Agnes Lawrence, MD;  Location: [facility];  Service: Gynecology;  Laterality: N/A;  Primary Cesarean section Baby Boy @ 1418  . COLPOSCOPY  2010  . LEEP      OB History    Gravida  3   Para  3   Term  2   Preterm  1   AB      Living  3     SAB      TAB      Ectopic      Multiple      Live Births  3            Home Medications    Prior to Admission medications   Medication Sig Start Date End Date Taking? Authorizing Provider  Cetirizine HCl 10 MG CAPS Take 1 capsule (10 mg total) by mouth daily for 10 days. 12/12/18 12/22/18  Wieters, Hallie C, PA-C  ibuprofen (ADVIL,MOTRIN) 800 MG tablet Take 1 tablet (800 mg total) by mouth 3 (three) times daily. 12/10/18   Montine Circle, PA-C  meloxicam (MOBIC) 7.5  MG tablet Take 1 tablet (7.5 mg total) by mouth daily. 11/01/19   Hall-Potvin, [place], PA-C    Family History Family History  Problem Relation Age of Onset  . Hypertension Mother   . Diabetes Mother   . Stroke Mother     Social History Social History   Tobacco Use  . Smoking status: Current Every Day Smoker    Packs/day: 0.20    Years: 5.00    Pack years: 1.00    Types: Cigarettes  . Smokeless tobacco: Never Used  . Tobacco comment: quit with + preg  Substance Use Topics  . Alcohol use: No  . Drug use: No     Allergies   Patient has no known allergies.   Review of Systems Review of Systems  Constitutional: Negative for activity change, appetite change, fatigue and fever.  Respiratory: Negative for cough and shortness of breath.   Cardiovascular: Negative for chest pain and palpitations.  Gastrointestinal: Positive for abdominal pain. Negative for abdominal distention, blood in stool, constipation, diarrhea, nausea and vomiting.  Endocrine: Negative for polydipsia, polyphagia and polyuria.  Genitourinary: Positive for dysuria and frequency. Negative for flank pain, hematuria, pelvic  pain, urgency, vaginal bleeding, vaginal discharge and vaginal pain.     Physical Exam Triage Vital Signs ED Triage Vitals  Enc Vitals Group     BP      Pulse      Resp      Temp      Temp src      SpO2      Weight      Height      Head Circumference      Peak Flow      Pain Score      Pain Loc      Pain Edu?      Excl. in [place]?    No data found.  Updated Vital Signs BP 109/74 (BP Location: Left Arm)   Pulse 70   Temp 97.8 F (36.6 C) (Temporal)   Resp 16   LMP 09/26/2019   SpO2 98%   Visual Acuity Right Eye Distance:   Left Eye Distance:   Bilateral Distance:    Right Eye Near:   Left Eye Near:    Bilateral Near:     Physical Exam Constitutional:      General: She is not in acute distress.    Appearance: She is well-developed. She is not ill-appearing.    HENT:     Head: Normocephalic and atraumatic.  Eyes:     General: No scleral icterus.    Pupils: Pupils are equal, round, and reactive to light.  Cardiovascular:     Rate and Rhythm: Normal rate and regular rhythm.  Pulmonary:     Effort: Pulmonary effort is normal. No respiratory distress.     Breath sounds: No wheezing.  Abdominal:     General: Bowel sounds are normal.     Palpations: Abdomen is soft.     Tenderness: There is no abdominal tenderness. There is no right CVA tenderness, left CVA tenderness, guarding or rebound. Negative signs include Murphy's sign, Rovsing's sign and McBurney's sign.  Genitourinary:    Comments: Patient declined, self-swab performed Skin:    Coloration: Skin is not jaundiced or pale.  Neurological:     Mental Status: She is alert and oriented to person, place, and time.      UC Treatments / Results  Labs (all labs ordered are listed, but only abnormal results are displayed) Labs Reviewed  POCT URINALYSIS DIP (MANUAL ENTRY) - Abnormal; Notable for the following components:      Result Value   Spec Grav, UA >=1.030 (*)    Blood, UA trace-lysed (*)    Urobilinogen, UA 2.0 (*)    All other components within normal limits  POCT URINE PREGNANCY - Normal  CERVICOVAGINAL ANCILLARY ONLY    EKG   Radiology No results found.  Procedures Procedures (including critical care time)  Medications Ordered in UC Medications - No data to display  Initial Impression / Assessment and Plan / UC Course  I have reviewed the triage vital signs and the nursing notes.  Pertinent labs & imaging results that were available during my care of the patient were reviewed by me and considered in my medical decision making (see chart for details).     POC urine dipstick done in office, reviewed by me: Showing trace lysed blood, urobilinogen.  Culture deferred.  Pregnancy negative.  Will trial Mobic for lower abdominal pain cytology is pending. Final Clinical  Impressions(s) / UC Diagnoses   Final diagnoses:  Lower abdominal pain  Unprotected sex     Discharge  Instructions     Testing for chlamydia, gonorrhea, trichomonas is pending: please look for these results on the MyChart app/website.  We will notify you if you are positive and outline treatment at that time.  Important to avoid all forms of sexual intercourse (oral, vaginal, anal) with any/all partners for the next 7 days to avoid spreading/reinfecting. Any/all sexual partners should be notified of testing/treatment today.  Return for persistent/worsening symptoms or if you develop fever, abdominal or pelvic pain, blood in your urine, or are re-exposed to an STI.    ED Prescriptions    Medication Sig Dispense Auth. Provider   meloxicam (MOBIC) 7.5 MG tablet Take 1 tablet (7.5 mg total) by mouth daily. 10 tablet Hall-Potvin, [place], PA-C     PDMP not reviewed this encounter.   Hall-Potvin, [place], [place] 11/02/19 1352

## 2019-11-01 NOTE — ED Triage Notes (Signed)
Pt presents to [facility] for assessment of lower abdominal pain with urinary frequency and urgency x 2 weeks.  C/o right flank pain occasionally as well.

## 2019-11-01 NOTE — Discharge Instructions (Signed)

## 2019-11-03 LAB — CERVICOVAGINAL ANCILLARY ONLY
Bacterial vaginitis: NEGATIVE
Chlamydia: NEGATIVE
Neisseria Gonorrhea: NEGATIVE
Trichomonas: NEGATIVE

## 2019-12-21 ENCOUNTER — Ambulatory Visit
Admission: EM | Admit: 2019-12-21 | Discharge: 2019-12-21 | Disposition: A | Discharge status: Home / Self Care | Payer: Self-pay | Attending: Physician Assistant | Admitting: Physician Assistant

## 2019-12-21 DIAGNOSIS — R102 Pelvic and perineal pain: Secondary | ICD-10-CM | POA: Insufficient documentation

## 2019-12-21 DIAGNOSIS — R1032 Left lower quadrant pain: Secondary | ICD-10-CM | POA: Insufficient documentation

## 2019-12-21 LAB — POCT URINALYSIS DIP (MANUAL ENTRY)
Bilirubin, UA: NEGATIVE
Glucose, UA: NEGATIVE mg/dL
Ketones, POC UA: NEGATIVE mg/dL
Nitrite, UA: NEGATIVE
Protein Ur, POC: NEGATIVE mg/dL
Spec Grav, UA: 1.015 (ref 1.010–1.025)
Urobilinogen, UA: 1 E.U./dL
pH, UA: 6.5 (ref 5.0–8.0)

## 2019-12-21 LAB — POCT URINE PREGNANCY: Preg Test, Ur: NEGATIVE

## 2019-12-21 MED ORDER — METRONIDAZOLE 0.75 % VA GEL: 1.0000 | Freq: Every day | VAGINAL | 0 refills | Status: AC

## 2019-12-21 MED ORDER — POLYETHYLENE GLYCOL 3350 17 G PO PACK: 17.0000 g | PACK | Freq: Every day | ORAL | 0 refills | Status: DC

## 2019-12-21 NOTE — ED Triage Notes (Signed)
Pt c/o lower abdominal pain x1wk what has worsen over time. States today having a vaginal odor. Denies vaginal discharge. States seen here a month ago for same. States can't see PCP until 01/01/20. Denies n/v/d.

## 2019-12-21 NOTE — ED Provider Notes (Signed)
[name]    CSN: [id number] Arrival date & time: 12/21/19  1700      History   Chief Complaint Chief Complaint  Patient presents with  . Abdominal Pain    HPI [name] is a 39 y.o. female.   39 year old female comes in for 1 week history of lower abdominal pain that has gradually worsened. Noticed vaginal odor today. Denies nausea, vomiting, diarrhea. Suprapubic pain, intermittent, cramping/sharp pain, worse with movement and improved with resting. Denies fever, chills, body aches. Denies urinary symptoms such as frequency, dysuria, hematuria. Denies flank pain. Denies vaginal bleeding/spotting. Last BM few hours prior to arrival with straining with improvement of symptoms. Prior to this BM, had last BM about 1 week ago. LMP 12/10/2019. In monogamous relationship.      Past Medical History:  Diagnosis Date  . Abnormal Pap smear   . Chlamydia   . Gonorrhea   . Headache(784.0)   . HPV (human papilloma virus) infection   . Trichomonas   . Urinary tract infection     Patient Active Problem List   Diagnosis Date Noted  . Maternal anemia complicating pregnancy, childbirth, or the puerperium 09/02/2011  . Prolonged latent phase of labor 08/31/2011  . Cesarean delivery, without mention of indication, delivered, with or without mention of antepartum condition 08/31/2011  . Preterm premature rupture of membranes 08/27/2011  . Gonorrhea   . Chlamydia     Past Surgical History:  Procedure Laterality Date  . CESAREAN SECTION  08/31/2011   Procedure: CESAREAN SECTION;  Surgeon: Agnes Lawrence, MD;  Location: [facility];  Service: Gynecology;  Laterality: N/A;  Primary Cesarean section Baby Boy @ 1418  . COLPOSCOPY  2010  . LEEP      OB History    Gravida  3   Para  3   Term  2   Preterm  1   AB      Living  3     SAB      TAB      Ectopic      Multiple      Live Births  3            Home Medications    Prior to Admission  medications   Medication Sig Start Date End Date Taking? Authorizing Provider  Cetirizine HCl 10 MG CAPS Take 1 capsule (10 mg total) by mouth daily for 10 days. 12/12/18 12/22/18  Wieters, Hallie C, PA-C  ibuprofen (ADVIL,MOTRIN) 800 MG tablet Take 1 tablet (800 mg total) by mouth 3 (three) times daily. 12/10/18   Montine Circle, PA-C  meloxicam (MOBIC) 7.5 MG tablet Take 1 tablet (7.5 mg total) by mouth daily. 11/01/19   Hall-Potvin, [place], PA-C  metroNIDAZOLE (METROGEL VAGINAL) 0.75 % vaginal gel Place 1 Applicatorful vaginally at bedtime for 5 days. 12/21/19 12/26/19  Tasia Catchings, Carmin Alvidrez V, PA-C  polyethylene glycol (MIRALAX) 17 g packet Take 17 g by mouth daily. 12/21/19   Ok Edwards, PA-C    Family History Family History  Problem Relation Age of Onset  . Hypertension Mother   . Diabetes Mother   . Stroke Mother     Social History Social History   Tobacco Use  . Smoking status: Current Every Day Smoker    Packs/day: 0.20    Years: 5.00    Pack years: 1.00    Types: Cigarettes  . Smokeless tobacco: Never Used  . Tobacco comment: quit with + preg  Substance  Use Topics  . Alcohol use: No  . Drug use: No     Allergies   Patient has no known allergies.   Review of Systems Review of Systems  Reason unable to perform ROS: See HPI as above.     Physical Exam Triage Vital Signs ED Triage Vitals  Enc Vitals Group     BP 12/21/19 1710 99/73     Pulse Rate 12/21/19 1710 83     Resp 12/21/19 1710 18     Temp 12/21/19 1710 98.1 F (36.7 C)     Temp Source 12/21/19 1710 Oral     SpO2 12/21/19 1710 98 %     Weight --      Height --      Head Circumference --      Peak Flow --      Pain Score 12/21/19 1711 0     Pain Loc --      Pain Edu? --      Excl. in [place]? --    No data found.  Updated Vital Signs BP 99/73 (BP Location: Left Arm)   Pulse 83   Temp 98.1 F (36.7 C) (Oral)   Resp 18   LMP 12/10/2019   SpO2 98%   Physical Exam Constitutional:      General: She is not  in acute distress.    Appearance: She is well-developed. She is not ill-appearing, toxic-appearing or diaphoretic.  HENT:     Head: Normocephalic and atraumatic.  Eyes:     Conjunctiva/sclera: Conjunctivae normal.     Pupils: Pupils are equal, round, and reactive to light.  Cardiovascular:     Rate and Rhythm: Normal rate and regular rhythm.  Pulmonary:     Effort: Pulmonary effort is normal. No respiratory distress.     Comments: LCTAB Abdominal:     General: Bowel sounds are normal.     Palpations: Abdomen is soft.     Tenderness: There is abdominal tenderness in the suprapubic area, left upper quadrant and left lower quadrant. There is no right CVA tenderness, left CVA tenderness, guarding or rebound.  Musculoskeletal:     Cervical back: Normal range of motion and neck supple.  Skin:    General: Skin is warm and dry.  Neurological:     Mental Status: She is alert and oriented to person, place, and time.  Psychiatric:        Behavior: Behavior normal.        Judgment: Judgment normal.      UC Treatments / Results  Labs (all labs ordered are listed, but only abnormal results are displayed) Labs Reviewed  POCT URINALYSIS DIP (MANUAL ENTRY) - Abnormal; Notable for the following components:      Result Value   Blood, UA small (*)    Leukocytes, UA Trace (*)    All other components within normal limits  POCT URINE PREGNANCY - Normal  URINE CULTURE  CERVICOVAGINAL ANCILLARY ONLY    EKG   Radiology No results found.  Procedures Procedures (including critical care time)  Medications Ordered in UC Medications - No data to display  Initial Impression / Assessment and Plan / UC Course  I have reviewed the triage vital signs and the nursing notes.  Pertinent labs & imaging results that were available during my care of the patient were reviewed by me and considered in my medical decision making (see chart for details).    Urine with trace leuks and blood. Will send  for urine culture at this time prior to treatment. Discussed possible constipation causing symptoms. Will have patient start miralax and push fluids. Cytology sent. Return precautions given. Patient expresses understanding and agrees to plan.  Patient with trace blood last month when here for similar symptoms. She has PCP appointment in 1-2 weeks, will have patient recheck to ensure resolution of hematuria.   Final Clinical Impressions(s) / UC Diagnoses   Final diagnoses:  Suprapubic pain  LLQ pain   ED Prescriptions    Medication Sig Dispense Auth. Provider   polyethylene glycol (MIRALAX) 17 g packet Take 17 g by mouth daily. 24 each Celsa Nordahl V, PA-C   metroNIDAZOLE (METROGEL VAGINAL) 0.75 % vaginal gel Place 1 Applicatorful vaginally at bedtime for 5 days. 50 g Ok Edwards, PA-C     PDMP not reviewed this encounter.   Ok Edwards, PA-C 12/21/19 1743

## 2019-12-21 NOTE — Discharge Instructions (Signed)
Start miralax as directed. Keep hydrated, urine should be clear to pale yellow in color. Metrogel if insurance covers for BV. Otherwise, we will wait for testing results. Cytology sent, you will be contacted with any positive results that requires further treatment. Refrain from sexual activity and alcohol use for the next 7 days. Monitor for any worsening of symptoms, fever, severe abdominal pain, nausea, vomiting go to the emergency department for further evaluation.

## 2019-12-23 LAB — URINE CULTURE: Culture: NO GROWTH

## 2019-12-26 LAB — CERVICOVAGINAL ANCILLARY ONLY
Bacterial vaginitis: POSITIVE — AB
Candida vaginitis: NEGATIVE
Chlamydia: NEGATIVE
Neisseria Gonorrhea: NEGATIVE
Trichomonas: NEGATIVE

## 2019-12-29 ENCOUNTER — Telehealth: Payer: Self-pay

## 2019-12-29 NOTE — Telephone Encounter (Signed)
Called patient to do their pre-visit COVID screening.  Have you tested positive for COVID or are you currently waiting for COVID test results? no  Have you recently traveled internationally([place], [place], [place], [place], [place]) or within the [place] to a hotspot area([place], [place], [place], [place], [place])? no  Are you currently experiencing any of the following symptoms: fever, cough, SHOB, fatigue, body aches, loss of smell/taste, rash, diarrhea, vomiting, severe headaches, weakness, sore throat? no  Have you been in contact with anyone who has recently travelled? no  Have you been in contact with anyone who is experiencing any of the above symptoms or been diagnosed with [place]  or works in or has recently visited a SNF? Yes, daughter had Covid but they are both out of their quarantine window.

## 2020-01-01 ENCOUNTER — Other Ambulatory Visit: Payer: Self-pay

## 2020-01-01 ENCOUNTER — Encounter: Payer: Self-pay | Admitting: Internal Medicine

## 2020-01-01 ENCOUNTER — Ambulatory Visit (INDEPENDENT_AMBULATORY_CARE_PROVIDER_SITE_OTHER): Payer: BC Managed Care – PPO | Admitting: Internal Medicine

## 2020-01-01 VITALS — BP 106/69 | HR 84 | Temp 98.1°F | Resp 17 | Ht 67.0 in | Wt 158.0 lb

## 2020-01-01 DIAGNOSIS — Z114 Encounter for screening for human immunodeficiency virus [HIV]: Secondary | ICD-10-CM

## 2020-01-01 DIAGNOSIS — Z7689 Persons encountering health services in other specified circumstances: Secondary | ICD-10-CM

## 2020-01-01 DIAGNOSIS — R1030 Lower abdominal pain, unspecified: Secondary | ICD-10-CM | POA: Diagnosis not present

## 2020-01-01 DIAGNOSIS — R319 Hematuria, unspecified: Secondary | ICD-10-CM

## 2020-01-01 NOTE — Progress Notes (Signed)
  Subjective:    [name] - 39 y.o. female MRN [id number]  Date of birth: Aug 30, 1981  HPI  [name] is to establish care. Patient has a PMH significant for abnormal PAP with HPV and h/o trichomonas.   Patient went to urgent care on 2/25 for abdominal pain. Patient had urine sample with trace leukocytes and blood. Urine culture was negative. Was treated with Miralax as well for constipation. Had presented with similar symptoms on 1/6 and found to have trace blood in urine as well. Cultures for STDs were negative at both visits.   Today, she reports that she is feeling better. She has been using Miralax and it has helped with her abdominal pain. Never seen any gross blood in her urine. Regular menstrual periods. Was never menstruating during the urine samples and is not today. No flank pain or history of kidney stones.   ROS per HPI     Health Maintenance:  Health Maintenance Due  Topic Date Due  . HIV Screening  06/10/1996  . PAP SMEAR-Modifier  12/18/2013     Past Medical History: There are no problems to display for this patient.     Social History   reports that she has been smoking cigarettes. She has a 1.00 pack-year smoking history. She has never used smokeless tobacco. She reports that she does not drink alcohol or use drugs.   Family History  family history includes Diabetes in her mother; Hypertension in her mother; Stroke in her mother.   Medications: reviewed and updated   Objective:   Physical Exam BP 106/69   Pulse 84   Temp 98.1 F (36.7 C) (Temporal)   Resp 17   Ht 5\' 7"  (1.702 m)   Wt 158 lb (71.7 kg)   LMP 12/10/2019   SpO2 98%   BMI 24.75 kg/m  Physical Exam  Constitutional: She is oriented to person, place, and time and well-developed, well-nourished, and in no distress. No distress.  Cardiovascular: Normal rate.  Pulmonary/Chest: Effort normal. No respiratory distress.  Musculoskeletal:        General: Normal range of motion.   Neurological: She is alert and oriented to person, place, and time.  Skin: Skin is warm and dry. She is not diaphoretic.  Psychiatric: Affect and judgment normal.        Assessment & Plan:   1. Encounter to establish care Return for annual exam with PAP.   2. Hematuria, unspecified type No UTI symptoms and recent negative urine culture. Not currently menstruating and was not when previous samples were collected. If blood present on sample, would require urology referral. Discussed this with patient. No symptoms of kidney stones or prior history. She has been smoking.  - Urinalysis, Complete  3. Encounter for screening for HIV - HIV antibody (with reflex)  4. Lower abdominal pain Resolved since starting Miralax.      Phill Myron, D.O. 01/01/2020, 1:53 PM Primary Care at [facility]

## 2020-01-01 NOTE — Patient Instructions (Signed)
Thank you for choosing Primary Care at [facility] to be your medical home!    Silas Sacramento was seen by Melina Schools, DO today.   Maple Mirza Tovey's primary care provider is Phill Myron, DO.   For the best care possible, you should try to see Phill Myron, DO whenever you come to the clinic.   We look forward to seeing you again soon!  If you have any questions about your visit today, please call us at [phone] or feel free to reach your primary care provider via [facility] [place].

## 2020-01-02 ENCOUNTER — Other Ambulatory Visit: Payer: Self-pay | Admitting: Internal Medicine

## 2020-01-02 DIAGNOSIS — R3129 Other microscopic hematuria: Secondary | ICD-10-CM

## 2020-01-02 LAB — URINALYSIS, COMPLETE
Bilirubin, UA: NEGATIVE
Glucose, UA: NEGATIVE
Ketones, UA: NEGATIVE
Leukocytes,UA: NEGATIVE
Nitrite, UA: NEGATIVE
Protein,UA: NEGATIVE
RBC, UA: NEGATIVE
Specific Gravity, UA: 1.018 (ref 1.005–1.030)
Urobilinogen, Ur: 1 mg/dL (ref 0.2–1.0)
pH, UA: 6 (ref 5.0–7.5)

## 2020-01-02 LAB — MICROSCOPIC EXAMINATION
Bacteria, UA: NONE SEEN
Casts: NONE SEEN /lpf
WBC, UA: NONE SEEN /hpf (ref 0–5)

## 2020-01-02 LAB — HIV ANTIBODY (ROUTINE TESTING W REFLEX): HIV Screen 4th Generation wRfx: NONREACTIVE

## 2020-01-04 ENCOUNTER — Encounter: Payer: Self-pay | Admitting: Emergency Medicine

## 2020-01-04 ENCOUNTER — Other Ambulatory Visit: Payer: Self-pay

## 2020-01-04 ENCOUNTER — Ambulatory Visit
Admission: EM | Admit: 2020-01-04 | Discharge: 2020-01-04 | Disposition: A | Discharge status: Home / Self Care | Payer: Self-pay | Attending: Internal Medicine | Admitting: Internal Medicine

## 2020-01-04 DIAGNOSIS — N6320 Unspecified lump in the left breast, unspecified quadrant: Secondary | ICD-10-CM

## 2020-01-04 NOTE — ED Notes (Signed)
Patient able to ambulate independently  

## 2020-01-04 NOTE — ED Provider Notes (Signed)
[name]    CSN: [id number] Arrival date & time: 01/04/20  0920      History   Chief Complaint Chief Complaint  Patient presents with  . Breast Mass    HPI [name] is a 39 y.o. female no significant past medical history presenting today for evaluation of mass to left breast.  Patient states that last night she noticed a lump within her left breast.  She did not feel a similar area on the right side.  Has never noticed this previously.  Feels she typically does self breast exams approximately once a month.  She denies any overlying skin changes or rashes, denies significant pain associated with it although is a little uncomfortable with touch.  Denies any nipple discharge.  Denies breast-feeding.  Denies any known family history of breast cancer.  Denies prior breast issues.  Last menstrual cycle around 2/14.  Denies significant changes in breast tissue around menstrual cycle.  HPI  Past Medical History:  Diagnosis Date  . Abnormal Pap smear   . Chlamydia   . Gonorrhea   . Headache(784.0)   . HPV (human papilloma virus) infection   . Trichomonas   . Urinary tract infection     There are no problems to display for this patient.   Past Surgical History:  Procedure Laterality Date  . CESAREAN SECTION  08/31/2011   Procedure: CESAREAN SECTION;  Surgeon: Agnes Lawrence, MD;  Location: [facility];  Service: Gynecology;  Laterality: N/A;  Primary Cesarean section Baby Boy @ 1418  . COLPOSCOPY  2010  . LEEP      OB History    Gravida  3   Para  3   Term  2   Preterm  1   AB      Living  3     SAB      TAB      Ectopic      Multiple      Live Births  3            Home Medications    Prior to Admission medications   Not on File    Family History Family History  Problem Relation Age of Onset  . Hypertension Mother   . Diabetes Mother   . Stroke Mother     Social History Social History   Tobacco Use  . Smoking  status: Current Every Day Smoker    Packs/day: 0.15    Years: 5.00    Pack years: 0.75    Types: Cigarettes  . Smokeless tobacco: Never Used  . Tobacco comment: quit with + preg  Substance Use Topics  . Alcohol use: No  . Drug use: No     Allergies   Patient has no known allergies.   Review of Systems Review of Systems  Constitutional: Negative for fatigue and fever.  Eyes: Negative for visual disturbance.  Respiratory: Negative for shortness of breath.   Cardiovascular: Negative for chest pain.  Gastrointestinal: Negative for abdominal pain, nausea and vomiting.  Musculoskeletal: Negative for arthralgias and joint swelling.  Skin: Negative for color change, rash and wound.  Neurological: Negative for dizziness, weakness, light-headedness and headaches.     Physical Exam Triage Vital Signs ED Triage Vitals  Enc Vitals Group     BP 01/04/20 0931 105/71     Pulse Rate 01/04/20 0931 88     Resp 01/04/20 0931 16     Temp 01/04/20 0931 98.9 F (37.2  C)     Temp Source 01/04/20 0931 Oral     SpO2 01/04/20 0931 98 %     Weight --      Height --      Head Circumference --      Peak Flow --      Pain Score 01/04/20 0932 0     Pain Loc --      Pain Edu? --      Excl. in [place]? --    No data found.  Updated Vital Signs BP 105/71 (BP Location: Left Arm)   Pulse 88   Temp 98.9 F (37.2 C) (Oral)   Resp 16   LMP 12/10/2019   SpO2 98%   Visual Acuity Right Eye Distance:   Left Eye Distance:   Bilateral Distance:    Right Eye Near:   Left Eye Near:    Bilateral Near:     Physical Exam Vitals and nursing note reviewed.  Constitutional:      Appearance: She is well-developed.     Comments: No acute distress  HENT:     Head: Normocephalic and atraumatic.     Nose: Nose normal.  Eyes:     Conjunctiva/sclera: Conjunctivae normal.  Cardiovascular:     Rate and Rhythm: Normal rate.  Pulmonary:     Effort: Pulmonary effort is normal. No respiratory distress.    Chest:       Comments: No obvious discoloration, erythema or warmth noted to breast, firm palpable mass noted to lower outer aspect of areola, no similar firmness palpated on right side; mild tenderness to palpation Abdominal:     General: There is no distension.  Musculoskeletal:        General: Normal range of motion.     Cervical back: Neck supple.  Skin:    General: Skin is warm and dry.  Neurological:     Mental Status: She is alert and oriented to person, place, and time.      UC Treatments / Results  Labs (all labs ordered are listed, but only abnormal results are displayed) Labs Reviewed - No data to display  EKG   Radiology No results found.  Procedures Procedures (including critical care time)  Medications Ordered in UC Medications - No data to display  Initial Impression / Assessment and Plan / UC Course  I have reviewed the triage vital signs and the nursing notes.  Pertinent labs & imaging results that were available during my care of the patient were reviewed by me and considered in my medical decision making (see chart for details).     Patient with palpable area of firmness and irregularity compared to right, will send referral to breast center for diagnostic imaging.  No sign of infection/abscess at this time.  Did recommend warm compresses and Tylenol and ibuprofen as needed for pain/swelling.  Discussed strict return precautions. Patient verbalized understanding and is agreeable with plan.  Final Clinical Impressions(s) / UC Diagnoses   Final diagnoses:  Left breast mass     Discharge Instructions     Your referral will be faxed over-the breast center should call you with appointment date and time If you do not hear from them by the end of next week please call them  In the meantime May apply warm compresses to breast, Tylenol and ibuprofen as needed for any discomfort Follow-up if developing any signs of infection-increased redness  swelling pain or warmth to breast   ED Prescriptions    None  PDMP not reviewed this encounter.   Janith Lima, PA-C 01/04/20 1042

## 2020-01-04 NOTE — Progress Notes (Signed)
Patient notified of results & recommendations. Expressed understanding. Is in the process of getting scheduled with [facility].

## 2020-01-04 NOTE — ED Triage Notes (Signed)
Pt presents to [facility] for assessment of lump to left breast, noted for the first time last night.  No pain associated with it.

## 2020-01-04 NOTE — Discharge Instructions (Signed)
Your referral will be faxed over-the breast center should call you with appointment date and time If you do not hear from them by the end of next week please call them  In the meantime May apply warm compresses to breast, Tylenol and ibuprofen as needed for any discomfort Follow-up if developing any signs of infection-increased redness swelling pain or warmth to breast

## 2020-01-05 ENCOUNTER — Other Ambulatory Visit: Payer: Self-pay | Admitting: Emergency Medicine

## 2020-01-05 DIAGNOSIS — N63 Unspecified lump in unspecified breast: Secondary | ICD-10-CM

## 2020-01-16 ENCOUNTER — Ambulatory Visit: Payer: Self-pay

## 2020-01-16 ENCOUNTER — Ambulatory Visit
Admission: RE | Admit: 2020-01-16 | Discharge: 2020-01-16 | Disposition: A | Discharge status: Home / Self Care | Payer: BC Managed Care – PPO | Source: Ambulatory Visit | Attending: Emergency Medicine | Admitting: Emergency Medicine

## 2020-01-16 ENCOUNTER — Other Ambulatory Visit: Payer: Self-pay

## 2020-01-16 DIAGNOSIS — R922 Inconclusive mammogram: Secondary | ICD-10-CM | POA: Diagnosis not present

## 2020-01-16 DIAGNOSIS — N63 Unspecified lump in unspecified breast: Secondary | ICD-10-CM

## 2020-01-17 ENCOUNTER — Telehealth: Payer: Self-pay | Admitting: Internal Medicine

## 2020-01-17 NOTE — Telephone Encounter (Signed)
Patient has strong odor in urine, has tried hydrating and still has odor. Patient is concerned something may be wrong. Please contact patient as to what she needs to do.

## 2020-01-17 NOTE — Telephone Encounter (Signed)
Patient should either go to urgent care for evaluation or discuss this when she is seen at Urology.  I do not see any notes stating that she has been scheduled with anybody. Please follow up on that & give appointment details to patient.

## 2020-01-19 ENCOUNTER — Ambulatory Visit
Admission: EM | Admit: 2020-01-19 | Discharge: 2020-01-19 | Disposition: A | Discharge status: Home / Self Care | Payer: BC Managed Care – PPO | Attending: Family Medicine | Admitting: Family Medicine

## 2020-01-19 DIAGNOSIS — N3001 Acute cystitis with hematuria: Secondary | ICD-10-CM

## 2020-01-19 LAB — POCT URINALYSIS DIP (MANUAL ENTRY)
Bilirubin, UA: NEGATIVE
Glucose, UA: NEGATIVE mg/dL
Ketones, POC UA: NEGATIVE mg/dL
Nitrite, UA: POSITIVE — AB
Protein Ur, POC: NEGATIVE mg/dL
Spec Grav, UA: >=1.03 — AB (ref 1.010–1.025)
Urobilinogen, UA: 1 E.U./dL
pH, UA: 7 (ref 5.0–8.0)

## 2020-01-19 MED ORDER — NITROFURANTOIN MONOHYD MACRO 100 MG PO CAPS: 100.0000 mg | ORAL_CAPSULE | Freq: Two times a day (BID) | ORAL | 0 refills | Status: DC

## 2020-01-19 NOTE — ED Triage Notes (Signed)
Pt c/o urinary difficulty with frequency, burning and odor since Wednesday.

## 2020-01-19 NOTE — Discharge Instructions (Signed)
Treating you for a UTI We will send for culture Drink plenty of water.  Follow up as needed for continued or worsening symptoms

## 2020-01-21 LAB — URINE CULTURE: Culture: >=100000 — AB

## 2020-01-21 NOTE — ED Provider Notes (Signed)
[name]    CSN: [id number] Arrival date & time: 01/19/20  1316      History   Chief Complaint Chief Complaint  Patient presents with  . Urinary Tract Infection    HPI [name] is a 39 y.o. female.   Patient is a 39 year old female presents today with urinary tension, frequency, dysuria and urinary odor since Wednesday.  Symptoms been constant.  She has history of recurrent UTIs.  Denies any associated abdominal pain, back pain, fevers, nausea or vomiting.  Denies any vaginal discharge, itching or irritation.  Denies any concern for STDs.Patient's last menstrual period was 01/05/2020.  ROS per HPI      Past Medical History:  Diagnosis Date  . Abnormal Pap smear   . Chlamydia   . Gonorrhea   . Headache(784.0)   . HPV (human papilloma virus) infection   . Trichomonas   . Urinary tract infection     There are no problems to display for this patient.   Past Surgical History:  Procedure Laterality Date  . CESAREAN SECTION  08/31/2011   Procedure: CESAREAN SECTION;  Surgeon: Agnes Lawrence, MD;  Location: [facility];  Service: Gynecology;  Laterality: N/A;  Primary Cesarean section Baby Boy @ 1418  . COLPOSCOPY  2010  . LEEP      OB History    Gravida  3   Para  3   Term  2   Preterm  1   AB      Living  3     SAB      TAB      Ectopic      Multiple      Live Births  3            Home Medications    Prior to Admission medications   Medication Sig Start Date End Date Taking? Authorizing Provider  nitrofurantoin, macrocrystal-monohydrate, (MACROBID) 100 MG capsule Take 1 capsule (100 mg total) by mouth 2 (two) times daily. 01/19/20   Orvan July, NP    Family History Family History  Problem Relation Age of Onset  . Hypertension Mother   . Diabetes Mother   . Stroke Mother     Social History Social History   Tobacco Use  . Smoking status: Current Every Day Smoker    Packs/day: 0.15    Years: 5.00   Pack years: 0.75    Types: Cigarettes  . Smokeless tobacco: Never Used  . Tobacco comment: quit with + preg  Substance Use Topics  . Alcohol use: No  . Drug use: No     Allergies   Patient has no known allergies.   Review of Systems Review of Systems   Physical Exam Triage Vital Signs ED Triage Vitals [01/19/20 1336]  Enc Vitals Group     BP 113/71     Pulse Rate (!) 102     Resp 16     Temp 98.2 F (36.8 C)     Temp Source Oral     SpO2 97 %     Weight      Height      Head Circumference      Peak Flow      Pain Score 0     Pain Loc      Pain Edu?      Excl. in [place]?    No data found.  Updated Vital Signs BP 113/71 (BP Location: Left Arm)   Pulse Marland Kitchen)  102   Temp 98.2 F (36.8 C) (Oral)   Resp 16   LMP 01/05/2020   SpO2 97%   Visual Acuity Right Eye Distance:   Left Eye Distance:   Bilateral Distance:    Right Eye Near:   Left Eye Near:    Bilateral Near:     Physical Exam Vitals and nursing note reviewed.  Constitutional:      General: She is not in acute distress.    Appearance: Normal appearance. She is not ill-appearing, toxic-appearing or diaphoretic.  HENT:     Head: Normocephalic.     Nose: Nose normal.  Eyes:     Conjunctiva/sclera: Conjunctivae normal.  Pulmonary:     Effort: Pulmonary effort is normal.  Musculoskeletal:        General: Normal range of motion.     Cervical back: Normal range of motion.  Skin:    General: Skin is warm and dry.     Findings: No rash.  Neurological:     Mental Status: She is alert.  Psychiatric:        Mood and Affect: Mood normal.      UC Treatments / Results  Labs (all labs ordered are listed, but only abnormal results are displayed) Labs Reviewed  URINE CULTURE - Abnormal; Notable for the following components:      Result Value   Culture >=100,000 COLONIES/mL ESCHERICHIA COLI (*)    Organism ID, Bacteria ESCHERICHIA COLI (*)    All other components within normal limits  POCT URINALYSIS  DIP (MANUAL ENTRY) - Abnormal; Notable for the following components:   Clarity, UA cloudy (*)    Spec Grav, UA >=1.030 (*)    Blood, UA trace-lysed (*)    Nitrite, UA Positive (*)    Leukocytes, UA Trace (*)    All other components within normal limits    EKG   Radiology No results found.  Procedures Procedures (including critical care time)  Medications Ordered in UC Medications - No data to display  Initial Impression / Assessment and Plan / UC Course  I have reviewed the triage vital signs and the nursing notes.  Pertinent labs & imaging results that were available during my care of the patient were reviewed by me and considered in my medical decision making (see chart for details).     UTI Urine with trace leuks, positive nitrites and trace blood Based on symptoms and history we will treat for UTI with Macrobid.  Sending for culture.  Recommended push fluids Follow up as needed for continued or worsening symptoms  Final Clinical Impressions(s) / UC Diagnoses   Final diagnoses:  Acute cystitis with hematuria     Discharge Instructions     Treating you for a UTI We will send for culture Drink plenty of water.  Follow up as needed for continued or worsening symptoms    ED Prescriptions    Medication Sig Dispense Auth. Provider   nitrofurantoin, macrocrystal-monohydrate, (MACROBID) 100 MG capsule Take 1 capsule (100 mg total) by mouth 2 (two) times daily. 10 capsule Loura Halt A, NP     PDMP not reviewed this encounter.   Orvan July, NP 01/21/20 1451

## 2020-01-24 ENCOUNTER — Telehealth: Payer: Self-pay

## 2020-01-24 NOTE — Telephone Encounter (Signed)
Called patient to do their pre-visit COVID screening.  Call went to voicemail. Unable to do prescreening.  

## 2020-01-24 NOTE — Patient Instructions (Signed)

## 2020-01-24 NOTE — Telephone Encounter (Signed)
Called patient to do their pre-visit COVID screening.  States that she is at work until 4:30p. She asked that I call her back after that to do screening.

## 2020-01-25 ENCOUNTER — Other Ambulatory Visit (HOSPITAL_COMMUNITY)
Admission: RE | Admit: 2020-01-25 | Discharge: 2020-01-25 | Disposition: A | Discharge status: Home / Self Care | Payer: BC Managed Care – PPO | Source: Ambulatory Visit | Attending: Internal Medicine | Admitting: Internal Medicine

## 2020-01-25 ENCOUNTER — Ambulatory Visit (INDEPENDENT_AMBULATORY_CARE_PROVIDER_SITE_OTHER): Payer: BC Managed Care – PPO | Admitting: Internal Medicine

## 2020-01-25 ENCOUNTER — Encounter: Payer: Self-pay | Admitting: Internal Medicine

## 2020-01-25 VITALS — BP 121/70 | HR 73 | Temp 97.3°F | Resp 17 | Wt 157.0 lb

## 2020-01-25 DIAGNOSIS — Z113 Encounter for screening for infections with a predominantly sexual mode of transmission: Secondary | ICD-10-CM | POA: Diagnosis not present

## 2020-01-25 DIAGNOSIS — K047 Periapical abscess without sinus: Secondary | ICD-10-CM | POA: Diagnosis not present

## 2020-01-25 DIAGNOSIS — N898 Other specified noninflammatory disorders of vagina: Secondary | ICD-10-CM

## 2020-01-25 DIAGNOSIS — Z124 Encounter for screening for malignant neoplasm of cervix: Secondary | ICD-10-CM

## 2020-01-25 DIAGNOSIS — Z0000 Encounter for general adult medical examination without abnormal findings: Secondary | ICD-10-CM | POA: Diagnosis not present

## 2020-01-25 MED ORDER — FLUCONAZOLE 150 MG PO TABS: ORAL_TABLET | ORAL | 0 refills | Status: DC

## 2020-01-25 MED ORDER — AMOXICILLIN-POT CLAVULANATE 875-125 MG PO TABS: 1.0000 | ORAL_TABLET | Freq: Two times a day (BID) | ORAL | 0 refills | Status: DC

## 2020-01-25 NOTE — Progress Notes (Signed)
Subjective:    [name] - 39 y.o. female MRN [id number]  Date of birth: 1981/06/11  HPI  [name] is here for annual exam. She requests STD screening. Reports abnormal PAP about 8-10 years ago with colposcopy performed. Normal screening since.   Has concern about a dental infection. Reports her right upper tooth feels infected again. She is having pain and swelling in the area. She does not want pain medication, says Ibuprofen will manage symptoms but requests antibiotic. Has the name of a dentist but says every time she schedules to have tooth pulled something comes up.      Health Maintenance:  Health Maintenance Due  Topic Date Due  . PAP SMEAR-Modifier  12/18/2013    -  reports that she has been smoking cigarettes. She has a 0.75 pack-year smoking history. She has never used smokeless tobacco. - Review of Systems: Per HPI. - Past Medical History: There are no problems to display for this patient.  - Medications: reviewed and updated   Objective:   Physical Exam BP 121/70   Pulse 73   Temp (!) 97.3 F (36.3 C) (Temporal)   Resp 17   Wt 157 lb (71.2 kg)   LMP 01/05/2020   SpO2 96%   BMI 24.59 kg/m  Physical Exam  Constitutional: She is oriented to person, place, and time and well-developed, well-nourished, and in no distress.  HENT:  Head: Normocephalic and atraumatic.  Mouth/Throat: Oropharynx is clear and moist.  Broken right upper molar with drainage and gum edema. Slight facial swelling over the gumline.   Eyes: Pupils are equal, round, and reactive to light. Conjunctivae and EOM are normal.  Neck: No thyromegaly present.  Cardiovascular: Normal rate, regular rhythm, normal heart sounds and intact distal pulses.  No murmur heard. Pulmonary/Chest: Effort normal and breath sounds normal. No respiratory distress. She has no wheezes.  Abdominal: Soft. Bowel sounds are normal. She exhibits no distension. There is no abdominal tenderness. There is  no rebound and no guarding.  Genitourinary:    Genitourinary Comments: GU/GYN: Exam performed in the presence of a chaperone. External genitalia within normal limits.  Vaginal mucosa pink, moist, normal rugae.  Nonfriable cervix without lesions or bleeding noted on speculum exam. Copious amount of think white discharge is present. Bimanual exam revealed normal, nongravid uterus.  No cervical motion tenderness. No adnexal masses bilaterally.     Musculoskeletal:        General: No deformity or edema. Normal range of motion.     Cervical back: Normal range of motion and neck supple.  Lymphadenopathy:    She has no cervical adenopathy.  Neurological: She is alert and oriented to person, place, and time. Gait normal.  Skin: Skin is warm and dry. No rash noted. She is not diaphoretic.  Psychiatric: Mood, affect and judgment normal.           Assessment & Plan:   1. Annual physical exam - CBC with Differential - Comprehensive metabolic panel  2. Pap smear for cervical cancer screening - Cytology - PAP([facility])  3. Screening for STDs (sexually transmitted diseases) - Cervicovaginal ancillary only - HIV Antibody (routine testing w rflx) - [facility]PR  4. Dental infection Counseled she will need to have tooth pulled otherwise will continue to have recurrent infections.  - amoxicillin-clavulanate (AUGMENTIN) 875-125 MG tablet; Take 1 tablet by mouth 2 (two) times daily.  Dispense: 14 tablet; Refill: 0  5. Vaginal Discharge Appears most consistent with yeast especially  given recent antibiotic for acute cystitis. However, patient is asymptomatic. Given prescribing another antibiotic, will send in Rx for Diflucan. Will test for other etiologies of vaginal discharge.  - fluconazole (DIFLUCAN) 150 MG tablet; Take one tablet now. Take second tablet in 72 hours if still feeling symptoms.  Dispense: 2 tablet; Refill: 0      Phill Myron, D.O. 01/25/2020, 1:47 PM Primary Care at [facility]

## 2020-01-26 LAB — CBC WITH DIFFERENTIAL/PLATELET
Basophils Absolute: 0 10*3/uL (ref 0.0–0.2)
Basos: 0 %
EOS (ABSOLUTE): 0 10*3/uL (ref 0.0–0.4)
Eos: 0 %
Hematocrit: 31.1 % — ABNORMAL LOW (ref 34.0–46.6)
Hemoglobin: 10.8 g/dL — ABNORMAL LOW (ref 11.1–15.9)
Immature Grans (Abs): 0 10*3/uL (ref 0.0–0.1)
Immature Granulocytes: 0 %
Lymphocytes Absolute: 2.2 10*3/uL (ref 0.7–3.1)
Lymphs: 29 %
MCH: 31.9 pg (ref 26.6–33.0)
MCHC: 34.7 g/dL (ref 31.5–35.7)
MCV: 92 fL (ref 79–97)
Monocytes Absolute: 0.3 10*3/uL (ref 0.1–0.9)
Monocytes: 3 %
Neutrophils Absolute: 5.1 10*3/uL (ref 1.4–7.0)
Neutrophils: 68 %
Platelets: 197 10*3/uL (ref 150–450)
RBC: 3.39 x10E6/uL — ABNORMAL LOW (ref 3.77–5.28)
RDW: 11.8 % (ref 11.7–15.4)
WBC: 7.6 10*3/uL (ref 3.4–10.8)

## 2020-01-26 LAB — CERVICOVAGINAL ANCILLARY ONLY
Bacterial Vaginitis (gardnerella): NEGATIVE
Candida Glabrata: NEGATIVE
Candida Vaginitis: NEGATIVE
Chlamydia: NEGATIVE
Comment: NEGATIVE
Comment: NEGATIVE
Comment: NEGATIVE
Comment: NEGATIVE
Comment: NEGATIVE
Comment: NORMAL
Neisseria Gonorrhea: NEGATIVE
Trichomonas: NEGATIVE

## 2020-01-26 LAB — COMPREHENSIVE METABOLIC PANEL
ALT: 6 IU/L (ref 0–32)
AST: 13 IU/L (ref 0–40)
Albumin/Globulin Ratio: 2 (ref 1.2–2.2)
Albumin: 4.3 g/dL (ref 3.8–4.8)
Alkaline Phosphatase: 55 IU/L (ref 39–117)
BUN/Creatinine Ratio: 15 (ref 9–23)
BUN: 9 mg/dL (ref 6–20)
Bilirubin Total: 0.5 mg/dL (ref 0.0–1.2)
CO2: 24 mmol/L (ref 20–29)
Calcium: 8.9 mg/dL (ref 8.7–10.2)
Chloride: 104 mmol/L (ref 96–106)
Creatinine, Ser: 0.61 mg/dL (ref 0.57–1.00)
GFR calc Af Amer: 133 mL/min/{1.73_m2} (ref >59)
GFR calc non Af Amer: 115 mL/min/{1.73_m2} (ref >59)
Globulin, Total: 2.2 g/dL (ref 1.5–4.5)
Glucose: 79 mg/dL (ref 65–99)
Potassium: 3.9 mmol/L (ref 3.5–5.2)
Sodium: 140 mmol/L (ref 134–144)
Total Protein: 6.5 g/dL (ref 6.0–8.5)

## 2020-01-26 LAB — CYTOLOGY - PAP
Adequacy: ABSENT
Comment: NEGATIVE
Diagnosis: NEGATIVE
High risk HPV: NEGATIVE

## 2020-01-26 LAB — HIV ANTIBODY (ROUTINE TESTING W REFLEX): HIV Screen 4th Generation wRfx: NONREACTIVE

## 2020-01-26 LAB — RPR: RPR Ser Ql: NONREACTIVE

## 2020-01-29 ENCOUNTER — Other Ambulatory Visit: Payer: Self-pay | Admitting: Internal Medicine

## 2020-01-29 DIAGNOSIS — D649 Anemia, unspecified: Secondary | ICD-10-CM | POA: Insufficient documentation

## 2020-01-29 MED ORDER — FERROUS SULFATE 324 (65 FE) MG PO TBEC: 1.0000 | DELAYED_RELEASE_TABLET | ORAL | 0 refills | Status: DC

## 2020-01-29 NOTE — Progress Notes (Signed)
Patient notified of results & recommendations. Expressed understanding.

## 2020-03-13 DIAGNOSIS — R3121 Asymptomatic microscopic hematuria: Secondary | ICD-10-CM | POA: Diagnosis not present

## 2020-05-17 ENCOUNTER — Other Ambulatory Visit: Payer: Self-pay | Admitting: Internal Medicine

## 2020-08-11 ENCOUNTER — Other Ambulatory Visit: Payer: Self-pay

## 2020-08-11 ENCOUNTER — Ambulatory Visit
Admission: EM | Admit: 2020-08-11 | Discharge: 2020-08-11 | Disposition: A | Discharge status: Home / Self Care | Payer: BC Managed Care – PPO | Attending: Physician Assistant | Admitting: Physician Assistant

## 2020-08-11 DIAGNOSIS — R350 Frequency of micturition: Secondary | ICD-10-CM | POA: Diagnosis not present

## 2020-08-11 DIAGNOSIS — M5442 Lumbago with sciatica, left side: Secondary | ICD-10-CM | POA: Insufficient documentation

## 2020-08-11 DIAGNOSIS — M5441 Lumbago with sciatica, right side: Secondary | ICD-10-CM | POA: Diagnosis not present

## 2020-08-11 LAB — POCT URINALYSIS DIP (MANUAL ENTRY)
Bilirubin, UA: NEGATIVE
Glucose, UA: NEGATIVE mg/dL
Ketones, POC UA: NEGATIVE mg/dL
Leukocytes, UA: NEGATIVE
Nitrite, UA: POSITIVE — AB
Protein Ur, POC: NEGATIVE mg/dL
Spec Grav, UA: >=1.03 — AB (ref 1.010–1.025)
Urobilinogen, UA: 1 E.U./dL
pH, UA: 7 (ref 5.0–8.0)

## 2020-08-11 LAB — POCT URINE PREGNANCY: Preg Test, Ur: NEGATIVE

## 2020-08-11 MED ORDER — MELOXICAM 7.5 MG PO TABS: 7.5000 mg | ORAL_TABLET | Freq: Every day | ORAL | 0 refills | Status: DC

## 2020-08-11 MED ORDER — TIZANIDINE HCL 2 MG PO TABS: 2.0000 mg | ORAL_TABLET | Freq: Three times a day (TID) | ORAL | 0 refills | Status: DC | PRN

## 2020-08-11 MED ORDER — CEPHALEXIN 500 MG PO CAPS: 500.0000 mg | ORAL_CAPSULE | Freq: Two times a day (BID) | ORAL | 0 refills | Status: DC

## 2020-08-11 NOTE — Discharge Instructions (Signed)
Back pain Start Mobic. Do not take ibuprofen (motrin/advil)/ naproxen (aleve) while on mobic. Tizanidine as needed, this can make you drowsy, so do not take if you are going to drive, operate heavy machinery, or make important decisions. Ice/heat compresses as needed. This can take up to 3-4 weeks to completely resolve, but you should be feeling better each week. Follow up with PCP/orthopedics if symptoms worsen, changes for reevaluation. If experience numbness/tingling of the inner thighs, loss of bladder or bowel control, go to the emergency department for evaluation.   Urinary frequency Your urine was positive for an urinary tract infection. Start keflex as directed. Keep hydrated, urine should be clear to pale yellow in color. Monitor for any worsening of symptoms, fever, worsening abdominal pain, nausea/vomiting, flank pain, follow up for reevaluation.

## 2020-08-11 NOTE — ED Provider Notes (Signed)
[name]    CSN: [id number] Arrival date & time: 08/11/20  1010      History   Chief Complaint Chief Complaint  Patient presents with  . Back Pain    lower x 1 month    HPI [name] is a 39 y.o. female.   39 year old female comes in for 1 month history of atraumatic back pain. Pain to bilateral lower back, radiates to bilateral legs. Denies saddle anesthesia, loss of bladder or bowel control. States has had urinary frequency and strong urine odor. Denies dysuria, hematuria, fever. otc medicine without relief.      Past Medical History:  Diagnosis Date  . Abnormal Pap smear   . Chlamydia   . Gonorrhea   . Headache(784.0)   . HPV (human papilloma virus) infection   . Trichomonas   . Urinary tract infection     Patient Active Problem List   Diagnosis Date Noted  . Anemia 01/29/2020    Past Surgical History:  Procedure Laterality Date  . CESAREAN SECTION  08/31/2011   Procedure: CESAREAN SECTION;  Surgeon: Agnes Lawrence, MD;  Location: [facility];  Service: Gynecology;  Laterality: N/A;  Primary Cesarean section Baby Boy @ 1418  . COLPOSCOPY  2010  . LEEP      OB History    Gravida  3   Para  3   Term  2   Preterm  1   AB      Living  3     SAB      TAB      Ectopic      Multiple      Live Births  3            Home Medications    Prior to Admission medications   Medication Sig Start Date End Date Taking? Authorizing Provider  cephALEXin (KEFLEX) 500 MG capsule Take 1 capsule (500 mg total) by mouth 2 (two) times daily. 08/11/20   Tasia Catchings, Tacarra Justo V, PA-C  ferrous sulfate 324 (65 Fe) MG TBEC Take 1 tablet (325 mg total) by mouth every other day. 01/29/20   Nicolette Bang, DO  meloxicam (MOBIC) 7.5 MG tablet Take 1 tablet (7.5 mg total) by mouth daily. 08/11/20   Tasia Catchings, Kaja Jackowski V, PA-C  tiZANidine (ZANAFLEX) 2 MG tablet Take 1 tablet (2 mg total) by mouth every 8 (eight) hours as needed for muscle spasms. 08/11/20    Ok Edwards, PA-C    Family History Family History  Problem Relation Age of Onset  . Hypertension Mother   . Diabetes Mother   . Stroke Mother     Social History Social History   Tobacco Use  . Smoking status: Current Every Day Smoker    Packs/day: 0.15    Years: 5.00    Pack years: 0.75    Types: Cigarettes  . Smokeless tobacco: Never Used  . Tobacco comment: quit with + preg  Vaping Use  . Vaping Use: Never used  Substance Use Topics  . Alcohol use: No  . Drug use: No     Allergies   Patient has no known allergies.   Review of Systems Review of Systems  Reason unable to perform ROS: See HPI as above.     Physical Exam Triage Vital Signs ED Triage Vitals  Enc Vitals Group     BP 08/11/20 1036 110/71     Pulse Rate 08/11/20 1036 92     Resp  08/11/20 1036 17     Temp 08/11/20 1036 98 F (36.7 C)     Temp Source 08/11/20 1036 Oral     SpO2 08/11/20 1036 (!) 2 %     Weight --      Height --      Head Circumference --      Peak Flow --      Pain Score 08/11/20 1038 0     Pain Loc --      Pain Edu? --      Excl. in [place]? --    No data found.  Updated Vital Signs BP 110/71 (BP Location: Left Arm)   Pulse 92   Temp 98 F (36.7 C) (Oral)   Resp 17   SpO2 98%    Physical Exam Constitutional:      General: She is not in acute distress.    Appearance: She is well-developed. She is not diaphoretic.  HENT:     Head: Normocephalic and atraumatic.  Eyes:     Conjunctiva/sclera: Conjunctivae normal.     Pupils: Pupils are equal, round, and reactive to light.  Cardiovascular:     Rate and Rhythm: Normal rate and regular rhythm.     Heart sounds: Normal heart sounds. No murmur heard.  No friction rub. No gallop.   Pulmonary:     Effort: Pulmonary effort is normal. No accessory muscle usage or respiratory distress.     Breath sounds: Normal breath sounds. No stridor. No decreased breath sounds, wheezing, rhonchi or rales.  Musculoskeletal:     Comments:  No tenderness on palpation of the spinous processes. Tenderness to palpation of bilateral lumbar back. No tenderness to hip. Decreased ROM of back due to pain. Full ROM of hip. + straight leg raise bilaterally.  Skin:    General: Skin is warm and dry.  Neurological:     Mental Status: She is alert and oriented to person, place, and time.      UC Treatments / Results  Labs (all labs ordered are listed, but only abnormal results are displayed) Labs Reviewed  POCT URINALYSIS DIP (MANUAL ENTRY) - Abnormal; Notable for the following components:      Result Value   Clarity, UA cloudy (*)    Spec Grav, UA >=1.030 (*)    Blood, UA small (*)    Nitrite, UA Positive (*)    All other components within normal limits  URINE CULTURE  POCT URINE PREGNANCY    EKG   Radiology No results found.  Procedures Procedures (including critical care time)  Medications Ordered in UC Medications - No data to display  Initial Impression / Assessment and Plan / UC Course  I have reviewed the triage vital signs and the nursing notes.  Pertinent labs & imaging results that were available during my care of the patient were reviewed by me and considered in my medical decision making (see chart for details).    1. Low back pain with sciatica.  NSAID as directed. Muscle relaxant as needed. Ice/heat compresses. Expected course of healing discussed. Return precautions given.   2. Cystitis Urine dipstick positive for UTI. Start antibiotics as directed. Push fluids. Return precautions given.  Final Clinical Impressions(s) / UC Diagnoses   Final diagnoses:  Acute bilateral low back pain with bilateral sciatica  Urinary frequency    ED Prescriptions    Medication Sig Dispense Auth. Provider   meloxicam (MOBIC) 7.5 MG tablet Take 1 tablet (7.5 mg total) by mouth daily.  10 tablet Mariaeduarda Defranco V, PA-C   tiZANidine (ZANAFLEX) 2 MG tablet Take 1 tablet (2 mg total) by mouth every 8 (eight) hours as needed for  muscle spasms. 15 tablet Lakya Schrupp V, PA-C   cephALEXin (KEFLEX) 500 MG capsule Take 1 capsule (500 mg total) by mouth 2 (two) times daily. 10 capsule Ok Edwards, PA-C     PDMP not reviewed this encounter.   Ok Edwards, PA-C 08/11/20 1439

## 2020-08-11 NOTE — ED Triage Notes (Signed)
Pt states she has been experiencing back pain for one month and it has radiated to both legs/feet and has worsened for about a week. Pt states she was unable to get out of bed yesterday. Pt is aox4 and ambulatory at this time.

## 2020-08-14 LAB — URINE CULTURE: Culture: >=100000 — AB

## 2020-09-14 DIAGNOSIS — Z20822 Contact with and (suspected) exposure to covid-19: Secondary | ICD-10-CM | POA: Diagnosis not present

## 2020-10-29 ENCOUNTER — Ambulatory Visit
Admission: EM | Admit: 2020-10-29 | Discharge: 2020-10-29 | Disposition: A | Discharge status: Home / Self Care | Payer: BC Managed Care – PPO | Attending: Emergency Medicine | Admitting: Emergency Medicine

## 2020-10-29 ENCOUNTER — Other Ambulatory Visit: Payer: Self-pay

## 2020-10-29 DIAGNOSIS — M5442 Lumbago with sciatica, left side: Secondary | ICD-10-CM

## 2020-10-29 DIAGNOSIS — M5441 Lumbago with sciatica, right side: Secondary | ICD-10-CM | POA: Diagnosis not present

## 2020-10-29 DIAGNOSIS — Z20822 Contact with and (suspected) exposure to covid-19: Secondary | ICD-10-CM | POA: Diagnosis not present

## 2020-10-29 DIAGNOSIS — J069 Acute upper respiratory infection, unspecified: Secondary | ICD-10-CM

## 2020-10-29 MED ORDER — ONDANSETRON 4 MG PO TBDP: 4.0000 mg | ORAL_TABLET | Freq: Three times a day (TID) | ORAL | 0 refills | Status: DC | PRN

## 2020-10-29 MED ORDER — IBUPROFEN 800 MG PO TABS: 800.0000 mg | ORAL_TABLET | Freq: Three times a day (TID) | ORAL | 0 refills | Status: DC

## 2020-10-29 NOTE — Discharge Instructions (Signed)
Covid test pending, monitor my chart for results Ibuprofen and Tylenol for headache body aches Zofran as needed for nausea Rest and fluids Follow-up if not improving or worsening

## 2020-10-29 NOTE — ED Triage Notes (Signed)
Pt presents with nausea, headaches, body aches and numbness on right side of body from the shoulder down to the legs x 2 days. Pt states she is not able to eat.

## 2020-10-30 NOTE — ED Provider Notes (Signed)
[name]    CSN: [id number] Arrival date & time: 10/29/20  1805      History   Chief Complaint Chief Complaint  Patient presents with  . Generalized Body Aches  . Nausea  . Numbness  . Headache    HPI [name] is a 40 y.o. female presenting today for evaluation of body aches headaches and nausea.  Patient reports that over the past 2 days she has had chills body aches headaches.  Has had lower back pain with associated numbness and tingling throughout lower extremities.  Right greater than left.  Has had decreased appetite.  Denies vision changes.  Numbness tingling sensation is bilateral, denies associated unilateral weakness.  Denies dizziness.  HPI  Past Medical History:  Diagnosis Date  . Abnormal Pap smear   . Chlamydia   . Gonorrhea   . Headache(784.0)   . HPV (human papilloma virus) infection   . Trichomonas   . Urinary tract infection     Patient Active Problem List   Diagnosis Date Noted  . Anemia 01/29/2020    Past Surgical History:  Procedure Laterality Date  . CESAREAN SECTION  08/31/2011   Procedure: CESAREAN SECTION;  Surgeon: Roseanna RainbowLisa A Jackson-Moore, MD;  Location: [facility];  Service: Gynecology;  Laterality: N/A;  Primary Cesarean section Baby Boy @ 1418  . COLPOSCOPY  2010  . LEEP      OB History    Gravida  3   Para  3   Term  2   Preterm  1   AB      Living  3     SAB      IAB      Ectopic      Multiple      Live Births  3            Home Medications    Prior to Admission medications   Medication Sig Start Date End Date Taking? Authorizing Provider  ibuprofen (ADVIL) 800 MG tablet Take 1 tablet (800 mg total) by mouth 3 (three) times daily. 10/29/20  Yes Meghanne Pletz C, PA-C  ondansetron (ZOFRAN ODT) 4 MG disintegrating tablet Take 1 tablet (4 mg total) by mouth every 8 (eight) hours as needed for nausea or vomiting. 10/29/20  Yes Shaylen Nephew C, PA-C  cephALEXin (KEFLEX) 500 MG capsule Take 1  capsule (500 mg total) by mouth 2 (two) times daily. 08/11/20   Cathie HoopsYu, Amy V, PA-C  ferrous sulfate 324 (65 Fe) MG TBEC Take 1 tablet (325 mg total) by mouth every other day. 01/29/20   Arvilla MarketWallace, Catherine Lauren, DO  meloxicam (MOBIC) 7.5 MG tablet Take 1 tablet (7.5 mg total) by mouth daily. 08/11/20   Cathie HoopsYu, Amy V, PA-C  tiZANidine (ZANAFLEX) 2 MG tablet Take 1 tablet (2 mg total) by mouth every 8 (eight) hours as needed for muscle spasms. 08/11/20   Belinda FisherYu, Amy V, PA-C    Family History Family History  Problem Relation Age of Onset  . Hypertension Mother   . Diabetes Mother   . Stroke Mother     Social History Social History   Tobacco Use  . Smoking status: Current Every Day Smoker    Packs/day: 0.15    Years: 5.00    Pack years: 0.75    Types: Cigarettes  . Smokeless tobacco: Never Used  . Tobacco comment: quit with + preg  Vaping Use  . Vaping Use: Never used  Substance Use Topics  . Alcohol  use: No  . Drug use: No     Allergies   Patient has no known allergies.   Review of Systems Review of Systems  Constitutional: Positive for chills and fatigue. Negative for activity change, appetite change and fever.  HENT: Negative for congestion, ear pain, rhinorrhea, sinus pressure, sore throat and trouble swallowing.   Eyes: Negative for discharge and redness.  Respiratory: Negative for cough, chest tightness and shortness of breath.   Cardiovascular: Negative for chest pain.  Gastrointestinal: Positive for nausea. Negative for abdominal pain, diarrhea and vomiting.  Musculoskeletal: Negative for myalgias.  Skin: Negative for rash.  Neurological: Positive for light-headedness. Negative for dizziness and headaches.     Physical Exam Triage Vital Signs ED Triage Vitals  Enc Vitals Group     BP 10/29/20 1954 107/74     Pulse Rate 10/29/20 1954 100     Resp 10/29/20 1954 16     Temp 10/29/20 1954 98.9 F (37.2 C)     Temp Source 10/29/20 1954 Oral     SpO2 10/29/20 1954 96 %      Weight --      Height --      Head Circumference --      Peak Flow --      Pain Score 10/29/20 1953 7     Pain Loc --      Pain Edu? --      Excl. in GC? --    No data found.  Updated Vital Signs BP 107/74 (BP Location: Left Arm)   Pulse 100   Temp 98.9 F (37.2 C) (Oral)   Resp 16   LMP 10/09/2020 (Approximate)   SpO2 96%   Visual Acuity Right Eye Distance:   Left Eye Distance:   Bilateral Distance:    Right Eye Near:   Left Eye Near:    Bilateral Near:     Physical Exam Vitals and nursing note reviewed.  Constitutional:      Appearance: She is well-developed and well-nourished.     Comments: No acute distress  HENT:     Head: Normocephalic and atraumatic.     Ears:     Comments: Bilateral ears without tenderness to palpation of external auricle, tragus and mastoid, EAC's without erythema or swelling, TM's with good bony landmarks and cone of light. Non erythematous.     Nose: Nose normal.     Mouth/Throat:     Comments: Oral mucosa pink and moist, no tonsillar enlargement or exudate. Posterior pharynx patent and nonerythematous, no uvula deviation or swelling. Normal phonation.  Eyes:     Conjunctiva/sclera: Conjunctivae normal.  Cardiovascular:     Rate and Rhythm: Normal rate and regular rhythm.  Pulmonary:     Effort: Pulmonary effort is normal. No respiratory distress.     Comments: Breathing comfortably at rest, CTABL, no wheezing, rales or other adventitious sounds auscultated  Abdominal:     General: There is no distension.  Musculoskeletal:        General: Normal range of motion.     Cervical back: Neck supple.     Comments: Moving all extremities appropriately Strength at shoulders and grip strength 5/5 ankle bilaterally Hip and knee strength 5/5 and equal bilaterally Patellar reflex 2+ bilaterally  Bilateral lumbar areas tender to palpation  Skin:    General: Skin is warm and dry.  Neurological:     General: No focal deficit present.      Mental Status: She is alert and oriented  to person, place, and time. Mental status is at baseline.     Cranial Nerves: No cranial nerve deficit.     Motor: No weakness.     Gait: Gait normal.  Psychiatric:        Mood and Affect: Mood and affect normal.      UC Treatments / Results  Labs (all labs ordered are listed, but only abnormal results are displayed) Labs Reviewed  COVID-19, FLU A+B NAA    EKG   Radiology No results found.  Procedures Procedures (including critical care time)  Medications Ordered in UC Medications - No data to display  Initial Impression / Assessment and Plan / UC Course  I have reviewed the triage vital signs and the nursing notes.  Pertinent labs & imaging results that were available during my care of the patient were reviewed by me and considered in my medical decision making (see chart for details).     Covid/flu test pending, suspect viral etiology recommend symptomatic and supportive care, no neuro deficits noted on exam, paresthesias in extremities seems likely related to lower back pain rather than concern for intracranial abnormality.  Advised patient to continue to monitor, discussed warning signs for stroke.  Discussed strict return precautions. Patient verbalized understanding and is agreeable with plan.  Final Clinical Impressions(s) / UC Diagnoses   Final diagnoses:  Encounter for screening laboratory testing for COVID-19 virus  Viral URI with cough  Acute bilateral low back pain with bilateral sciatica     Discharge Instructions     Covid test pending, monitor my chart for results Ibuprofen and Tylenol for headache body aches Zofran as needed for nausea Rest and fluids Follow-up if not improving or worsening   ED Prescriptions    Medication Sig Dispense Auth. Provider   ibuprofen (ADVIL) 800 MG tablet Take 1 tablet (800 mg total) by mouth 3 (three) times daily. 21 tablet Gurdeep Keesey C, PA-C   ondansetron  (ZOFRAN ODT) 4 MG disintegrating tablet Take 1 tablet (4 mg total) by mouth every 8 (eight) hours as needed for nausea or vomiting. 20 tablet Kalif Kattner, [place] C, PA-C     PDMP not reviewed this encounter.   Cheyne Boulden, [place] C, PA-C 10/30/20 1004

## 2020-11-01 ENCOUNTER — Other Ambulatory Visit: Payer: BC Managed Care – PPO

## 2020-11-05 LAB — COVID-19, FLU A+B NAA
Influenza A, NAA: NOT DETECTED
Influenza B, NAA: NOT DETECTED
SARS-CoV-2, NAA: DETECTED — AB

## 2020-12-03 ENCOUNTER — Ambulatory Visit: Payer: BC Managed Care – PPO | Admitting: Physician Assistant

## 2020-12-03 ENCOUNTER — Encounter: Payer: Self-pay | Admitting: Physician Assistant

## 2020-12-03 ENCOUNTER — Other Ambulatory Visit: Payer: Self-pay

## 2020-12-03 VITALS — BP 101/70 | HR 80 | Temp 97.3°F | Resp 18 | Ht 68.0 in | Wt 155.0 lb

## 2020-12-03 DIAGNOSIS — M5441 Lumbago with sciatica, right side: Secondary | ICD-10-CM | POA: Diagnosis not present

## 2020-12-03 LAB — POCT URINALYSIS DIP (CLINITEK)
Bilirubin, UA: NEGATIVE
Glucose, UA: NEGATIVE mg/dL
Ketones, POC UA: NEGATIVE mg/dL
Leukocytes, UA: NEGATIVE
Nitrite, UA: NEGATIVE
POC PROTEIN,UA: NEGATIVE
Spec Grav, UA: 1.02 (ref 1.010–1.025)
Urobilinogen, UA: 1 E.U./dL
pH, UA: 7 (ref 5.0–8.0)

## 2020-12-03 MED ORDER — KETOROLAC TROMETHAMINE 60 MG/2ML IM SOLN
60.0000 mg | Freq: Once | INTRAMUSCULAR | Status: AC
  Administered 2020-12-03: 60 mg via INTRAMUSCULAR

## 2020-12-03 MED ORDER — METHYLPREDNISOLONE SODIUM SUCC 125 MG IJ SOLR
125.0000 mg | Freq: Once | INTRAMUSCULAR | Status: AC
  Administered 2020-12-03: 125 mg via INTRAMUSCULAR

## 2020-12-03 NOTE — Progress Notes (Signed)
Patient has not eaten today. Patient has taken bayer and body.

## 2020-12-03 NOTE — Progress Notes (Signed)
Established Patient Office Visit  Subjective:  Patient ID: [name], female    DOB: 11-16-1980  Age: 40 y.o. MRN: [id number]  CC:  Chief Complaint  Patient presents with   Back Pain    With sciatica on the right side    HPI [name] reports that she has been having some lower right back pain for the last few days, states that the pain has worsened in the last 2 days, states that she feels the pain running down her right leg, endorses numbness and tingling all the way to her foot.  Reports that she has tried bare aspirin and lidocaine patches with a little relief.  Denies injury or trauma, denies saddle anesthesia, denies history of back pain   Past Medical History:  Diagnosis Date   Abnormal Pap smear    Chlamydia    Gonorrhea    Headache(784.0)    HPV (human papilloma virus) infection    Trichomonas    Urinary tract infection     Past Surgical History:  Procedure Laterality Date   CESAREAN SECTION  08/31/2011   Procedure: CESAREAN SECTION;  Surgeon: Agnes Lawrence, MD;  Location: [facility];  Service: Gynecology;  Laterality: N/A;  Primary Cesarean section Baby Boy @ 66   COLPOSCOPY  2010   LEEP      Family History  Problem Relation Age of Onset   Hypertension Mother    Diabetes Mother    Stroke Mother     Social History   Socioeconomic History   Marital status: Single    Spouse name: Not on file   Number of children: Not on file   Years of education: Not on file   Highest education level: Not on file  Occupational History   Not on file  Tobacco Use   Smoking status: Current Every Day Smoker    Packs/day: 0.15    Years: 5.00    Pack years: 0.75    Types: Cigarettes   Smokeless tobacco: Never Used   Tobacco comment: quit with + preg  Vaping Use   Vaping Use: Never used  Substance and Sexual Activity   Alcohol use: No   Drug use: No   Sexual activity: Not on file    Comment: states no more (wk ago was  last)- caught him with someone else  Other Topics Concern   Not on file  Social History Narrative   Not on file   Social Determinants of Health   Financial Resource Strain: Not on file  Food Insecurity: Not on file  Transportation Needs: Not on file  Physical Activity: Not on file  Stress: Not on file  Social Connections: Not on file  Intimate Partner Violence: Not on file    Outpatient Medications Prior to Visit  Medication Sig Dispense Refill   cephALEXin (KEFLEX) 500 MG capsule Take 1 capsule (500 mg total) by mouth 2 (two) times daily. 10 capsule 0   ferrous sulfate 324 (65 Fe) MG TBEC Take 1 tablet (325 mg total) by mouth every other day. 90 tablet 0   ibuprofen (ADVIL) 800 MG tablet Take 1 tablet (800 mg total) by mouth 3 (three) times daily. 21 tablet 0   meloxicam (MOBIC) 7.5 MG tablet Take 1 tablet (7.5 mg total) by mouth daily. 10 tablet 0   ondansetron (ZOFRAN ODT) 4 MG disintegrating tablet Take 1 tablet (4 mg total) by mouth every 8 (eight) hours as needed for nausea or vomiting. 20 tablet 0  tiZANidine (ZANAFLEX) 2 MG tablet Take 1 tablet (2 mg total) by mouth every 8 (eight) hours as needed for muscle spasms. 15 tablet 0   No facility-administered medications prior to visit.    No Known Allergies  ROS Review of Systems  Constitutional: Negative for chills, fatigue and fever.  HENT: Negative.   Eyes: Negative.   Respiratory: Negative.   Cardiovascular: Negative.   Gastrointestinal: Negative.   Endocrine: Negative.   Genitourinary: Negative.   Musculoskeletal: Positive for back pain.  Skin: Negative.   Allergic/Immunologic: Negative.   Neurological: Negative.   Hematological: Negative.   Psychiatric/Behavioral: Negative.       Objective:    Physical Exam Vitals and nursing note reviewed.  Constitutional:      Appearance: Normal appearance.  HENT:     Head: Normocephalic and atraumatic.     Right Ear: External ear normal.     Left Ear:  External ear normal.     Nose: Nose normal.     Mouth/Throat:     Mouth: Mucous membranes are moist.     Pharynx: Oropharynx is clear.  Eyes:     Extraocular Movements: Extraocular movements intact.     Conjunctiva/sclera: Conjunctivae normal.     Pupils: Pupils are equal, round, and reactive to light.  Cardiovascular:     Rate and Rhythm: Normal rate and regular rhythm.     Pulses: Normal pulses.     Heart sounds: Normal heart sounds.  Pulmonary:     Effort: Pulmonary effort is normal.     Breath sounds: Normal breath sounds.  Musculoskeletal:     Cervical back: Normal, normal range of motion and neck supple.     Thoracic back: Normal.     Lumbar back: Tenderness present. No swelling. Decreased range of motion.  Skin:    General: Skin is warm and dry.  Neurological:     General: No focal deficit present.     Mental Status: She is alert and oriented to person, place, and time.  Psychiatric:        Mood and Affect: Mood normal.        Behavior: Behavior normal.        Thought Content: Thought content normal.        Judgment: Judgment normal.     BP 101/70 (BP Location: Left Arm, Patient Position: Sitting, Cuff Size: Normal)    Pulse 80    Temp (!) 97.3 F (36.3 C) (Oral)    Resp 18    Ht 5\' 8"  (1.727 m)    Wt 155 lb (70.3 kg)    LMP 11/09/2020    SpO2 98%    BMI 23.57 kg/m  Wt Readings from Last 3 Encounters:  12/03/20 155 lb (70.3 kg)  01/25/20 157 lb (71.2 kg)  01/01/20 158 lb (71.7 kg)     Health Maintenance Due  Topic Date Due   Hepatitis C Screening  Never done   COVID-19 Vaccine (1) Never done   INFLUENZA VACCINE  Never done    There are no preventive care reminders to display for this patient.  No results found for: TSH Lab Results  Component Value Date   WBC 7.6 01/25/2020   HGB 10.8 (L) 01/25/2020   HCT 31.1 (L) 01/25/2020   MCV 92 01/25/2020   PLT 197 01/25/2020   Lab Results  Component Value Date   NA 140 01/25/2020   K 3.9 01/25/2020    CO2 24 01/25/2020   GLUCOSE 79 01/25/2020  BUN 9 01/25/2020   CREATININE 0.61 01/25/2020   BILITOT 0.5 01/25/2020   ALKPHOS 55 01/25/2020   AST 13 01/25/2020   ALT 6 01/25/2020   PROT 6.5 01/25/2020   ALBUMIN 4.3 01/25/2020   CALCIUM 8.9 01/25/2020   No results found for: CHOL No results found for: HDL No results found for: LDLCALC No results found for: TRIG No results found for: CHOLHDL No results found for: HGBA1C    Assessment & Plan:   Problem List Items Addressed This Visit   None   Visit Diagnoses    Acute right-sided low back pain with right-sided sciatica    -  Primary   Relevant Medications   methylPREDNISolone sodium succinate (SOLU-MEDROL) 125 mg/2 mL injection 125 mg (Completed)   ketorolac (TORADOL) injection 60 mg (Completed)   Other Relevant Orders   POCT URINALYSIS DIP (CLINITEK) (Completed)     1. Acute right-sided low back pain with right-sided sciatica Patient education given, UA negative for UTI, continue over-the-counter pain medication as needed, stretching, ice, rest - POCT URINALYSIS DIP (CLINITEK) - methylPREDNISolone sodium succinate (SOLU-MEDROL) 125 mg/2 mL injection 125 mg - ketorolac (TORADOL) injection 60 mg  Meds ordered this encounter  Medications   methylPREDNISolone sodium succinate (SOLU-MEDROL) 125 mg/2 mL injection 125 mg   ketorolac (TORADOL) injection 60 mg    I have reviewed the patient's medical history (PMH, PSH, Social History, Family History, Medications, and allergies) , and have been updated if relevant. I spent 30 minutes reviewing chart and  face to face time with patient.     Follow-up: Return if symptoms worsen or fail to improve.    Loraine Grip Mayers, PA-C

## 2020-12-03 NOTE — Patient Instructions (Signed)
I encourage you to continue to get some rest, use over-the-counter pain relievers, you can continue using your lidocaine patches.  Make sure you are staying well-hydrated.  I encourage you to work on gentle stretching, and once you are feeling better work on improving your core strength.  Please let us know if there is anything else we can do for you, I hope that you feel better soon  Kennieth Rad, PA-C Physician Assistant [place] [email or web address]    Sciatica  Sciatica is pain, numbness, weakness, or tingling along the path of the sciatic nerve. The sciatic nerve starts in the lower back and runs down the back of each leg. The nerve controls the muscles in the lower leg and in the back of the knee. It also provides feeling (sensation) to the back of the thigh, the lower leg, and the sole of the foot. Sciatica is a symptom of another medical condition that pinches or puts pressure on the sciatic nerve. Sciatica most often only affects one side of the body. Sciatica usually goes away on its own or with treatment. In some cases, sciatica may come back (recur). What are the causes? This condition is caused by pressure on the sciatic nerve or pinching of the nerve. This may be the result of:  A disk in between the bones of the spine bulging out too far (herniated disk).  Age-related changes in the spinal disks.  A pain disorder that affects a muscle in the buttock.  Extra bone growth near the sciatic nerve.  A break (fracture) of the pelvis.  Pregnancy.  Tumor. This is rare. What increases the risk? The following factors may make you more likely to develop this condition:  Playing sports that place pressure or stress on the spine.  Having poor strength and flexibility.  A history of back injury or surgery.  Sitting for long periods of time.  Doing activities that involve repetitive bending or  lifting.  Obesity. What are the signs or symptoms? Symptoms can vary from mild to very severe, and they may include:  Any of these problems in the lower back, leg, hip, or buttock: ? Mild tingling, numbness, or dull aches. ? Burning sensations. ? Sharp pains.  Numbness in the back of the calf or the sole of the foot.  Leg weakness.  Severe back pain that makes movement difficult. Symptoms may get worse when you cough, sneeze, or laugh, or when you sit or stand for long periods of time. How is this diagnosed? This condition may be diagnosed based on:  Your symptoms and medical history.  A physical exam.  Blood tests.  Imaging tests, such as: ? X-rays. ? MRI. ? CT scan. How is this treated? In many cases, this condition improves on its own without treatment. However, treatment may include:  Reducing or modifying physical activity.  Exercising and stretching.  Icing and applying heat to the affected area.  Medicines that help to: ? Relieve pain and swelling. ? Relax your muscles.  Injections of medicines that help to relieve pain, irritation, and inflammation around the sciatic nerve (steroids).  Surgery. Follow these instructions at home: Medicines  Take over-the-counter and prescription medicines only as told by your health care provider.  Ask your health care provider if the medicine prescribed to you: ? Requires you to avoid driving or using heavy machinery. ? Can cause constipation. You may need to take these actions to prevent or treat constipation:  Drink enough fluid to keep  your urine pale yellow.  Take over-the-counter or prescription medicines.  Eat foods that are high in fiber, such as beans, whole grains, and fresh fruits and vegetables.  Limit foods that are high in fat and processed sugars, such as fried or sweet foods. Managing pain  If directed, put ice on the affected area. ? Put ice in a plastic bag. ? Place a towel between your skin  and the bag. ? Leave the ice on for 20 minutes, 2-3 times a day.  If directed, apply heat to the affected area. Use the heat source that your health care provider recommends, such as a moist heat pack or a heating pad. ? Place a towel between your skin and the heat source. ? Leave the heat on for 20-30 minutes. ? Remove the heat if your skin turns bright red. This is especially important if you are unable to feel pain, heat, or cold. You may have a greater risk of getting burned.      Activity  Return to your normal activities as told by your health care provider. Ask your health care provider what activities are safe for you.  Avoid activities that make your symptoms worse.  Take brief periods of rest throughout the day. ? When you rest for longer periods, mix in some mild activity or stretching between periods of rest. This will help to prevent stiffness and pain. ? Avoid sitting for long periods of time without moving. Get up and move around at least one time each hour.  Exercise and stretch regularly, as told by your health care provider.  Do not lift anything that is heavier than 10 lb (4.5 kg) while you have symptoms of sciatica. When you do not have symptoms, you should still avoid heavy lifting, especially repetitive heavy lifting.  When you lift objects, always use proper lifting technique, which includes: ? Bending your knees. ? Keeping the load close to your body. ? Avoiding twisting.   General instructions  Maintain a healthy weight. Excess weight puts extra stress on your back.  Wear supportive, comfortable shoes. Avoid wearing high heels.  Avoid sleeping on a mattress that is too soft or too hard. A mattress that is firm enough to support your back when you sleep may help to reduce your pain.  Keep all follow-up visits as told by your health care provider. This is important. Contact a health care provider if:  You have pain that: ? Wakes you up when you are  sleeping. ? Gets worse when you lie down. ? Is worse than you have experienced in the past. ? Lasts longer than 4 weeks.  You have an unexplained weight loss. Get help right away if:  You are not able to control when you urinate or have bowel movements (incontinence).  You have: ? Weakness in your lower back, pelvis, buttocks, or legs that gets worse. ? Redness or swelling of your back. ? A burning sensation when you urinate. Summary  Sciatica is pain, numbness, weakness, or tingling along the path of the sciatic nerve.  This condition is caused by pressure on the sciatic nerve or pinching of the nerve.  Sciatica can cause pain, numbness, or tingling in the lower back, legs, hips, and buttocks.  Treatment often includes rest, exercise, medicines, and applying ice or heat. This information is not intended to replace advice given to you by your health care provider. Make sure you discuss any questions you have with your health care provider. Document Revised: 10/31/2018  Document Reviewed: 10/31/2018 Elsevier Patient Education  2021 [place].    Sciatica Rehab Ask your health care provider which exercises are safe for you. Do exercises exactly as told by your health care provider and adjust them as directed. It is normal to feel mild stretching, pulling, tightness, or discomfort as you do these exercises. Stop right away if you feel sudden pain or your pain gets worse. Do not begin these exercises until told by your health care provider. Stretching and range-of-motion exercises These exercises warm up your muscles and joints and improve the movement and flexibility of your hips and back. These exercises also help to relieve pain, numbness, and tingling. Sciatic nerve glide 1. Sit in a chair with your head facing down toward your chest. Place your hands behind your back. Let your shoulders slump forward. 2. Slowly straighten one of your legs while you tilt your head back as if you  are looking toward the ceiling. Only straighten your leg as far as you can without making your symptoms worse. 3. Hold this position for __________ seconds. 4. Slowly return to the starting position. 5. Repeat with your other leg. Repeat __________ times. Complete this exercise __________ times a day. Knee to chest with hip adduction and internal rotation 1. Lie on your back on a firm surface with both legs straight. 2. Bend one of your knees and move it up toward your chest until you feel a gentle stretch in your lower back and buttock. Then, move your knee toward the shoulder that is on the opposite side from your leg. This is hip adduction and internal rotation. ? Hold your leg in this position by holding on to the front of your knee. 3. Hold this position for __________ seconds. 4. Slowly return to the starting position. 5. Repeat with your other leg. Repeat __________ times. Complete this exercise __________ times a day.   Prone extension on elbows 1. Lie on your abdomen on a firm surface. A bed may be too soft for this exercise. 2. Prop yourself up on your elbows. 3. Use your arms to help lift your chest up until you feel a gentle stretch in your abdomen and your lower back. ? This will place some of your body weight on your elbows. If this is uncomfortable, try stacking pillows under your chest. ? Your hips should stay down, against the surface that you are lying on. Keep your hip and back muscles relaxed. 4. Hold this position for __________ seconds. 5. Slowly relax your upper body and return to the starting position. Repeat __________ times. Complete this exercise __________ times a day.   Strengthening exercises These exercises build strength and endurance in your back. Endurance is the ability to use your muscles for a long time, even after they get tired. Pelvic tilt This exercise strengthens the muscles that lie deep in the abdomen. 1. Lie on your back on a firm surface. Bend  your knees and keep your feet flat on the floor. 2. Tense your abdominal muscles. Tip your pelvis up toward the ceiling and flatten your lower back into the floor. ? To help with this exercise, you may place a small towel under your lower back and try to push your back into the towel. 3. Hold this position for __________ seconds. 4. Let your muscles relax completely before you repeat this exercise. Repeat __________ times. Complete this exercise __________ times a day. Alternating arm and leg raises 1. Get on your hands and knees on  a firm surface. If you are on a hard floor, you may want to use padding, such as an exercise mat, to cushion your knees. 2. Line up your arms and legs. Your hands should be directly below your shoulders, and your knees should be directly below your hips. 3. Lift your left leg behind you. At the same time, raise your right arm and straighten it in front of you. ? Do not lift your leg higher than your hip. ? Do not lift your arm higher than your shoulder. ? Keep your abdominal and back muscles tight. ? Keep your hips facing the ground. ? Do not arch your back. ? Keep your balance carefully, and do not hold your breath. 4. Hold this position for __________ seconds. 5. Slowly return to the starting position. 6. Repeat with your right leg and your left arm. Repeat __________ times. Complete this exercise __________ times a day.   Posture and body mechanics Good posture and healthy body mechanics can help to relieve stress in your body's tissues and joints. Body mechanics refers to the movements and positions of your body while you do your daily activities. Posture is part of body mechanics. Good posture means:  Your spine is in its natural S-curve position (neutral).  Your shoulders are pulled back slightly.  Your head is not tipped forward. Follow these guidelines to improve your posture and body mechanics in your everyday activities. Standing  When standing,  keep your spine neutral and your feet about hip width apart. Keep a slight bend in your knees. Your ears, shoulders, and hips should line up.  When you do a task in which you stand in one place for a long time, place one foot up on a stable object that is 2-4 inches (5-10 cm) high, such as a footstool. This helps keep your spine neutral.   Sitting  When sitting, keep your spine neutral and keep your feet flat on the floor. Use a footrest, if necessary, and keep your thighs parallel to the floor. Avoid rounding your shoulders, and avoid tilting your head forward.  When working at a desk or a computer, keep your desk at a height where your hands are slightly lower than your elbows. Slide your chair under your desk so you are close enough to maintain good posture.  When working at a computer, place your monitor at a height where you are looking straight ahead and you do not have to tilt your head forward or downward to look at the screen.   Resting  When lying down and resting, avoid positions that are most painful for you.  If you have pain with activities such as sitting, bending, stooping, or squatting, lie in a position in which your body does not bend very much. For example, avoid curling up on your side with your arms and knees near your chest (fetal position).  If you have pain with activities such as standing for a long time or reaching with your arms, lie with your spine in a neutral position and bend your knees slightly. Try the following positions: ? Lying on your side with a pillow between your knees. ? Lying on your back with a pillow under your knees. Lifting  When lifting objects, keep your feet at least shoulder width apart and tighten your abdominal muscles.  Bend your knees and hips and keep your spine neutral. It is important to lift using the strength of your legs, not your back. Do not lock your knees  straight out.  Always ask for help to lift heavy or awkward objects.    This information is not intended to replace advice given to you by your health care provider. Make sure you discuss any questions you have with your health care provider. Document Revised: 02/03/2019 Document Reviewed: 11/03/2018 Elsevier Patient Education  [place].

## 2020-12-04 DIAGNOSIS — M5441 Lumbago with sciatica, right side: Secondary | ICD-10-CM | POA: Insufficient documentation

## 2021-04-30 ENCOUNTER — Other Ambulatory Visit: Payer: Self-pay

## 2021-04-30 ENCOUNTER — Ambulatory Visit
Admission: RE | Admit: 2021-04-30 | Discharge: 2021-04-30 | Disposition: A | Discharge status: Home / Self Care | Payer: BC Managed Care – PPO | Source: Ambulatory Visit | Attending: Family Medicine | Admitting: Family Medicine

## 2021-04-30 VITALS — BP 102/69 | HR 78 | Temp 98.6°F | Resp 18

## 2021-04-30 DIAGNOSIS — N760 Acute vaginitis: Secondary | ICD-10-CM

## 2021-04-30 MED ORDER — METRONIDAZOLE 500 MG PO TABS: 500.0000 mg | ORAL_TABLET | Freq: Two times a day (BID) | ORAL | 0 refills | Status: DC

## 2021-04-30 NOTE — ED Triage Notes (Signed)
Patient c/o vaginal discharge x 2 weeks.   Patient endorses foul odor stating " it just doesn't smell like my normal".   Patient denies ABD pain, back pain, and dysuria.   Patient endorses vaginal itching and "thick, whit discharge".   Patient has taken AZO with no relief of symptoms.

## 2021-04-30 NOTE — ED Provider Notes (Signed)
[name]    CSN: [id number] Arrival date & time: 04/30/21  1531      History   Chief Complaint Chief Complaint  Patient presents with   Vaginal Discharge   1600 APPT    HPI [name] is a 40 y.o. female.   Patient presenting today with 2-week history of vaginal discharge, itching, irritation, odor.  She denies vaginal bleeding, pelvic pain, abdominal pain, fever, chills, dysuria, hematuria, nausea vomiting or diarrhea.  So far has tried Monistat and Azo with no relief.  No new products used or sexual partners.  LMP 04/25/2021.  States history of BV and yeast infections that have felt similar.   Past Medical History:  Diagnosis Date   Abnormal Pap smear    Chlamydia    Gonorrhea    Headache(784.0)    HPV (human papilloma virus) infection    Trichomonas    Urinary tract infection     Patient Active Problem List   Diagnosis Date Noted   Acute right-sided low back pain with right-sided sciatica 12/04/2020   Anemia 01/29/2020    Past Surgical History:  Procedure Laterality Date   CESAREAN SECTION  08/31/2011   Procedure: CESAREAN SECTION;  Surgeon: Agnes Lawrence, MD;  Location: [facility];  Service: Gynecology;  Laterality: N/A;  Primary Cesarean section Baby Boy @ 1418   COLPOSCOPY  2010   LEEP      OB History     Gravida  3   Para  3   Term  2   Preterm  1   AB      Living  3      SAB      IAB      Ectopic      Multiple      Live Births  3            Home Medications    Prior to Admission medications   Medication Sig Start Date End Date Taking? Authorizing Provider  metroNIDAZOLE (FLAGYL) 500 MG tablet Take 1 tablet (500 mg total) by mouth 2 (two) times daily. 04/30/21  Yes Volney American, PA-C  cephALEXin (KEFLEX) 500 MG capsule Take 1 capsule (500 mg total) by mouth 2 (two) times daily. 08/11/20   Tasia Catchings, Amy V, PA-C  ferrous sulfate 324 (65 Fe) MG TBEC Take 1 tablet (325 mg total) by mouth every other day.  01/29/20   Nicolette Bang, MD  ibuprofen (ADVIL) 800 MG tablet Take 1 tablet (800 mg total) by mouth 3 (three) times daily. 10/29/20   Wieters, Hallie C, PA-C  meloxicam (MOBIC) 7.5 MG tablet Take 1 tablet (7.5 mg total) by mouth daily. 08/11/20   Tasia Catchings, Amy V, PA-C  ondansetron (ZOFRAN ODT) 4 MG disintegrating tablet Take 1 tablet (4 mg total) by mouth every 8 (eight) hours as needed for nausea or vomiting. 10/29/20   Wieters, Hallie C, PA-C  tiZANidine (ZANAFLEX) 2 MG tablet Take 1 tablet (2 mg total) by mouth every 8 (eight) hours as needed for muscle spasms. 08/11/20   Ok Edwards, PA-C    Family History Family History  Problem Relation Age of Onset   Hypertension Mother    Diabetes Mother    Stroke Mother     Social History Social History   Tobacco Use   Smoking status: Every Day    Packs/day: 0.15    Years: 5.00    Pack years: 0.75    Types: Cigarettes   Smokeless  tobacco: Never   Tobacco comments:    quit with + preg  Vaping Use   Vaping Use: Never used  Substance Use Topics   Alcohol use: No   Drug use: No     Allergies   Patient has no known allergies.   Review of Systems Review of Systems Per HPI  Physical Exam Triage Vital Signs ED Triage Vitals  Enc Vitals Group     BP 04/30/21 1606 102/69     Pulse Rate 04/30/21 1606 78     Resp 04/30/21 1606 18     Temp 04/30/21 1606 98.6 F (37 C)     Temp Source 04/30/21 1606 Oral     SpO2 04/30/21 1606 98 %     Weight --      Height --      Head Circumference --      Peak Flow --      Pain Score 04/30/21 1617 0     Pain Loc --      Pain Edu? --      Excl. in [place]? --    No data found.  Updated Vital Signs BP 102/69 (BP Location: Left Arm)   Pulse 78   Temp 98.6 F (37 C) (Oral)   Resp 18   LMP 04/25/2021   SpO2 98%   Visual Acuity Right Eye Distance:   Left Eye Distance:   Bilateral Distance:    Right Eye Near:   Left Eye Near:    Bilateral Near:     Physical Exam Vitals and nursing  note reviewed.  Constitutional:      Appearance: Normal appearance. She is not ill-appearing.  HENT:     Head: Atraumatic.     Mouth/Throat:     Mouth: Mucous membranes are moist.     Pharynx: Oropharynx is clear.  Eyes:     Extraocular Movements: Extraocular movements intact.     Conjunctiva/sclera: Conjunctivae normal.  Cardiovascular:     Rate and Rhythm: Normal rate and regular rhythm.     Heart sounds: Normal heart sounds.  Pulmonary:     Effort: Pulmonary effort is normal.     Breath sounds: Normal breath sounds.  Abdominal:     General: Bowel sounds are normal. There is no distension.     Palpations: Abdomen is soft.     Tenderness: There is no abdominal tenderness. There is no right CVA tenderness, left CVA tenderness or guarding.  Genitourinary:    Comments: GU exam deferred, self swab performed Musculoskeletal:        General: Normal range of motion.     Cervical back: Normal range of motion and neck supple.  Skin:    General: Skin is warm and dry.  Neurological:     Mental Status: She is alert and oriented to person, place, and time.  Psychiatric:        Mood and Affect: Mood normal.        Thought Content: Thought content normal.        Judgment: Judgment normal.   UC Treatments / Results  Labs (all labs ordered are listed, but only abnormal results are displayed) Labs Reviewed  CERVICOVAGINAL ANCILLARY ONLY    EKG   Radiology No results found.  Procedures Procedures (including critical care time)  Medications Ordered in UC Medications - No data to display  Initial Impression / Assessment and Plan / UC Course  I have reviewed the triage vital signs and the nursing notes.  Pertinent labs & imaging results that were available during my care of the patient were reviewed by me and considered in my medical decision making (see chart for details).     exam and vitals reassuring, will treat with metronidazole for suspected BV while awaiting vaginal  swab results.  Adjust as needed based on these results.  Discussed unscented hygiene products, safe sexual practices.  Return for acutely worsening symptoms.  Final Clinical Impressions(s) / UC Diagnoses   Final diagnoses:  Acute vaginitis   Discharge Instructions   None    ED Prescriptions     Medication Sig Dispense Auth. Provider   metroNIDAZOLE (FLAGYL) 500 MG tablet Take 1 tablet (500 mg total) by mouth 2 (two) times daily. 14 tablet Volney American, [place]      PDMP not reviewed this encounter.   Volney American, [place] 04/30/21 1640

## 2021-05-01 LAB — CERVICOVAGINAL ANCILLARY ONLY
Bacterial Vaginitis (gardnerella): POSITIVE — AB
Candida Glabrata: NEGATIVE
Candida Vaginitis: NEGATIVE
Chlamydia: NEGATIVE
Comment: NEGATIVE
Comment: NEGATIVE
Comment: NEGATIVE
Comment: NEGATIVE
Comment: NEGATIVE
Comment: NORMAL
Neisseria Gonorrhea: NEGATIVE
Trichomonas: NEGATIVE

## 2021-06-09 NOTE — Progress Notes (Signed)
Patient ID: [name], female    DOB: 16-Mar-1981  MRN: [id number]  CC: Annual Physical Exam  Subjective: [name] is a 40 y.o. female who presents for annual physical exam.   Her concerns today include:  INSOMNIA: Difficulty sleeping at nighttime. Reports mostly related to worrying about her children and the many things that are going on in the world. Does work a first shift job. Reports she is forgetful during the day sometimes forgetting items on grocery list for example. Denies thoughts of self-harm, suicidal ideations, and homicidal ideations.   2. URINARY CONCERNS: Reports urinary frequency and vaginal discharge with mild itching. Concern for having repeat yeast infections and vaginal infections. Reports medication which treats for one causes the other to begin. Has used both pills and vaginal creams. This has been ongoing for some time.   3. WRIST CONCERN: Reports a knot on right wrist for over 1 year, non-painful. In the past seen at the local health department and told to hit it with a Bible and it will go away. Denies altering activities of daily living.   Patient Active Problem List   Diagnosis Date Noted   Insomnia 06/11/2021   Acute right-sided low back pain with right-sided sciatica 12/04/2020   Anemia 01/29/2020     No current outpatient medications on file prior to visit.   No current facility-administered medications on file prior to visit.    No Known Allergies  Social History   Socioeconomic History   Marital status: Single    Spouse name: Not on file   Number of children: Not on file   Years of education: Not on file   Highest education level: Not on file  Occupational History   Not on file  Tobacco Use   Smoking status: Every Day    Packs/day: 0.25    Years: 5.00    Pack years: 1.25    Types: Cigarettes   Smokeless tobacco: Never   Tobacco comments:    quit with + preg  Vaping Use   Vaping Use: Never used  Substance and  Sexual Activity   Alcohol use: Yes    Alcohol/week: 1.0 standard drink    Types: 1 Standard drinks or equivalent per week    Comment: socially   Drug use: No   Sexual activity: Not on file    Comment: states no more (wk ago was last)- caught him with someone else  Other Topics Concern   Not on file  Social History Narrative   Not on file   Social Determinants of Health   Financial Resource Strain: Not on file  Food Insecurity: Not on file  Transportation Needs: Not on file  Physical Activity: Not on file  Stress: Not on file  Social Connections: Not on file  Intimate Partner Violence: Not on file    Family History  Problem Relation Age of Onset   Hypertension Mother    Diabetes Mother    Stroke Mother     Past Surgical History:  Procedure Laterality Date   CESAREAN SECTION  08/31/2011   Procedure: CESAREAN SECTION;  Surgeon: Agnes Lawrence, MD;  Location: [facility];  Service: Gynecology;  Laterality: N/A;  Primary Cesarean section Baby Boy @ 1418   COLPOSCOPY  2010   LEEP      ROS: Review of Systems Negative except as stated above  PHYSICAL EXAM: BP 95/62 (BP Location: Left Arm, Patient Position: Sitting, Cuff Size: Normal)   Pulse (!) 104  Temp 98.3 F (36.8 C)   Resp 18   Ht 5' 7.99" (1.727 m)   Wt 161 lb 3.2 oz (73.1 kg)   SpO2 97%   BMI 24.52 kg/m   Physical Exam HENT:     Head: Normocephalic and atraumatic.     Right Ear: Tympanic membrane, ear canal and external ear normal.     Left Ear: Tympanic membrane, ear canal and external ear normal.  Eyes:     Extraocular Movements: Extraocular movements intact.     Conjunctiva/sclera: Conjunctivae normal.     Pupils: Pupils are equal, round, and reactive to light.  Cardiovascular:     Rate and Rhythm: Normal rate and regular rhythm.     Pulses: Normal pulses.     Heart sounds: Normal heart sounds.  Pulmonary:     Effort: Pulmonary effort is normal.     Breath sounds: Normal breath sounds.   Chest:     Comments: Patient declined exam. Abdominal:     General: Bowel sounds are normal.     Palpations: Abdomen is soft.  Genitourinary:    Comments: Patient declined exam. Musculoskeletal:        General: Normal range of motion.     Cervical back: Normal range of motion and neck supple.     Comments: Right wrist soft, movable, non-tender cyst. No evidence of erythema or drainage.  Skin:    General: Skin is warm and dry.     Capillary Refill: Capillary refill takes less than 2 seconds.  Neurological:     General: No focal deficit present.     Mental Status: She is alert and oriented to person, place, and time.  Psychiatric:        Mood and Affect: Mood normal.        Behavior: Behavior normal.   ASSESSMENT AND PLAN: 1. Annual physical exam: - Counseled on 150 minutes of exercise per week as tolerated, healthy eating (including decreased daily intake of saturated fats, cholesterol, added sugars, sodium), STI prevention, and routine healthcare maintenance.  2. Screening for metabolic disorder: - NLG92+JJHE to check kidney function, liver function, and electrolyte balance.  - CMP14+EGFR  3. Screening for deficiency anemia: - CBC to screen for anemia. - CBC  4. Diabetes mellitus screening: - Hemoglobin A1c to screen for pre-diabetes/diabetes. - Hemoglobin A1c  5. Screening cholesterol level: - Lipid panel to screen for high cholesterol.  - Lipid panel  6. Thyroid disorder screen: - TSH to check thyroid function.  - TSH  7. Insomnia, unspecified type: - Begin Trazodone as prescribed. Counseled patient to not consume if operating heavy machinery or driving. Counseled patient to not consume with alcohol or illicit substances. Patient verbalized understanding.  - Follow-up with primary provider in 4 weeks or sooner if needed.  - traZODone (DESYREL) 50 MG tablet; Take 1 tablet (50 mg total) by mouth at bedtime.  Dispense: 30 tablet; Refill: 0  8. Acute cystitis without  hematuria: - Urinalysis today in office suggestive of urinary tract infection. Begin Amoxicillin-Clavulanate as prescribed.  - Follow-up with primary provider as scheduled.  - POCT URINALYSIS DIP (CLINITEK) - amoxicillin-clavulanate (AUGMENTIN) 875-125 MG tablet; Take 1 tablet by mouth 2 (two) times daily for 14 days.  Dispense: 28 tablet; Refill: 0  9. Vaginal discharge: - Cervicovaginal self-swab to screen for chlamydia, gonorrhea, trichomonas, bacterial vaginitis, and candida vaginitis. - Cervicovaginal ancillary only  10. Recurrent candidiasis of vagina: - Patient concern for recurrent vaginal yeast and bacterial infections. History of  treatment with oral medications and vaginal creams with recurrent symptoms.  - Referral to Gynecology for further evaluation and management.   - Ambulatory referral to Gynecology  11. Ganglion cyst of wrist, right: - Present for at least 1 year and non-painful. Does not inhibit any activities of daily living. - Referral to Orthopedic Surgery for further evaluation and management.  - Ambulatory referral to Orthopedic Surgery    Patient was given the opportunity to ask questions.  Patient verbalized understanding of the plan and was able to repeat key elements of the plan. Patient was given clear instructions to go to Emergency Department or return to medical center if symptoms don't improve, worsen, or new problems develop.The patient verbalized understanding.   Orders Placed This Encounter  Procedures   CBC   Lipid panel   TSH   Hemoglobin A1c   CMP14+EGFR   Ambulatory referral to Orthopedic Surgery   Ambulatory referral to Gynecology   POCT URINALYSIS DIP (CLINITEK)     Requested Prescriptions   Signed Prescriptions Disp Refills   traZODone (DESYREL) 50 MG tablet 30 tablet 0    Sig: Take 1 tablet (50 mg total) by mouth at bedtime.   amoxicillin-clavulanate (AUGMENTIN) 875-125 MG tablet 28 tablet 0    Sig: Take 1 tablet by mouth 2 (two)  times daily for 14 days.    Return in about 4 weeks (around 07/09/2021) for Follow-Up or next available insomnia .  [name], NP

## 2021-06-11 ENCOUNTER — Other Ambulatory Visit: Payer: Self-pay

## 2021-06-11 ENCOUNTER — Ambulatory Visit (INDEPENDENT_AMBULATORY_CARE_PROVIDER_SITE_OTHER): Payer: BC Managed Care – PPO | Admitting: Family

## 2021-06-11 ENCOUNTER — Other Ambulatory Visit (HOSPITAL_COMMUNITY)
Admission: RE | Admit: 2021-06-11 | Discharge: 2021-06-11 | Disposition: A | Discharge status: Home / Self Care | Payer: BC Managed Care – PPO | Source: Ambulatory Visit | Attending: Family | Admitting: Family

## 2021-06-11 ENCOUNTER — Encounter: Payer: Self-pay | Admitting: Family

## 2021-06-11 VITALS — BP 95/62 | HR 104 | Temp 98.3°F | Resp 18 | Ht 67.99 in | Wt 161.2 lb

## 2021-06-11 DIAGNOSIS — B3731 Acute candidiasis of vulva and vagina: Secondary | ICD-10-CM

## 2021-06-11 DIAGNOSIS — Z1329 Encounter for screening for other suspected endocrine disorder: Secondary | ICD-10-CM | POA: Diagnosis not present

## 2021-06-11 DIAGNOSIS — N898 Other specified noninflammatory disorders of vagina: Secondary | ICD-10-CM

## 2021-06-11 DIAGNOSIS — Z13228 Encounter for screening for other metabolic disorders: Secondary | ICD-10-CM | POA: Diagnosis not present

## 2021-06-11 DIAGNOSIS — Z131 Encounter for screening for diabetes mellitus: Secondary | ICD-10-CM | POA: Diagnosis not present

## 2021-06-11 DIAGNOSIS — Z130 Encounter for screening for diseases of the blood and blood-forming organs and certain disorders involving the immune mechanism: Secondary | ICD-10-CM | POA: Diagnosis not present

## 2021-06-11 DIAGNOSIS — Z0000 Encounter for general adult medical examination without abnormal findings: Secondary | ICD-10-CM

## 2021-06-11 DIAGNOSIS — M67431 Ganglion, right wrist: Secondary | ICD-10-CM

## 2021-06-11 DIAGNOSIS — N3000 Acute cystitis without hematuria: Secondary | ICD-10-CM

## 2021-06-11 DIAGNOSIS — G4700 Insomnia, unspecified: Secondary | ICD-10-CM | POA: Insufficient documentation

## 2021-06-11 DIAGNOSIS — B373 Candidiasis of vulva and vagina: Secondary | ICD-10-CM

## 2021-06-11 DIAGNOSIS — Z13220 Encounter for screening for lipoid disorders: Secondary | ICD-10-CM | POA: Diagnosis not present

## 2021-06-11 LAB — POCT URINALYSIS DIP (CLINITEK)
Bilirubin, UA: NEGATIVE
Glucose, UA: NEGATIVE mg/dL
Ketones, POC UA: NEGATIVE mg/dL
Leukocytes, UA: NEGATIVE
Nitrite, UA: POSITIVE — AB
POC PROTEIN,UA: NEGATIVE
Spec Grav, UA: >=1.03 — AB (ref 1.010–1.025)
Urobilinogen, UA: 1 E.U./dL
pH, UA: 6 (ref 5.0–8.0)

## 2021-06-11 MED ORDER — AMOXICILLIN-POT CLAVULANATE 875-125 MG PO TABS: 1.0000 | ORAL_TABLET | Freq: Two times a day (BID) | ORAL | 0 refills | Status: AC

## 2021-06-11 MED ORDER — TRAZODONE HCL 50 MG PO TABS: 50.0000 mg | ORAL_TABLET | Freq: Every day | ORAL | 0 refills | Status: DC

## 2021-06-11 NOTE — Progress Notes (Signed)
Pt presents for annual physical exam, pt reports some urinary frequency and vaginal discharge w/mild itching

## 2021-06-11 NOTE — Patient Instructions (Signed)
Preventive Care 40-40 Years Old, Female Preventive care refers to lifestyle choices and visits with your health care provider that can promote health and wellness. This includes: A yearly physical exam. This is also called an annual wellness visit. Regular dental and eye exams. Immunizations. Screening for certain conditions. Healthy lifestyle choices, such as: Eating a healthy diet. Getting regular exercise. Not using drugs or products that contain nicotine and tobacco. Limiting alcohol use. What can I expect for my preventive care visit? Physical exam Your health care provider will check your: Height and weight. These may be used to calculate your BMI (body mass index). BMI is a measurement that tells if you are at a healthy weight. Heart rate and blood pressure. Body temperature. Skin for abnormal spots. Counseling Your health care provider may ask you questions about your: Past medical problems. Family's medical history. Alcohol, tobacco, and drug use. Emotional well-being. Home life and relationship well-being. Sexual activity. Diet, exercise, and sleep habits. Work and work Statistician. Access to firearms. Method of birth control. Menstrual cycle. Pregnancy history. What immunizations do I need?  Vaccines are usually given at various ages, according to a schedule. Your health care provider will recommend vaccines for you based on your age, medicalhistory, and lifestyle or other factors, such as travel or where you work. What tests do I need? Blood tests Lipid and cholesterol levels. These may be checked every 5 years, or more often if you are over 40 years old. Hepatitis C test. Hepatitis B test. Screening Lung cancer screening. You may have this screening every year starting at age 30 if you have a 30-pack-year history of smoking and currently smoke or have quit within the past 15 years. Colorectal cancer screening. All adults should have this screening starting at  age 23 and continuing until age 3. Your health care provider may recommend screening at age 40 if you are at increased risk. You will have tests every 1-10 years, depending on your results and the type of screening test. Diabetes screening. This is done by checking your blood sugar (glucose) after you have not eaten for a while (fasting). You may have this done every 1-3 years. Mammogram. This may be done every 1-2 years. Talk with your health care provider about when you should start having regular mammograms. This may depend on whether you have a family history of breast cancer. BRCA-related cancer screening. This may be done if you have a family history of breast, ovarian, tubal, or peritoneal cancers. Pelvic exam and Pap test. This may be done every 3 years starting at age 40. Starting at age 54, this may be done every 5 years if you have a Pap test in combination with an HPV test. Other tests STD (sexually transmitted disease) testing, if you are at risk. Bone density scan. This is done to screen for osteoporosis. You may have this scan if you are at high risk for osteoporosis. Talk with your health care provider about your test results, treatment options,and if necessary, the need for more tests. Follow these instructions at home: Eating and drinking  Eat a diet that includes fresh fruits and vegetables, whole grains, lean protein, and low-fat dairy products. Take vitamin and mineral supplements as recommended by your health care provider. Do not drink alcohol if: Your health care provider tells you not to drink. You are pregnant, may be pregnant, or are planning to become pregnant. If you drink alcohol: Limit how much you have to 0-1 drink a day. Be aware  of how much alcohol is in your drink. In the [place] one drink equals one 12 oz bottle of beer (355 mL), one 5 oz glass of wine (148 mL), or one 1 oz glass of hard liquor (44 mL).  Lifestyle Take daily care of your teeth and  gums. Brush your teeth every morning and night with fluoride toothpaste. Floss one time each day. Stay active. Exercise for at least 30 minutes 5 or more days each week. Do not use any products that contain nicotine or tobacco, such as cigarettes, e-cigarettes, and chewing tobacco. If you need help quitting, ask your health care provider. Do not use drugs. If you are sexually active, practice safe sex. Use a condom or other form of protection to prevent STIs (sexually transmitted infections). If you do not wish to become pregnant, use a form of birth control. If you plan to become pregnant, see your health care provider for a prepregnancy visit. If told by your health care provider, take low-dose aspirin daily starting at age 40. Find healthy ways to cope with stress, such as: Meditation, yoga, or listening to music. Journaling. Talking to a trusted person. Spending time with friends and family. Safety Always wear your seat belt while driving or riding in a vehicle. Do not drive: If you have been drinking alcohol. Do not ride with someone who has been drinking. When you are tired or distracted. While texting. Wear a helmet and other protective equipment during sports activities. If you have firearms in your house, make sure you follow all gun safety procedures. What's next? Visit your health care provider once a year for an annual wellness visit. Ask your health care provider how often you should have your eyes and teeth checked. Stay up to date on all vaccines. This information is not intended to replace advice given to you by your health care provider. Make sure you discuss any questions you have with your healthcare provider. Document Revised: 07/16/2020 Document Reviewed: 06/23/2018 Elsevier Patient Education  2022 [employer].

## 2021-06-12 LAB — LIPID PANEL
Chol/HDL Ratio: 4 ratio (ref 0.0–4.4)
Cholesterol, Total: 187 mg/dL (ref 100–199)
HDL: 47 mg/dL (ref >39)
LDL Chol Calc (NIH): 99 mg/dL (ref 0–99)
Triglycerides: 244 mg/dL — ABNORMAL HIGH (ref 0–149)
VLDL Cholesterol Cal: 41 mg/dL — ABNORMAL HIGH (ref 5–40)

## 2021-06-12 LAB — CERVICOVAGINAL ANCILLARY ONLY
Bacterial Vaginitis (gardnerella): NEGATIVE
Candida Glabrata: NEGATIVE
Candida Vaginitis: NEGATIVE
Chlamydia: NEGATIVE
Comment: NEGATIVE
Comment: NEGATIVE
Comment: NEGATIVE
Comment: NEGATIVE
Comment: NEGATIVE
Comment: NORMAL
Neisseria Gonorrhea: NEGATIVE
Trichomonas: NEGATIVE

## 2021-06-12 LAB — CBC
Hematocrit: 38.9 % (ref 34.0–46.6)
Hemoglobin: 12.9 g/dL (ref 11.1–15.9)
MCH: 30.9 pg (ref 26.6–33.0)
MCHC: 33.2 g/dL (ref 31.5–35.7)
MCV: 93 fL (ref 79–97)
Platelets: 225 10*3/uL (ref 150–450)
RBC: 4.17 x10E6/uL (ref 3.77–5.28)
RDW: 11.9 % (ref 11.7–15.4)
WBC: 7.1 10*3/uL (ref 3.4–10.8)

## 2021-06-12 LAB — CMP14+EGFR
ALT: 8 IU/L (ref 0–32)
AST: 12 IU/L (ref 0–40)
Albumin/Globulin Ratio: 2 (ref 1.2–2.2)
Albumin: 4.4 g/dL (ref 3.8–4.8)
Alkaline Phosphatase: 61 IU/L (ref 44–121)
BUN/Creatinine Ratio: 11 (ref 9–23)
BUN: 8 mg/dL (ref 6–24)
Bilirubin Total: 0.2 mg/dL (ref 0.0–1.2)
CO2: 22 mmol/L (ref 20–29)
Calcium: 8.8 mg/dL (ref 8.7–10.2)
Chloride: 106 mmol/L (ref 96–106)
Creatinine, Ser: 0.71 mg/dL (ref 0.57–1.00)
Globulin, Total: 2.2 g/dL (ref 1.5–4.5)
Glucose: 85 mg/dL (ref 65–99)
Potassium: 3.7 mmol/L (ref 3.5–5.2)
Sodium: 139 mmol/L (ref 134–144)
Total Protein: 6.6 g/dL (ref 6.0–8.5)
eGFR: 110 mL/min/{1.73_m2} (ref >59)

## 2021-06-12 LAB — HEMOGLOBIN A1C
Est. average glucose Bld gHb Est-mCnc: 97 mg/dL
Hgb A1c MFr Bld: 5 % (ref 4.8–5.6)

## 2021-06-12 LAB — TSH: TSH: 0.974 u[IU]/mL (ref 0.450–4.500)

## 2021-06-12 NOTE — Progress Notes (Signed)
Gonorrhea, Chlamydia, Trichomonas, Bacterial Vaginitis, and Candida Vaginitis (sometimes called yeast infection) negative.

## 2021-06-12 NOTE — Progress Notes (Signed)
Kidney function normal.   Liver function normal.   Thyroid function normal.   No diabetes.   No anemia.   Urinalysis discussed in office.  Cholesterol higher than expected. High cholesterol may increase risk of heart attack and/or stroke. Consider eating more fruits, vegetables, and lean baked meats such as chicken or fish. Moderate intensity exercise at least 150 minutes as tolerated per week may help as well. However, your risk of heart attack/stroke in ten years is low so does not need to start a medication at the moment. Encouraged to recheck in 3 to 6 months.   The following is for provider reference only: The 10-year ASCVD risk score is: 0.3%   Values used to calculate the score:     Age: 4 years     Sex: Female     Is Non-Hispanic African American: Yes     Diabetic: No     Tobacco smoker: Yes     Systolic Blood Pressure: 95 mmHg     Is BP treated: No     HDL Cholesterol: 47 mg/dL     Total Cholesterol: 187 mg/dL

## 2021-06-25 ENCOUNTER — Ambulatory Visit: Payer: BC Managed Care – PPO | Admitting: Orthopedic Surgery

## 2021-06-25 ENCOUNTER — Ambulatory Visit: Payer: Self-pay

## 2021-06-25 ENCOUNTER — Encounter: Payer: Self-pay | Admitting: Orthopedic Surgery

## 2021-06-25 ENCOUNTER — Other Ambulatory Visit: Payer: Self-pay

## 2021-06-25 DIAGNOSIS — R2231 Localized swelling, mass and lump, right upper limb: Secondary | ICD-10-CM

## 2021-06-25 DIAGNOSIS — M25531 Pain in right wrist: Secondary | ICD-10-CM

## 2021-06-25 NOTE — Progress Notes (Signed)
Office Visit Note   Patient: [name]           Date of Birth: 1981-09-23           MRN: [id number] Visit Date: 06/25/2021 Requested by: [name], NP [address] [place],  [place] [address] PCP: Nicolette Bang, MD  Subjective: Chief Complaint  Patient presents with   Right Wrist - New Patient (Initial Visit)    HPI: Patient presents with long history of right radial volar wrist mass.  Has been present for about 12 years but has increased in size over the past year.  Is not painful.  She is right-hand dominant.  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  Would like to have the mass removed.  Denies any radicular symptoms or significant pain.              ROS: All systems reviewed are negative as they relate to the chief complaint within the history of present illness.  Patient denies  fevers or chills.   Assessment & Plan: Visit Diagnoses:  1. Pain in right wrist   2. Mass of right forearm     Plan: Impression is right forearm mass.  Ultrasound examination demonstrates well-circumscribed mass which does not have tissue density consistent with lipoma or ganglion cyst.  Appears well-circumscribed but requires further imaging prior to removal.  Is volar to the radial artery.  Plan MRI with contrast to evaluate this mass with return office visit thereafter.  Follow-Up Instructions: Return for after MRI.   Orders:  Orders Placed This Encounter  Procedures   XR Forearm Right   MR FOREARM RIGHT W WO CONTRAST   No orders of the defined types were placed in this encounter.     Procedures: No procedures performed   Clinical Data: No additional findings.  Objective: Vital Signs: There were no vitals taken for this visit.  Physical Exam:   Constitutional: Patient appears well-developed HEENT:  Head: Normocephalic Eyes:EOM are normal Neck: Normal range of motion Cardiovascular: Normal rate Pulmonary/chest: Effort  normal Neurologic: Patient is alert Skin: Skin is warm Psychiatric: Patient has normal mood and affect   Ortho Exam: Ortho exam demonstrates good cervical spine range of motion.  Patient has 5 out of 5 grip EPL FPL interosseous wrist flexion wrist extension bicep triceps and deltoid strength.  Has a 7 x 4 cm mass proximal to the wrist flexion crease centered over the FCR tendon and radial artery.  Nontender to palpation.  Is not fixed to underlying tissue.  Hand is perfused and sensate.  Specialty Comments:  No specialty comments available.  Imaging: XR Forearm Right  Result Date: 06/25/2021 AP lateral radiographs right forearm reviewed.  No fracture.  Bony structure and architecture normal.  No soft tissue calcifications.  Visualized wrist without radiographic abnormality    PMFS History: Patient Active Problem List   Diagnosis Date Noted   Insomnia 06/11/2021   Acute right-sided low back pain with right-sided sciatica 12/04/2020   Anemia 01/29/2020   Past Medical History:  Diagnosis Date   Abnormal Pap smear    Chlamydia    Gonorrhea    Headache(784.0)    HPV (human papilloma virus) infection    Trichomonas    Urinary tract infection     Family History  Problem Relation Age of Onset   Hypertension Mother    Diabetes Mother    Stroke Mother     Past Surgical History:  Procedure Laterality Date   CESAREAN SECTION  08/31/2011   Procedure: CESAREAN SECTION;  Surgeon: Agnes Lawrence, MD;  Location: [facility];  Service: Gynecology;  Laterality: N/A;  Primary Cesarean section Baby Boy @ 1418   COLPOSCOPY  2010   LEEP     Social History   Occupational History   Not on file  Tobacco Use   Smoking status: Every Day    Packs/day: 0.25    Years: 5.00    Pack years: 1.25    Types: Cigarettes   Smokeless tobacco: Never   Tobacco comments:    quit with + preg  Vaping Use   Vaping Use: Never used  Substance and Sexual Activity   Alcohol use: Yes    Alcohol/week:  1.0 standard drink    Types: 1 Standard drinks or equivalent per week    Comment: socially   Drug use: No   Sexual activity: Not on file    Comment: states no more (wk ago was last)- caught him with someone else

## 2021-07-12 ENCOUNTER — Ambulatory Visit
Admission: RE | Admit: 2021-07-12 | Discharge: 2021-07-12 | Disposition: A | Discharge status: Home / Self Care | Payer: BC Managed Care – PPO | Source: Ambulatory Visit | Attending: Orthopedic Surgery | Admitting: Orthopedic Surgery

## 2021-07-12 DIAGNOSIS — R2231 Localized swelling, mass and lump, right upper limb: Secondary | ICD-10-CM | POA: Diagnosis not present

## 2021-07-12 MED ORDER — GADOBENATE DIMEGLUMINE 529 MG/ML IV SOLN
15.0000 mL | Freq: Once | INTRAVENOUS | Status: AC | PRN
  Administered 2021-07-12: 15 mL via INTRAVENOUS

## 2021-07-22 NOTE — Progress Notes (Signed)
Hi lauren I just called her can you call her also and have her see charlie for mass removal thx

## 2021-07-24 ENCOUNTER — Other Ambulatory Visit: Payer: Self-pay

## 2021-07-24 ENCOUNTER — Ambulatory Visit: Payer: BC Managed Care – PPO | Admitting: Orthopedic Surgery

## 2021-07-24 ENCOUNTER — Encounter: Payer: Self-pay | Admitting: Orthopedic Surgery

## 2021-07-24 DIAGNOSIS — D1721 Benign lipomatous neoplasm of skin and subcutaneous tissue of right arm: Secondary | ICD-10-CM | POA: Diagnosis not present

## 2021-07-24 DIAGNOSIS — D1720 Benign lipomatous neoplasm of skin and subcutaneous tissue of unspecified limb: Secondary | ICD-10-CM | POA: Insufficient documentation

## 2021-07-24 NOTE — Progress Notes (Signed)
Office Visit Note   Patient: [name]           Date of Birth: May 14, 1981           MRN: [id number] Visit Date: 07/24/2021              Requested by: Nicolette Bang, MD [address] [place],  [place] [address] PCP: Nicolette Bang, MD   Assessment & Plan: Visit Diagnoses:  1. Lipoma of right forearm     Plan: We discussed the diagnosis, prognosis, and both conservative and operative treatment options for her forearm lipoma.  After our discussion, the patient has elected to proceed with surgical excision.  We reviewed the benefits of surgery and the potential risks including, but not limited to, persistent symptoms, infection, damage to nearby nerves and blood vessels, delayed wound healing, recurrence of the mass.    All patient concerns and questions were addressed.  A surgical date will be confirmed with the patient.    Follow-Up Instructions: No follow-ups on file.   Orders:  No orders of the defined types were placed in this encounter.  No orders of the defined types were placed in this encounter.     Procedures: No procedures performed   Clinical Data: No additional findings.   Subjective: Chief Complaint  Patient presents with   Right Forearm - Follow-up    This is a 40 y o RHD F who presents w/ a mass at the volar and radial aspect of her distal forearm.  The mass has been present for years.  She thinks it may have grown over the last year or two.  It is non painful.  She denies any associated numbness or tingling in this extremity.  She denies any other masses.     Review of Systems  Constitutional: Negative.   Respiratory: Negative.    Cardiovascular: Negative.   Skin: Negative.   Neurological: Negative.     Objective: Vital Signs: BP 111/73 (BP Location: Left Arm, Patient Position: Sitting, Cuff Size: Normal)   Pulse 75   Ht 5\' 8"  (1.727 m)   Wt 169 lb 9.6 oz (76.9 kg)   SpO2 98%   BMI 25.79 kg/m    Physical Exam Cardiovascular:     Rate and Rhythm: Normal rate.     Pulses: Normal pulses.  Pulmonary:     Effort: Pulmonary effort is normal.  Skin:    General: Skin is warm and dry.     Capillary Refill: Capillary refill takes less than 2 seconds.  Neurological:     Mental Status: She is alert.    Right Hand Exam   Tenderness  The patient is experiencing no tenderness.   Range of Motion  The patient has normal right wrist ROM.   Muscle Strength  The patient has normal right wrist strength.  Other  Erythema: absent Sensation: normal Pulse: present  Comments:  Approx 3x6 cm mass at volar and radial aspect of distal forearm.  Feels soft, well circumscribed, and subcutaneous. Palpable radial pulse distat to mass.       Specialty Comments:  No specialty comments available.  Imaging: Recent MRI of the R forearm reviewed by me.  It demonstrates a 3x6 cm mass that is isointense to fat on all sequences consistent with a lipoma.  There is no internal septations or heterogeneity.    PMFS History: Patient Active Problem List   Diagnosis Date Noted   Lipoma of right forearm  07/24/2021   Insomnia 06/11/2021   Acute right-sided low back pain with right-sided sciatica 12/04/2020   Anemia 01/29/2020   Past Medical History:  Diagnosis Date   Abnormal Pap smear    Chlamydia    Gonorrhea    Headache(784.0)    HPV (human papilloma virus) infection    Trichomonas    Urinary tract infection     Family History  Problem Relation Age of Onset   Hypertension Mother    Diabetes Mother    Stroke Mother     Past Surgical History:  Procedure Laterality Date   CESAREAN SECTION  08/31/2011   Procedure: CESAREAN SECTION;  Surgeon: Agnes Lawrence, MD;  Location: [facility];  Service: Gynecology;  Laterality: N/A;  Primary Cesarean section Baby Boy @ 1418   COLPOSCOPY  2010   LEEP     Social History   Occupational History   Not on file  Tobacco Use   Smoking status:  Every Day    Packs/day: 0.25    Years: 5.00    Pack years: 1.25    Types: Cigarettes   Smokeless tobacco: Never   Tobacco comments:    quit with + preg  Vaping Use   Vaping Use: Never used  Substance and Sexual Activity   Alcohol use: Yes    Alcohol/week: 1.0 standard drink    Types: 1 Standard drinks or equivalent per week    Comment: socially   Drug use: No   Sexual activity: Not on file    Comment: states no more (wk ago was last)- caught him with someone else

## 2021-07-24 NOTE — H&P (View-Only) (Signed)
Office Visit Note   Patient: [name]           Date of Birth: 1981-01-11           MRN: [id number] Visit Date: 07/24/2021              Requested by: Nicolette Bang, MD [address] [place],  [place] [address] PCP: Nicolette Bang, MD   Assessment & Plan: Visit Diagnoses:  1. Lipoma of right forearm     Plan: We discussed the diagnosis, prognosis, and both conservative and operative treatment options for her forearm lipoma.  After our discussion, the patient has elected to proceed with surgical excision.  We reviewed the benefits of surgery and the potential risks including, but not limited to, persistent symptoms, infection, damage to nearby nerves and blood vessels, delayed wound healing, recurrence of the mass.    All patient concerns and questions were addressed.  A surgical date will be confirmed with the patient.    Follow-Up Instructions: No follow-ups on file.   Orders:  No orders of the defined types were placed in this encounter.  No orders of the defined types were placed in this encounter.     Procedures: No procedures performed   Clinical Data: No additional findings.   Subjective: Chief Complaint  Patient presents with   Right Forearm - Follow-up    This is a 40 y o RHD F who presents w/ a mass at the volar and radial aspect of her distal forearm.  The mass has been present for years.  She thinks it may have grown over the last year or two.  It is non painful.  She denies any associated numbness or tingling in this extremity.  She denies any other masses.     Review of Systems  Constitutional: Negative.   Respiratory: Negative.    Cardiovascular: Negative.   Skin: Negative.   Neurological: Negative.     Objective: Vital Signs: BP 111/73 (BP Location: Left Arm, Patient Position: Sitting, Cuff Size: Normal)   Pulse 75   Ht 5\' 8"  (1.727 m)   Wt 169 lb 9.6 oz (76.9 kg)   SpO2 98%   BMI 25.79 kg/m    Physical Exam Cardiovascular:     Rate and Rhythm: Normal rate.     Pulses: Normal pulses.  Pulmonary:     Effort: Pulmonary effort is normal.  Skin:    General: Skin is warm and dry.     Capillary Refill: Capillary refill takes less than 2 seconds.  Neurological:     Mental Status: She is alert.    Right Hand Exam   Tenderness  The patient is experiencing no tenderness.   Range of Motion  The patient has normal right wrist ROM.   Muscle Strength  The patient has normal right wrist strength.  Other  Erythema: absent Sensation: normal Pulse: present  Comments:  Approx 3x6 cm mass at volar and radial aspect of distal forearm.  Feels soft, well circumscribed, and subcutaneous. Palpable radial pulse distat to mass.       Specialty Comments:  No specialty comments available.  Imaging: Recent MRI of the R forearm reviewed by me.  It demonstrates a 3x6 cm mass that is isointense to fat on all sequences consistent with a lipoma.  There is no internal septations or heterogeneity.    PMFS History: Patient Active Problem List   Diagnosis Date Noted   Lipoma of right forearm  07/24/2021   Insomnia 06/11/2021   Acute right-sided low back pain with right-sided sciatica 12/04/2020   Anemia 01/29/2020   Past Medical History:  Diagnosis Date   Abnormal Pap smear    Chlamydia    Gonorrhea    Headache(784.0)    HPV (human papilloma virus) infection    Trichomonas    Urinary tract infection     Family History  Problem Relation Age of Onset   Hypertension Mother    Diabetes Mother    Stroke Mother     Past Surgical History:  Procedure Laterality Date   CESAREAN SECTION  08/31/2011   Procedure: CESAREAN SECTION;  Surgeon: Agnes Lawrence, MD;  Location: [facility];  Service: Gynecology;  Laterality: N/A;  Primary Cesarean section Baby Boy @ 1418   COLPOSCOPY  2010   LEEP     Social History   Occupational History   Not on file  Tobacco Use   Smoking status:  Every Day    Packs/day: 0.25    Years: 5.00    Pack years: 1.25    Types: Cigarettes   Smokeless tobacco: Never   Tobacco comments:    quit with + preg  Vaping Use   Vaping Use: Never used  Substance and Sexual Activity   Alcohol use: Yes    Alcohol/week: 1.0 standard drink    Types: 1 Standard drinks or equivalent per week    Comment: socially   Drug use: No   Sexual activity: Not on file    Comment: states no more (wk ago was last)- caught him with someone else

## 2021-07-30 ENCOUNTER — Ambulatory Visit: Payer: BC Managed Care – PPO | Admitting: Orthopedic Surgery

## 2021-07-31 ENCOUNTER — Encounter (HOSPITAL_BASED_OUTPATIENT_CLINIC_OR_DEPARTMENT_OTHER): Payer: Self-pay | Admitting: Orthopedic Surgery

## 2021-07-31 ENCOUNTER — Other Ambulatory Visit: Payer: Self-pay

## 2021-08-04 ENCOUNTER — Telehealth: Payer: Self-pay

## 2021-08-04 NOTE — Telephone Encounter (Signed)
Ivin Booty from [facility] was calling stating that she does not have authorization for patient's upcoming surgery with Dr. Tempie Donning.

## 2021-08-08 ENCOUNTER — Ambulatory Visit (HOSPITAL_BASED_OUTPATIENT_CLINIC_OR_DEPARTMENT_OTHER): Payer: BC Managed Care – PPO | Admitting: Anesthesiology

## 2021-08-08 ENCOUNTER — Encounter (HOSPITAL_BASED_OUTPATIENT_CLINIC_OR_DEPARTMENT_OTHER)
Admission: RE | Disposition: A | Discharge status: Home / Self Care | Payer: Self-pay | Source: Home / Self Care | Attending: Orthopedic Surgery

## 2021-08-08 ENCOUNTER — Other Ambulatory Visit: Payer: Self-pay

## 2021-08-08 ENCOUNTER — Ambulatory Visit (HOSPITAL_BASED_OUTPATIENT_CLINIC_OR_DEPARTMENT_OTHER)
Admission: RE | Admit: 2021-08-08 | Discharge: 2021-08-08 | Disposition: A | Discharge status: Home / Self Care | Payer: BC Managed Care – PPO | Attending: Orthopedic Surgery | Admitting: Orthopedic Surgery

## 2021-08-08 ENCOUNTER — Encounter (HOSPITAL_BASED_OUTPATIENT_CLINIC_OR_DEPARTMENT_OTHER): Payer: Self-pay | Admitting: Orthopedic Surgery

## 2021-08-08 DIAGNOSIS — F17210 Nicotine dependence, cigarettes, uncomplicated: Secondary | ICD-10-CM | POA: Insufficient documentation

## 2021-08-08 DIAGNOSIS — D1720 Benign lipomatous neoplasm of skin and subcutaneous tissue of unspecified limb: Secondary | ICD-10-CM

## 2021-08-08 DIAGNOSIS — D1721 Benign lipomatous neoplasm of skin and subcutaneous tissue of right arm: Secondary | ICD-10-CM | POA: Diagnosis not present

## 2021-08-08 DIAGNOSIS — D649 Anemia, unspecified: Secondary | ICD-10-CM | POA: Diagnosis not present

## 2021-08-08 HISTORY — DX: Anemia, unspecified: D64.9

## 2021-08-08 HISTORY — PX: LIPOMA EXCISION: SHX5283

## 2021-08-08 LAB — POCT PREGNANCY, URINE: Preg Test, Ur: NEGATIVE

## 2021-08-08 SURGERY — EXCISION LIPOMA
Anesthesia: General | Site: Arm Lower | Laterality: Right

## 2021-08-08 MED ORDER — DEXAMETHASONE SODIUM PHOSPHATE 10 MG/ML IJ SOLN
INTRAMUSCULAR | Status: AC
  Filled 2021-08-08: qty 1

## 2021-08-08 MED ORDER — PROPOFOL 10 MG/ML IV BOLUS
INTRAVENOUS | Status: DC | PRN
  Administered 2021-08-08: 200 mg via INTRAVENOUS

## 2021-08-08 MED ORDER — FENTANYL CITRATE (PF) 100 MCG/2ML IJ SOLN
INTRAMUSCULAR | Status: AC
  Filled 2021-08-08: qty 2

## 2021-08-08 MED ORDER — 0.9 % SODIUM CHLORIDE (POUR BTL) OPTIME
TOPICAL | Status: DC | PRN
  Administered 2021-08-08: 180 mL

## 2021-08-08 MED ORDER — PROPOFOL 10 MG/ML IV BOLUS
INTRAVENOUS | Status: AC
  Filled 2021-08-08: qty 20

## 2021-08-08 MED ORDER — DEXAMETHASONE SODIUM PHOSPHATE 10 MG/ML IJ SOLN
INTRAMUSCULAR | Status: DC | PRN
  Administered 2021-08-08: 5 mg via INTRAVENOUS

## 2021-08-08 MED ORDER — OXYCODONE HCL 5 MG PO TABS: 5.0000 mg | ORAL_TABLET | Freq: Four times a day (QID) | ORAL | 0 refills | Status: AC | PRN

## 2021-08-08 MED ORDER — ONDANSETRON HCL 4 MG/2ML IJ SOLN
INTRAMUSCULAR | Status: AC
  Filled 2021-08-08: qty 2

## 2021-08-08 MED ORDER — CEFAZOLIN SODIUM-DEXTROSE 2-4 GM/100ML-% IV SOLN
2.0000 g | INTRAVENOUS | Status: AC
  Administered 2021-08-08: 2 g via INTRAVENOUS

## 2021-08-08 MED ORDER — FENTANYL CITRATE (PF) 100 MCG/2ML IJ SOLN
INTRAMUSCULAR | Status: DC | PRN
  Administered 2021-08-08 (×4): 25 ug via INTRAVENOUS

## 2021-08-08 MED ORDER — MIDAZOLAM HCL 2 MG/2ML IJ SOLN
INTRAMUSCULAR | Status: AC
  Filled 2021-08-08: qty 2

## 2021-08-08 MED ORDER — LACTATED RINGERS IV SOLN: INTRAVENOUS | Status: DC

## 2021-08-08 MED ORDER — BUPIVACAINE HCL (PF) 0.25 % IJ SOLN
INTRAMUSCULAR | Status: DC | PRN
  Administered 2021-08-08: 8 mL

## 2021-08-08 MED ORDER — CEFAZOLIN SODIUM-DEXTROSE 2-4 GM/100ML-% IV SOLN
INTRAVENOUS | Status: AC
  Filled 2021-08-08: qty 100

## 2021-08-08 MED ORDER — LIDOCAINE HCL (CARDIAC) PF 100 MG/5ML IV SOSY
PREFILLED_SYRINGE | INTRAVENOUS | Status: DC | PRN
  Administered 2021-08-08: 60 mg via INTRAVENOUS

## 2021-08-08 MED ORDER — MIDAZOLAM HCL 5 MG/5ML IJ SOLN
INTRAMUSCULAR | Status: DC | PRN
  Administered 2021-08-08: 2 mg via INTRAVENOUS

## 2021-08-08 MED ORDER — ONDANSETRON HCL 4 MG/2ML IJ SOLN
INTRAMUSCULAR | Status: DC | PRN
  Administered 2021-08-08: 4 mg via INTRAVENOUS

## 2021-08-08 MED ORDER — FENTANYL CITRATE (PF) 100 MCG/2ML IJ SOLN: 25.0000 ug | INTRAMUSCULAR | Status: DC | PRN

## 2021-08-08 SURGICAL SUPPLY — 45 items
APL PRP STRL LF DISP 70% ISPRP (MISCELLANEOUS) ×1
BLADE SURG 15 STRL LF DISP TIS (BLADE) ×1 IMPLANT
BLADE SURG 15 STRL SS (BLADE) ×2
BNDG CMPR 9X4 STRL LF SNTH (GAUZE/BANDAGES/DRESSINGS) ×1
BNDG ELASTIC 3X5.8 VLCR STR LF (GAUZE/BANDAGES/DRESSINGS) ×2 IMPLANT
BNDG ESMARK 4X9 LF (GAUZE/BANDAGES/DRESSINGS) ×2 IMPLANT
BNDG GAUZE ELAST 4 BULKY (GAUZE/BANDAGES/DRESSINGS) ×2 IMPLANT
BNDG PLASTER X FAST 3X3 WHT LF (CAST SUPPLIES) ×10 IMPLANT
BNDG PLSTR 9X3 FST ST WHT (CAST SUPPLIES)
CHLORAPREP W/TINT 26 (MISCELLANEOUS) ×2 IMPLANT
CORD BIPOLAR FORCEPS 12FT (ELECTRODE) ×2 IMPLANT
COVER BACK TABLE 60X90IN (DRAPES) ×2 IMPLANT
COVER MAYO STAND STRL (DRAPES) ×2 IMPLANT
CUFF TOURN SGL QUICK 18X4 (TOURNIQUET CUFF) IMPLANT
CUFF TOURN SGL QUICK 24 (TOURNIQUET CUFF)
CUFF TRNQT CYL 24X4X16.5-23 (TOURNIQUET CUFF) IMPLANT
DRAPE EXTREMITY T 121X128X90 (DISPOSABLE) ×2 IMPLANT
DRAPE SURG 17X23 STRL (DRAPES) ×2 IMPLANT
GAUZE SPONGE 4X4 12PLY STRL (GAUZE/BANDAGES/DRESSINGS) ×2 IMPLANT
GAUZE XEROFORM 1X8 LF (GAUZE/BANDAGES/DRESSINGS) ×1 IMPLANT
GLOVE SRG 8 PF TXTR STRL LF DI (GLOVE) IMPLANT
GLOVE SURG ENC MOIS LTX SZ7 (GLOVE) ×2 IMPLANT
GLOVE SURG UNDER POLY LF SZ7 (GLOVE) ×3 IMPLANT
GLOVE SURG UNDER POLY LF SZ8 (GLOVE) ×2
GOWN STRL REUS W/ TWL LRG LVL3 (GOWN DISPOSABLE) ×1 IMPLANT
GOWN STRL REUS W/TWL 2XL LVL3 (GOWN DISPOSABLE) ×1 IMPLANT
GOWN STRL REUS W/TWL LRG LVL3 (GOWN DISPOSABLE) ×2
GOWN STRL REUS W/TWL XL LVL3 (GOWN DISPOSABLE) ×1 IMPLANT
NDL HYPO 25X1 1.5 SAFETY (NEEDLE) IMPLANT
NEEDLE HYPO 25X1 1.5 SAFETY (NEEDLE) ×2 IMPLANT
NS IRRIG 1000ML POUR BTL (IV SOLUTION) ×2 IMPLANT
PACK BASIN DAY SURGERY FS (CUSTOM PROCEDURE TRAY) ×2 IMPLANT
PAD CAST 3X4 CTTN HI CHSV (CAST SUPPLIES) ×1 IMPLANT
PADDING CAST COTTON 3X4 STRL (CAST SUPPLIES)
SLEEVE SCD COMPRESS KNEE MED (STOCKING) IMPLANT
SUCTION FRAZIER HANDLE 12FR (TUBING)
SUCTION TUBE FRAZIER 12FR DISP (TUBING) ×1 IMPLANT
SUT ETHILON 4 0 PS 2 18 (SUTURE) ×2 IMPLANT
SUT MNCRL AB 3-0 PS2 18 (SUTURE) ×2 IMPLANT
SUT VICRYL 4-0 PS2 18IN ABS (SUTURE) IMPLANT
SYR BULB EAR ULCER 3OZ GRN STR (SYRINGE) ×2 IMPLANT
SYR CONTROL 10ML LL (SYRINGE) ×1 IMPLANT
TOWEL GREEN STERILE FF (TOWEL DISPOSABLE) ×4 IMPLANT
TUBE CONNECTING 20X1/4 (TUBING) ×1 IMPLANT
UNDERPAD 30X36 HEAVY ABSORB (UNDERPADS AND DIAPERS) ×2 IMPLANT

## 2021-08-08 NOTE — Transfer of Care (Signed)
Immediate Anesthesia Transfer of Care Note  Patient: [name]  Procedure(s) Performed: RIGHT FOREARM MASS EXCISION (Right: Arm Lower)  Patient Location: PACU  Anesthesia Type:General  Level of Consciousness: drowsy  Airway & Oxygen Therapy: Patient Spontanous Breathing and Patient connected to face mask oxygen  Post-op Assessment: Report given to RN and Post -op Vital signs reviewed and stable  Post vital signs: Reviewed and stable  Last Vitals:  Vitals Value Taken Time  BP    Temp    Pulse 93 08/08/21 1325  Resp 14 08/08/21 1325  SpO2 100 % 08/08/21 1325  Vitals shown include unvalidated device data.  Last Pain:  Vitals:   08/08/21 1049  TempSrc: Oral  PainSc: 0-No pain      Patients Stated Pain Goal: 3 (28/11/88 6773)  Complications: No notable events documented.

## 2021-08-08 NOTE — Interval H&P Note (Signed)
History and Physical Interval Note:  08/08/2021 12:08 PM  [name]  has presented today for surgery, with the diagnosis of Right Forearm Lipoma.  The various methods of treatment have been discussed with the patient and family. After consideration of risks, benefits and other options for treatment, the patient has consented to  Procedure(s): RIGHT FOREARM MASS EXCISION (Right) as a surgical intervention.  The patient's history has been reviewed, patient examined, no change in status, stable for surgery.  I have reviewed the patient's chart and labs.  Questions were answered to the patient's satisfaction.     Salvador Coupe Maloree Uplinger

## 2021-08-08 NOTE — Anesthesia Postprocedure Evaluation (Signed)
Anesthesia Post Note  Patient: [name]  Procedure(s) Performed: RIGHT FOREARM MASS EXCISION (Right: Arm Lower)     Patient location during evaluation: PACU Anesthesia Type: General Level of consciousness: awake Pain management: pain level controlled Vital Signs Assessment: post-procedure vital signs reviewed and stable Respiratory status: spontaneous breathing Cardiovascular status: stable Postop Assessment: no apparent nausea or vomiting Anesthetic complications: no   No notable events documented.  Last Vitals:  Vitals:   08/08/21 1345 08/08/21 1359  BP: 107/67 104/70  Pulse: 85 72  Resp: 16 14  Temp:    SpO2: 100% 99%    Last Pain:  Vitals:   08/08/21 1345  TempSrc:   PainSc: 0-No pain                 Houda Brau

## 2021-08-08 NOTE — Discharge Instructions (Addendum)
Audria Nine, M.D. Hand Surgery  POST-OPERATIVE DISCHARGE INSTRUCTIONS   PRESCRIPTIONS: You have been given a prescription to be taken as directed for post-operative pain control.  You may also take over the counter ibuprofen/aleve and tylenol for pain. Take this as directed on the packaging. Do not exceed 3000 mg tylenol/acetaminophen in 24 hours.  Ibuprofen 600-800 mg (3-4) tablets by mouth every 6 hours as needed for pain.  OR Aleve 2 tablets by mouth every 12 hours (twice daily) as needed for pain.  AND/OR Tylenol 1000 mg (2 tablets) every 8 hours as needed for pain.  Please use your pain medication carefully, as refills are limited and you may not be provided with one.  As stated above, please use over the counter pain medicine - it will also be helpful with decreasing your swelling.    ANESTHESIA: After your surgery, post-surgical discomfort or pain is likely. This discomfort can last several days to a few weeks. At certain times of the day your discomfort may be more intense.   Did you receive a nerve block?  A nerve block can provide pain relief for one hour to two days after your surgery. As long as the nerve block is working, you will experience little or no sensation in the area the surgeon operated on.  As the nerve block wears off, you will begin to experience pain or discomfort. It is very important that you begin taking your prescribed pain medication before the nerve block fully wears off. Treating your pain at the first sign of the block wearing off will ensure your pain is better controlled and more tolerable when full-sensation returns. Do not wait until the pain is intolerable, as the medicine will be less effective. It is better to treat pain in advance than to try and catch up.   General Anesthesia:  If you did not receive a nerve block during your surgery, you will need to start taking your pain medication shortly after your surgery and should continue to  do so as prescribed by your surgeon.     ICE AND ELEVATION: You may use ice for the first 48-72 hours, but it is not critical.   Motion of your fingers is very important s to decrease the swelling.  Elevation, as much as possible for the next 48 hours, is critical for decreasing swelling as well as for pain relief. Elevation means when you are seated or lying down, you hand should be at or above your heart. When walking, the hand needs to be at or above the level of your elbow.  If the bandage gets too tight, it may need to be loosened. Please contact our office and we will instruct you in how to do this.    SURGICAL BANDAGES:  Keep your dressing and/or splint clean and dry at all times.  You can remove your dressing 5 days from now and change with a dry dressing or Band-Aids as needed thereafter. You may place a plastic bag over your bandage to shower, but be careful, do not get your bandages wet.  After the bandages have been removed, it is OK to get the stitches wet in a shower or with hand washing. Do Not soak or submerge the wound yet. Please do not use lotions or creams on the stitches.      HAND THERAPY:  You may not need any. If you do, we will begin this at your follow up visit in the clinic.  ACTIVITY AND WORK: You are encouraged to move any fingers which are not in the bandage. Attached is an instruction sheet on finger motion. Do this as much as you need to make your fingers move fully and keep the swelling down.  Light use of the fingers is allowed to assist the other hand with daily hygiene and eating, but strong gripping or lifting is often uncomfortable and should be avoided.  You might miss a variable period of time from work and hopefully this issue has been discussed prior to surgery. You may not do any heavy work with your affected hand for about 2 weeks.    [facility] [address] [place],  [place]  [address] [phone]       Post  Anesthesia Home Care Instructions  Activity: Get plenty of rest for the remainder of the day. A responsible individual must stay with you for 24 hours following the procedure.  For the next 24 hours, DO NOT: -Drive a car -Paediatric nurse -Drink alcoholic beverages -Take any medication unless instructed by your physician -Make any legal decisions or sign important papers.  Meals: Start with liquid foods such as gelatin or soup. Progress to regular foods as tolerated. Avoid greasy, spicy, heavy foods. If nausea and/or vomiting occur, drink only clear liquids until the nausea and/or vomiting subsides. Call your physician if vomiting continues.  Special Instructions/Symptoms: Your throat may feel dry or sore from the anesthesia or the breathing tube placed in your throat during surgery. If this causes discomfort, gargle with warm salt water. The discomfort should disappear within 24 hours.  If you had a scopolamine patch placed behind your ear for the management of post- operative nausea and/or vomiting:  1. The medication in the patch is effective for 72 hours, after which it should be removed.  Wrap patch in a tissue and discard in the trash. Wash hands thoroughly with soap and water. 2. You may remove the patch earlier than 72 hours if you experience unpleasant side effects which may include dry mouth, dizziness or visual disturbances. 3. Avoid touching the patch. Wash your hands with soap and water after contact with the patch.

## 2021-08-08 NOTE — Op Note (Addendum)
   Date of Surgery: 08/08/2021  INDICATIONS: [name] is a 40 y.o.-year-old female with a right radial forearm mass that has been present for years.  An MRI was obtained by one of my partners which suggested a simple lipoma that was isointense to fat on all sequences and had no internal heterogeneity or septations.  Risks, benefits, and alternatives to surgery were discussed.  The patient and Patient did consent to the procedure after extensive discussion.   PREOPERATIVE DIAGNOSIS: Right forearm mass  POSTOPERATIVE DIAGNOSIS: Same.  PROCEDURE: Excision of subcutaneous forearm mass, approx 5 x 3 cm   SURGEON: Audria Nine, M.D.  ASSIST:   ANESTHESIA:  general  IV FLUIDS AND URINE: See anesthesia.  ESTIMATED BLOOD LOSS: 5 mL.  IMPLANTS: * No implants in log *   DRAINS: None  COMPLICATIONS: See description of procedure.  DESCRIPTION OF PROCEDURE: The patient was met in the preoperative holding area where the surgical site was marked and the consent form was verified.  The patient was then taken to the operating room and transferred to the operating table.  All bony prominences were well padded.  A tourniquet was applied to the right upper arm.  The operative extremity was prepped and draped in the usual and sterile fashion.  A formal time-out was performed to confirm that this was the correct patient, surgery, side, and site.   The arm was exsanguinated by gravity and the tourniquet inflated to 250 mmHg.  I began by making a longitudinal incision directly over the mass.  The skin and subcutaneous tissue was divided.  Small vessels were coagulated with the bipolar.  Blunt dissection was used to identify the mass which was well circumscribed and appeared like a simple lipoma.  I began by bluntly dissecting the mass volarly.  The mass was easily separated from the adjacent and underlying tissue.  I then turned my attention to the dorsal dissection.  Again, the mass was easily bluntly  dissected from the surrounding tissue.  On the dorsal side, the dorsal sensory branch of the radial nerve was identified, protected, and dissected free of the mass.  A large dorsal vein was also identified and protected.  The mass was excised en bloc.  The mass was superficial to the fascia and measured approximately 5 x 3 cm. The dorsal sensory branch was identified in the wound and was found to be intact along its entire exposed length.  The specimen was passed off to the back table to be sent for pathology.  The wound was then thoroughly irrigated.  Meticulous hemostasis was achieved using direct pressure and bipolar electrocautery. The wound was closed in a layered fashion with buried simple 3-0 moncryl and 4-0 nylon sutures in a horizontal mattress fashion.  The wound was dressed with xeroform, folded kerlix gauze, and an ACE wrap.  The patient was reversed from anesthesia, extubated, and transferred to the postoperative bed.  All counts were correct x 2 at the end of the procedure.  She was taken to PACU in stable condition.    POSTOPERATIVE PLAN: Pt will be discharged to home from PACU.  She was given appropriate pain medication and discharge instructions.  I will see her back in the office in 10-14 days.   Audria Nine, MD 1:27 PM

## 2021-08-08 NOTE — Anesthesia Procedure Notes (Signed)
Procedure Name: LMA Insertion Date/Time: 08/08/2021 12:36 PM Performed by: Lavonia Dana, CRNA Pre-anesthesia Checklist: Patient identified, Emergency Drugs available, Suction available and Patient being monitored Patient Re-evaluated:Patient Re-evaluated prior to induction Oxygen Delivery Method: Circle system utilized Preoxygenation: Pre-oxygenation with 100% oxygen Induction Type: IV induction Ventilation: Mask ventilation without difficulty LMA: LMA inserted LMA Size: 4.0 Number of attempts: 1 Airway Equipment and Method: Bite block Placement Confirmation: positive ETCO2 Tube secured with: Tape Dental Injury: Teeth and Oropharynx as per pre-operative assessment

## 2021-08-08 NOTE — Brief Op Note (Signed)
08/08/2021  1:21 PM  PATIENT:  [name]  40 y.o. female  PRE-OPERATIVE DIAGNOSIS:  Right Forearm Lipoma  POST-OPERATIVE DIAGNOSIS:  Right Forearm Lipoma  PROCEDURE:  Procedure(s): RIGHT FOREARM MASS EXCISION (Right)  SURGEON:  Surgeon(s) and Role:    * Sherilyn Cooter, MD - Primary  PHYSICIAN ASSISTANT:   ASSISTANTS: none   ANESTHESIA:   general  EBL:  2 mL   BLOOD ADMINISTERED:none  DRAINS: none   LOCAL MEDICATIONS USED:  MARCAINE     SPECIMEN:  Excision  DISPOSITION OF SPECIMEN:  PATHOLOGY  COUNTS:  YES  TOURNIQUET:   Total Tourniquet Time Documented: Upper Arm (Right) - 12 minutes Total: Upper Arm (Right) - 12 minutes   DICTATION: .Dragon Dictation  PLAN OF CARE: Discharge to home after PACU  PATIENT DISPOSITION:  PACU - hemodynamically stable.   Delay start of Pharmacological VTE agent (>24hrs) due to surgical blood loss or risk of bleeding: not applicable

## 2021-08-08 NOTE — Anesthesia Preprocedure Evaluation (Signed)
Anesthesia Evaluation  Patient identified by MRN, date of birth, ID band Patient awake    Reviewed: Allergy & Precautions, NPO status , Patient's Chart, lab work & pertinent test results  Airway Mallampati: II  TM Distance: >3 FB     Dental   Pulmonary Current Smoker and Patient abstained from smoking.,    breath sounds clear to auscultation       Cardiovascular negative cardio ROS   Rhythm:Regular Rate:Normal     Neuro/Psych  Headaches,  Neuromuscular disease    GI/Hepatic negative GI ROS, Neg liver ROS,   Endo/Other  negative endocrine ROS  Renal/GU negative Renal ROS     Musculoskeletal   Abdominal   Peds  Hematology negative hematology ROS (+)   Anesthesia Other Findings   Reproductive/Obstetrics                             Anesthesia Physical Anesthesia Plan  ASA: 2  Anesthesia Plan: General   Post-op Pain Management:    Induction:   PONV Risk Score and Plan: 3 and Ondansetron, Dexamethasone and Midazolam  Airway Management Planned: LMA  Additional Equipment:   Intra-op Plan:   Post-operative Plan: Extubation in OR  Informed Consent: I have reviewed the patients History and Physical, chart, labs and discussed the procedure including the risks, benefits and alternatives for the proposed anesthesia with the patient or authorized representative who has indicated his/her understanding and acceptance.     Dental advisory given  Plan Discussed with: CRNA and Anesthesiologist  Anesthesia Plan Comments:         Anesthesia Quick Evaluation

## 2021-08-11 LAB — SURGICAL PATHOLOGY

## 2021-08-12 ENCOUNTER — Encounter (HOSPITAL_BASED_OUTPATIENT_CLINIC_OR_DEPARTMENT_OTHER): Payer: Self-pay | Admitting: Orthopedic Surgery

## 2021-08-19 ENCOUNTER — Other Ambulatory Visit: Payer: Self-pay

## 2021-08-19 ENCOUNTER — Ambulatory Visit (INDEPENDENT_AMBULATORY_CARE_PROVIDER_SITE_OTHER): Payer: BC Managed Care – PPO | Admitting: Orthopedic Surgery

## 2021-08-19 DIAGNOSIS — D1720 Benign lipomatous neoplasm of skin and subcutaneous tissue of unspecified limb: Secondary | ICD-10-CM

## 2021-08-19 NOTE — Progress Notes (Signed)
   Post-Op Visit Note   Patient: [name]           Date of Birth: 16-Nov-1980           MRN: [id number] Visit Date: 08/19/2021 PCP: Nicolette Bang, MD   Assessment & Plan:  Chief Complaint:  Chief Complaint  Patient presents with   Right Forearm - Routine Post Op   Visit Diagnoses:  1. Lipoma of forearm     Plan: Her incision is clean and dry with no surrounding erythema or induration.  The wound has healed well.  The sutures were removed today.  Discussed the pathology report of a simple lipoma.  She can follow up with me as needed.   Follow-Up Instructions: No follow-ups on file.   Orders:  No orders of the defined types were placed in this encounter.  No orders of the defined types were placed in this encounter.   Imaging: No results found.  PMFS History: Patient Active Problem List   Diagnosis Date Noted   Lipoma of forearm 07/24/2021   Insomnia 06/11/2021   Acute right-sided low back pain with right-sided sciatica 12/04/2020   Anemia 01/29/2020   Past Medical History:  Diagnosis Date   Abnormal Pap smear    Anemia    Chlamydia    Gonorrhea    Headache(784.0)    HPV (human papilloma virus) infection    Trichomonas    Urinary tract infection     Family History  Problem Relation Age of Onset   Hypertension Mother    Diabetes Mother    Stroke Mother     Past Surgical History:  Procedure Laterality Date   CESAREAN SECTION  08/31/2011   Procedure: CESAREAN SECTION;  Surgeon: Agnes Lawrence, MD;  Location: [facility];  Service: Gynecology;  Laterality: N/A;  Primary Cesarean section Baby Boy @ 1418   COLPOSCOPY  2010   LEEP     LIPOMA EXCISION Right 08/08/2021   Procedure: RIGHT FOREARM MASS EXCISION;  Surgeon: Sherilyn Cooter, MD;  Location: [facility];  Service: Orthopedics;  Laterality: Right;   Social History   Occupational History   Not on file  Tobacco Use   Smoking status: Every Day    Packs/day: 0.25     Years: 5.00    Pack years: 1.25    Types: Cigarettes   Smokeless tobacco: Never   Tobacco comments:    quit with + preg  Vaping Use   Vaping Use: Never used  Substance and Sexual Activity   Alcohol use: Yes    Alcohol/week: 1.0 standard drink    Types: 1 Standard drinks or equivalent per week    Comment: socially   Drug use: No   Sexual activity: Not on file    Comment: states no more (wk ago was last)- caught him with someone else

## 2022-02-10 ENCOUNTER — Other Ambulatory Visit: Payer: Self-pay | Admitting: Family

## 2022-02-10 DIAGNOSIS — G4700 Insomnia, unspecified: Secondary | ICD-10-CM

## 2022-02-11 ENCOUNTER — Emergency Department (HOSPITAL_COMMUNITY): Payer: BC Managed Care – PPO

## 2022-02-11 ENCOUNTER — Ambulatory Visit
Admission: EM | Admit: 2022-02-11 | Discharge: 2022-02-11 | Disposition: A | Discharge status: Home / Self Care | Payer: BC Managed Care – PPO

## 2022-02-11 ENCOUNTER — Other Ambulatory Visit: Payer: Self-pay

## 2022-02-11 ENCOUNTER — Encounter (HOSPITAL_COMMUNITY): Payer: Self-pay

## 2022-02-11 ENCOUNTER — Emergency Department (HOSPITAL_COMMUNITY)
Admission: EM | Admit: 2022-02-11 | Discharge: 2022-02-11 | Disposition: A | Discharge status: Home / Self Care | Payer: BC Managed Care – PPO | Attending: Emergency Medicine | Admitting: Emergency Medicine

## 2022-02-11 DIAGNOSIS — H538 Other visual disturbances: Secondary | ICD-10-CM | POA: Insufficient documentation

## 2022-02-11 DIAGNOSIS — H532 Diplopia: Secondary | ICD-10-CM | POA: Diagnosis not present

## 2022-02-11 DIAGNOSIS — R202 Paresthesia of skin: Secondary | ICD-10-CM

## 2022-02-11 DIAGNOSIS — R531 Weakness: Secondary | ICD-10-CM | POA: Insufficient documentation

## 2022-02-11 DIAGNOSIS — R262 Difficulty in walking, not elsewhere classified: Secondary | ICD-10-CM | POA: Diagnosis not present

## 2022-02-11 DIAGNOSIS — R200 Anesthesia of skin: Secondary | ICD-10-CM

## 2022-02-11 DIAGNOSIS — R29818 Other symptoms and signs involving the nervous system: Secondary | ICD-10-CM | POA: Diagnosis not present

## 2022-02-11 DIAGNOSIS — Z20822 Contact with and (suspected) exposure to covid-19: Secondary | ICD-10-CM | POA: Insufficient documentation

## 2022-02-11 DIAGNOSIS — R9431 Abnormal electrocardiogram [ECG] [EKG]: Secondary | ICD-10-CM | POA: Diagnosis not present

## 2022-02-11 LAB — CBC
HCT: 39.3 % (ref 36.0–46.0)
Hemoglobin: 12.7 g/dL (ref 12.0–15.0)
MCH: 31.1 pg (ref 26.0–34.0)
MCHC: 32.3 g/dL (ref 30.0–36.0)
MCV: 96.3 fL (ref 80.0–100.0)
Platelets: 226 10*3/uL (ref 150–400)
RBC: 4.08 MIL/uL (ref 3.87–5.11)
RDW: 12.7 % (ref 11.5–15.5)
WBC: 7.9 10*3/uL (ref 4.0–10.5)
nRBC: 0 % (ref 0.0–0.2)

## 2022-02-11 LAB — URINALYSIS, ROUTINE W REFLEX MICROSCOPIC
Bilirubin Urine: NEGATIVE
Glucose, UA: NEGATIVE mg/dL
Ketones, ur: NEGATIVE mg/dL
Leukocytes,Ua: NEGATIVE
Nitrite: NEGATIVE
Protein, ur: NEGATIVE mg/dL
Specific Gravity, Urine: 1.014 (ref 1.005–1.030)
pH: 6 (ref 5.0–8.0)

## 2022-02-11 LAB — COMPREHENSIVE METABOLIC PANEL
ALT: 9 U/L (ref 0–44)
AST: 14 U/L — ABNORMAL LOW (ref 15–41)
Albumin: 4 g/dL (ref 3.5–5.0)
Alkaline Phosphatase: 55 U/L (ref 38–126)
Anion gap: 5 (ref 5–15)
BUN: 8 mg/dL (ref 6–20)
CO2: 27 mmol/L (ref 22–32)
Calcium: 9 mg/dL (ref 8.9–10.3)
Chloride: 105 mmol/L (ref 98–111)
Creatinine, Ser: 0.78 mg/dL (ref 0.44–1.00)
GFR, Estimated: >60 mL/min (ref >60)
Glucose, Bld: 97 mg/dL (ref 70–99)
Potassium: 4 mmol/L (ref 3.5–5.1)
Sodium: 137 mmol/L (ref 135–145)
Total Bilirubin: 0.7 mg/dL (ref 0.3–1.2)
Total Protein: 7.1 g/dL (ref 6.5–8.1)

## 2022-02-11 LAB — I-STAT CHEM 8, ED
BUN: 9 mg/dL (ref 6–20)
Calcium, Ion: 1.08 mmol/L — ABNORMAL LOW (ref 1.15–1.40)
Chloride: 103 mmol/L (ref 98–111)
Creatinine, Ser: 0.7 mg/dL (ref 0.44–1.00)
Glucose, Bld: 95 mg/dL (ref 70–99)
HCT: 40 % (ref 36.0–46.0)
Hemoglobin: 13.6 g/dL (ref 12.0–15.0)
Potassium: 3.8 mmol/L (ref 3.5–5.1)
Sodium: 137 mmol/L (ref 135–145)
TCO2: 25 mmol/L (ref 22–32)

## 2022-02-11 LAB — DIFFERENTIAL
Abs Immature Granulocytes: 0.02 10*3/uL (ref 0.00–0.07)
Basophils Absolute: 0 10*3/uL (ref 0.0–0.1)
Basophils Relative: 0 %
Eosinophils Absolute: 0 10*3/uL (ref 0.0–0.5)
Eosinophils Relative: 0 %
Immature Granulocytes: 0 %
Lymphocytes Relative: 31 %
Lymphs Abs: 2.5 10*3/uL (ref 0.7–4.0)
Monocytes Absolute: 0.2 10*3/uL (ref 0.1–1.0)
Monocytes Relative: 3 %
Neutro Abs: 5.1 10*3/uL (ref 1.7–7.7)
Neutrophils Relative %: 66 %

## 2022-02-11 LAB — RAPID URINE DRUG SCREEN, HOSP PERFORMED
Amphetamines: NOT DETECTED
Barbiturates: NOT DETECTED
Benzodiazepines: NOT DETECTED
Cocaine: NOT DETECTED
Opiates: NOT DETECTED
Tetrahydrocannabinol: NOT DETECTED

## 2022-02-11 LAB — I-STAT BETA HCG BLOOD, ED (MC, WL, AP ONLY): I-stat hCG, quantitative: <5 m[IU]/mL (ref <5)

## 2022-02-11 LAB — RESP PANEL BY RT-PCR (FLU A&B, COVID) ARPGX2
Influenza A by PCR: NEGATIVE
Influenza B by PCR: NEGATIVE
SARS Coronavirus 2 by RT PCR: NEGATIVE

## 2022-02-11 LAB — APTT: aPTT: 29 seconds (ref 24–36)

## 2022-02-11 LAB — PROTIME-INR
INR: 0.9 (ref 0.8–1.2)
Prothrombin Time: 12.3 seconds (ref 11.4–15.2)

## 2022-02-11 LAB — ETHANOL: Alcohol, Ethyl (B): <10 mg/dL (ref <10)

## 2022-02-11 NOTE — Discharge Instructions (Addendum)
Your symptoms warrant further evaluation in setting of the ER given the acute onset of blurred vision and numbness of right extremities. Recommend further evaluation at [facility] ER ?

## 2022-02-11 NOTE — ED Provider Notes (Signed)
?[facility] ?Provider Note ? ? ?CSN: [id number] ?Arrival date & time: 02/11/22  1036 ? ?  ? ?History ? ?Chief Complaint  ?Patient presents with  ? Eye Problem  ? Numbness  ? ? ?[name] is a 41 y.o. female. ? ? ?Eye Problem ?Associated symptoms: numbness and weakness   ?Patient presenting for strokelike symptoms.  She noticed some mild weakness and numbness to her right hand and foot yesterday.  This morning, upon awakening, she had bilateral blurry vision.  Blurry vision has been present intermittently throughout the day.  She did take her kids to school and go to work.  While at work, she noticed continued continued right-sided weakness and numbness to areas of foot and hand on the right side.  When walking, she felt like she was listing to the left.  She initially presented to urgent care but was sent to the emergency department for further evaluation.  Currently, she endorses blurred vision.  She states that this is throughout her entire visual fields of both eyes.  She denies any associated pain. ?  ? ?Home Medications ?Prior to Admission medications   ?Not on File  ?   ? ?Allergies    ?Patient has no known allergies.   ? ?Review of Systems   ?Review of Systems  ?Eyes:  Positive for visual disturbance.  ?Neurological:  Positive for weakness and numbness.  ?All other systems reviewed and are negative. ? ?Physical Exam ?Updated Vital Signs ?BP 102/63 (BP Location: Right Arm)   Pulse 80   Temp 98.8 ?F (37.1 ?C) (Oral)   Resp 16   Ht '5\' 8"'$  (1.727 m)   Wt 74.8 kg   SpO2 98%   BMI 25.09 kg/m?  ?Physical Exam ?Vitals and nursing note reviewed.  ?Constitutional:   ?   General: She is not in acute distress. ?   Appearance: Normal appearance. She is well-developed and normal weight. She is not ill-appearing, toxic-appearing or diaphoretic.  ?HENT:  ?   Head: Normocephalic and atraumatic.  ?   Right Ear: External ear normal.  ?   Left Ear: External ear normal.  ?    Nose: Nose normal.  ?   Mouth/Throat:  ?   Mouth: Mucous membranes are moist.  ?   Pharynx: Oropharynx is clear.  ?Eyes:  ?   General: No visual field deficit. ?   Extraocular Movements: Extraocular movements intact.  ?   Conjunctiva/sclera: Conjunctivae normal.  ?Cardiovascular:  ?   Rate and Rhythm: Normal rate and regular rhythm.  ?   Heart sounds: No murmur heard. ?Pulmonary:  ?   Effort: Pulmonary effort is normal. No respiratory distress.  ?Abdominal:  ?   General: Abdomen is flat. There is no distension.  ?Musculoskeletal:     ?   General: No swelling or tenderness. Normal range of motion.  ?   Cervical back: Normal range of motion and neck supple. No rigidity.  ?   Right lower leg: No edema.  ?   Left lower leg: No edema.  ?Skin: ?   General: Skin is warm and dry.  ?   Capillary Refill: Capillary refill takes less than 2 seconds.  ?   Coloration: Skin is not jaundiced or pale.  ?Neurological:  ?   Mental Status: She is alert.  ?   Cranial Nerves: Cranial nerves 2-12 are intact. No cranial nerve deficit, dysarthria or facial asymmetry.  ?   Sensory: Sensory deficit (Endorses slightly  diminished sensation to right foot and right hand) present.  ?   Motor: Weakness (Mildly diminished strength of right upper and lower extremities) present. No abnormal muscle tone or pronator drift.  ?   Coordination: Coordination is intact.  ?   Comments: 20/70 visual acuity bilaterally  ?Psychiatric:     ?   Mood and Affect: Mood normal.     ?   Behavior: Behavior normal.  ? ? ?ED Results / Procedures / Treatments   ?Labs ?(all labs ordered are listed, but only abnormal results are displayed) ?Labs Reviewed  ?COMPREHENSIVE METABOLIC PANEL - Abnormal; Notable for the following components:  ?    Result Value  ? AST 14 (*)   ? All other components within normal limits  ?URINALYSIS, ROUTINE W REFLEX MICROSCOPIC - Abnormal; Notable for the following components:  ? APPearance HAZY (*)   ? Hgb urine dipstick SMALL (*)   ? Bacteria, UA  RARE (*)   ? All other components within normal limits  ?I-STAT CHEM 8, ED - Abnormal; Notable for the following components:  ? Calcium, Ion 1.08 (*)   ? All other components within normal limits  ?RESP PANEL BY RT-PCR (FLU A&B, COVID) ARPGX2  ?ETHANOL  ?PROTIME-INR  ?APTT  ?CBC  ?DIFFERENTIAL  ?RAPID URINE DRUG SCREEN, HOSP PERFORMED  ?I-STAT BETA HCG BLOOD, ED ([facility], [facility], [facility] ONLY)  ? ? ?EKG ?EKG Interpretation ? ?Date/Time:  Wednesday February 11 2022 12:11:59 EDT ?Ventricular Rate:  88 ?PR Interval:  142 ?QRS Duration: 96 ?QT Interval:  354 ?QTC Calculation: 428 ?R Axis:   76 ?Text Interpretation: Normal sinus rhythm Normal ECG Confirmed by Godfrey Pick [phone]) on 02/11/2022 2:13:43 PM ? ?Radiology ?CT HEAD WO CONTRAST ? ?Result Date: 02/11/2022 ?CLINICAL DATA:  Neuro deficit, acute, stroke suspected. Blurry/double vision since 5 a.m. Difficult to walk straight. No reported injury. EXAM: CT HEAD WITHOUT CONTRAST TECHNIQUE: Contiguous axial images were obtained from the base of the skull through the vertex without intravenous contrast. RADIATION DOSE REDUCTION: This exam was performed according to the departmental dose-optimization program which includes automated exposure control, adjustment of the mA and/or kV according to patient size and/or use of iterative reconstruction technique. COMPARISON:  04/14/2015 maxillofacial CT. FINDINGS: Brain: No evidence of parenchymal hemorrhage or extra-axial fluid collection. No mass lesion, mass effect, or midline shift. No CT evidence of acute infarction. Cerebral volume is age appropriate. No ventriculomegaly. Vascular: No acute abnormality. Skull: No evidence of calvarial fracture. Sinuses/Orbits: The visualized paranasal sinuses are essentially clear. Other:  The mastoid air cells are unopacified. IMPRESSION: No evidence of acute intracranial abnormality. Electronically Signed   By: Ilona Sorrel M.D.   On: 02/11/2022 13:34  ? ?MR BRAIN WO CONTRAST ? ?Result Date:  02/11/2022 ?CLINICAL DATA:  Neuro deficit, acute, stroke suspected EXAM: MRI HEAD WITHOUT CONTRAST TECHNIQUE: Multiplanar, multiecho pulse sequences of the brain and surrounding structures were obtained without intravenous contrast. COMPARISON:  None. FINDINGS: Brain: No acute infarction, hemorrhage, hydrocephalus, extra-axial collection or mass lesion. Minimal T2/FLAIR hyperintensities within the white matter, nonspecific but consider within normal limits for age. Vascular: Major arterial flow voids are maintained at the skull base. Skull and upper cervical spine: Normal marrow signal. Sinuses/Orbits: Clear sinuses.  No acute orbital findings. Other: No mastoid effusions IMPRESSION: No evidence of acute intracranial abnormality. Electronically Signed   By: Margaretha Sheffield M.D.   On: 02/11/2022 16:49   ? ?Procedures ?Procedures  ? ? ?Medications Ordered in ED ?Medications - No data to  display ? ?ED Course/ Medical Decision Making/ A&P ?  ?                        ?Medical Decision Making ?Amount and/or Complexity of Data Reviewed ?Radiology: ordered. ? ? ?This patient presents to the ED for concern of strokelike symptoms, this involves an extensive number of treatment options, and is a complaint that carries with it a high risk of complications and morbidity.  The differential diagnosis includes CVA, TIA, seizure, complex migraine, hypoglycemia, infection, metabolic abnormalities, psychogenic etiology ? ? ?Co morbidities that complicate the patient evaluation ? ?Anemia, tobacco use ? ? ?Additional history obtained: ? ?Additional history obtained from N/A ?External records from outside source obtained and reviewed including EMR ? ? ?Lab Tests: ? ?I Ordered, and personally interpreted labs.  The pertinent results include: Normal findings ? ? ?Imaging Studies ordered: ? ?I ordered imaging studies including CT head, MRI brain ?I independently visualized and interpreted imaging which showed no acute findings on CT head.   MRI study pending at time of signout ?I agree with the radiologist interpretation ? ? ?Cardiac Monitoring: / EKG: ? ?The patient was maintained on a cardiac monitor.  I personally viewed and interpreted the cardiac monitored w

## 2022-02-11 NOTE — ED Triage Notes (Signed)
Pt states woke up this morning ~5am with blurred vision. States tried rinsing her face without relief. States she cannot walk straight 2/2 blurred/double vision. States historically has had 20/20 vision. States "I don't think I've ever seen an eye doctor." Also c/o numbness to right hand and right foot she associates with poor sleeping posture, headaches. Denies nausea, vomiting.  ?

## 2022-02-11 NOTE — ED Provider Triage Note (Signed)
Emergency Medicine Provider Triage Evaluation Note ? ?[name] , a 41 y.o. female  was evaluated in triage.  Pt complains of right-sided weakness on the hand and leg.  She states that symptoms began last night around 9 PM.  She states that she noted her right arm and leg feeling like they were asleep.  She states that she woke up this morning with blurry vision.  She states that she thought she got up too fast however the blurry vision persisted.  She stated today while she was at work she was having difficulty with her balance and presented to urgent care.  Urgent care sent her here for stroke work-up.. ? ?Review of Systems  ?Positive: See above ?Negative:  ? ?Physical Exam  ?BP 112/73 (BP Location: Left Arm)   Pulse 86   Temp 98.8 ?F (37.1 ?C) (Oral)   Resp 16   Ht '5\' 8"'$  (1.727 m)   Wt 74.8 kg   SpO2 99%   BMI 25.09 kg/m?  ?Gen:   Awake, no distress   ?Resp:  Normal effort  ?MSK:   Moves extremities without difficulty  ?Other:  Cranial nerves II through XII intact.  She does have focal weakness with her right grip.  No sensation changes.   ? ?Medical Decision Making  ?Medically screening exam initiated at 12:00 PM.  Appropriate orders placed.  [name] was informed that the remainder of the evaluation will be completed by another provider, this initial triage assessment does not replace that evaluation, and the importance of remaining in the ED until their evaluation is complete. ? ?We will place all stroke work-up.  Out of window for code stroke or TNK. ?  ?Mickie Hillier, PA-C ?02/11/22 1203 ? ?

## 2022-02-11 NOTE — Discharge Instructions (Signed)
MRI is normal.  No stroke.  Recommend rest and hydration.  Follow-up with your primary care doctor. ?

## 2022-02-11 NOTE — ED Notes (Signed)
Reviewed discharge instructions with patient and daughter. Follow-up care reviewed. Patient and daughter verbalized understanding. Patient A&Ox4, VSS, and ambulatory with steady gait upon discharge.  

## 2022-02-11 NOTE — ED Triage Notes (Signed)
Pt arrived POV from home c/o right sided numbness and blurred vision. Pt's LKW was 02/10/2022 at 6pm. Pt stated the numbness started late last night and then she woke up with blurred vision. No other neuro deficits noted.  ?

## 2022-02-11 NOTE — ED Provider Notes (Signed)
?[facility] ? ? ? ?CSN: [id number] ?Arrival date & time: 02/11/22  2409 ? ? ?  ? ?History   ?Chief Complaint ?Chief Complaint  ?Patient presents with  ? Blurred Vision  ? ? ?HPI ?[name] is a 41 y.o. female.  ? ?HPI ?Patient presents for evaluation of acute onset blurring of vision and right sided numbness. Patient reports awakening around 0500 this morning with these symptoms. Upon awakening she experienced a generalized headache, right sided numbness, she feels her balance is off. She has never experienced any problems with her vision before. She  ? ?Past Medical History:  ?Diagnosis Date  ? Abnormal Pap smear   ? Anemia   ? Chlamydia   ? Gonorrhea   ? Headache(784.0)   ? HPV (human papilloma virus) infection   ? Trichomonas   ? Urinary tract infection   ? ? ?Patient Active Problem List  ? Diagnosis Date Noted  ? Lipoma of forearm 07/24/2021  ? Insomnia 06/11/2021  ? Acute right-sided low back pain with right-sided sciatica 12/04/2020  ? Anemia 01/29/2020  ? ? ?Past Surgical History:  ?Procedure Laterality Date  ? CESAREAN SECTION  08/31/2011  ? Procedure: CESAREAN SECTION;  Surgeon: Agnes Lawrence, MD;  Location: [facility];  Service: Gynecology;  Laterality: N/A;  Primary Cesarean section Baby Boy @ 1418  ? COLPOSCOPY  2010  ? LEEP    ? LIPOMA EXCISION Right 08/08/2021  ? Procedure: RIGHT FOREARM MASS EXCISION;  Surgeon: Sherilyn Cooter, MD;  Location: [facility];  Service: Orthopedics;  Laterality: Right;  ? ? ?OB History   ? ? Gravida  ?3  ? Para  ?3  ? Term  ?2  ? Preterm  ?1  ? AB  ?   ? Living  ?3  ?  ? ? SAB  ?   ? IAB  ?   ? Ectopic  ?   ? Multiple  ?   ? Live Births  ?3  ?   ?  ?  ? ? ? ?Home Medications   ? ?Prior to Admission medications   ?Medication Sig Start Date End Date Taking? Authorizing Provider  ?traZODone (DESYREL) 50 MG tablet Take 1 tablet (50 mg total) by mouth at bedtime. 06/11/21 08/08/21  [name], NP  ? ? ?Family History ?Family History   ?Problem Relation Age of Onset  ? Hypertension Mother   ? Diabetes Mother   ? Stroke Mother   ? ? ?Social History ?Social History  ? ?Tobacco Use  ? Smoking status: Every Day  ?  Packs/day: 0.25  ?  Years: 5.00  ?  Pack years: 1.25  ?  Types: Cigarettes  ? Smokeless tobacco: Never  ? Tobacco comments:  ?  quit with + preg  ?Vaping Use  ? Vaping Use: Never used  ?Substance Use Topics  ? Alcohol use: Yes  ?  Alcohol/week: 1.0 standard drink  ?  Types: 1 Standard drinks or equivalent per week  ?  Comment: socially  ? Drug use: No  ? ? ? ?Allergies   ?Patient has no known allergies. ? ? ?Review of Systems ?Review of Systems ?Pertinent negatives listed in HPI  ? ?Physical Exam ?Triage Vital Signs ?ED Triage Vitals [02/11/22 0930]  ?Enc Vitals Group  ?   BP 109/76  ?   Pulse Rate 84  ?   Resp 18  ?   Temp 98.2 ?F (36.8 ?C)  ?   Temp Source  Oral  ?   SpO2 98 %  ?   Weight   ?   Height   ?   Head Circumference   ?   Peak Flow   ?   Pain Score 0  ?   Pain Loc   ?   Pain Edu?   ?   Excl. in [place]?   ? ?No data found. ? ?Updated Vital Signs ?BP 109/76 (BP Location: Left Arm)   Pulse 84   Temp 98.2 ?F (36.8 ?C) (Oral)   Resp 18   SpO2 98%  ? ?Visual Acuity ?Right Eye Distance: 20/50 ?Left Eye Distance: 20/50 ?Bilateral Distance: 20/50 ? ?Right Eye Near:   ?Left Eye Near:    ?Bilateral Near:    ? ?Physical Exam ?Constitutional:   ?   Appearance: Normal appearance.  ?HENT:  ?   Head: Normocephalic.  ?   Nose: Nose normal.  ?Eyes:  ?   General: Lids are normal.  ?   Extraocular Movements: Extraocular movements intact.  ?   Pupils: Pupils are equal, round, and reactive to light.  ?   Comments: Patient diminished visual acuity on Snellen Chart exam  ?Cardiovascular:  ?   Rate and Rhythm: Normal rate and regular rhythm.  ?Pulmonary:  ?   Effort: Pulmonary effort is normal.  ?   Breath sounds: Normal breath sounds.  ?Musculoskeletal:  ?   Cervical back: Full passive range of motion without pain.  ?Skin: ?   General: Skin is warm and  dry.  ?   Capillary Refill: Capillary refill takes less than 2 seconds.  ?Neurological:  ?   Mental Status: She is alert and oriented to person, place, and time.  ?   Motor: Weakness present.  ?   Coordination: Romberg sign positive. Coordination abnormal.  ?   Gait: Gait abnormal.  ?Psychiatric:     ?   Attention and Perception: Attention normal.     ?   Speech: Speech normal.     ?   Behavior: Behavior is cooperative.     ?   Thought Content: Thought content normal.  ? ?UC Treatments / Results  ?Labs ?(all labs ordered are listed, but only abnormal results are displayed) ?Labs Reviewed - No data to display ? ?EKG ? ? ?Radiology ?No results found. ? ?Procedures ?Procedures (including critical care time) ? ?Medications Ordered in UC ?Medications - No data to display ? ?Initial Impression / Assessment and Plan / UC Course  ?I have reviewed the triage vital signs and the nursing notes. ? ?Pertinent labs & imaging results that were available during my care of the patient were reviewed by me and considered in my medical decision making (see chart for details). ? ?  ?Given patient's acute onset of symptoms discussed with patient indication that she needs further work-up and evaluation in the setting of the ER.  She is alert and oriented.  She was brought to the clinic by her daughter who is now unable to come and pick her up.  Gave the option of EMS however due to cost she would like to try other options such as [facility].Vital signs are stable.  Patient is alert and oriented and ambulatory. ?Final Clinical Impressions(s) / UC Diagnoses  ? ?Final diagnoses:  ?Right sided numbness  ?Blurred vision, bilateral  ?Right sided weakness  ? ? ? ?Discharge Instructions   ? ?  ?Your symptoms warrant further evaluation in setting of the ER given the acute onset of  blurred vision and numbness of right extremities. Recommend further evaluation at [facility] ? ? ? ?ED Prescriptions   ?None ?  ? ?PDMP not reviewed this encounter. ?   ?Scot Jun, FNP ?02/11/22 1002 ? ?

## 2022-02-11 NOTE — ED Provider Notes (Signed)
Patient signed out to me awaiting MRI of her brain.  Here with some right-sided numbness, intermittent blurred vision.  History of headaches.  No other stroke risk factors.  Overall we will get MRI to rule out stroke and if normal can be likely discharge.  Lab work and CT scan thus far unremarkable. ? ?MRI shows no evidence of stroke.  May be complex migraine but overall could just be paresthesias or anxiety related symptoms.  Discharged in good condition. ? ?This chart was dictated using voice recognition software.  Despite best efforts to proofread,  errors can occur which can change the documentation meaning.  ?  Lennice Sites, DO ?02/11/22 1653 ? ?

## 2022-02-11 NOTE — ED Notes (Signed)
Pt in MRI at this time 

## 2022-02-11 NOTE — ED Notes (Signed)
Pt back from MRI 
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
## 2022-03-10 ENCOUNTER — Other Ambulatory Visit (HOSPITAL_COMMUNITY)
Admission: RE | Admit: 2022-03-10 | Discharge: 2022-03-10 | Disposition: A | Discharge status: Home / Self Care | Payer: BC Managed Care – PPO | Source: Ambulatory Visit | Attending: Family | Admitting: Family

## 2022-03-10 ENCOUNTER — Ambulatory Visit: Payer: BC Managed Care – PPO | Admitting: Family

## 2022-03-10 ENCOUNTER — Encounter: Payer: Self-pay | Admitting: Family

## 2022-03-10 VITALS — BP 108/75 | HR 83 | Temp 98.3°F | Resp 18 | Ht 67.99 in | Wt 164.0 lb

## 2022-03-10 DIAGNOSIS — Z3202 Encounter for pregnancy test, result negative: Secondary | ICD-10-CM

## 2022-03-10 DIAGNOSIS — Z113 Encounter for screening for infections with a predominantly sexual mode of transmission: Secondary | ICD-10-CM | POA: Insufficient documentation

## 2022-03-10 DIAGNOSIS — F32A Depression, unspecified: Secondary | ICD-10-CM

## 2022-03-10 DIAGNOSIS — F419 Anxiety disorder, unspecified: Secondary | ICD-10-CM

## 2022-03-10 LAB — POCT URINALYSIS DIP (CLINITEK)
Bilirubin, UA: NEGATIVE
Glucose, UA: NEGATIVE mg/dL
Ketones, POC UA: NEGATIVE mg/dL
Leukocytes, UA: NEGATIVE
Nitrite, UA: NEGATIVE
Spec Grav, UA: >=1.03 — AB (ref 1.010–1.025)
Urobilinogen, UA: 1 E.U./dL
pH, UA: 5.5 (ref 5.0–8.0)

## 2022-03-10 LAB — POCT URINE PREGNANCY: Preg Test, Ur: NEGATIVE

## 2022-03-10 MED ORDER — TRAZODONE HCL 50 MG PO TABS: 50.0000 mg | ORAL_TABLET | Freq: Every day | ORAL | 0 refills | Status: DC

## 2022-03-10 NOTE — Progress Notes (Signed)
Pt presents for STD screening, states she had sex about 2 months agp and condom popped  ?Dealing w/anxiety/stress and grief wants to start back on Trazodone ?Req HIV, Syphilis and HSV testing  ?

## 2022-03-11 ENCOUNTER — Other Ambulatory Visit: Payer: Self-pay | Admitting: Family

## 2022-03-11 ENCOUNTER — Encounter: Payer: Self-pay | Admitting: Family

## 2022-03-11 DIAGNOSIS — B009 Herpesviral infection, unspecified: Secondary | ICD-10-CM

## 2022-03-11 HISTORY — DX: Herpesviral infection, unspecified: B00.9

## 2022-03-11 LAB — CERVICOVAGINAL ANCILLARY ONLY
Bacterial Vaginitis (gardnerella): NEGATIVE
Candida Glabrata: NEGATIVE
Candida Vaginitis: NEGATIVE
Chlamydia: NEGATIVE
Comment: NEGATIVE
Comment: NEGATIVE
Comment: NEGATIVE
Comment: NEGATIVE
Comment: NEGATIVE
Comment: NORMAL
Neisseria Gonorrhea: NEGATIVE
Trichomonas: NEGATIVE

## 2022-03-11 LAB — HSV-2 IGG SUPPLEMENTAL TEST

## 2022-03-11 LAB — HSV(HERPES SIMPLEX VRS) I + II AB-IGG
HSV 1 Glycoprotein G Ab, IgG: 22.1 index — ABNORMAL HIGH (ref 0.00–0.90)
HSV 2 IgG, Type Spec: 4.3 index — ABNORMAL HIGH (ref 0.00–0.90)

## 2022-03-11 LAB — RPR: RPR Ser Ql: NONREACTIVE

## 2022-03-11 LAB — HIV ANTIBODY (ROUTINE TESTING W REFLEX): HIV Screen 4th Generation wRfx: NONREACTIVE

## 2022-03-11 MED ORDER — VALACYCLOVIR HCL 1 G PO TABS: 1000.0000 mg | ORAL_TABLET | Freq: Two times a day (BID) | ORAL | 0 refills | Status: DC

## 2022-03-11 NOTE — Progress Notes (Signed)
Gonorrhea, Chlamydia, Trichomonas, Candida Vaginitis (yeast infection), and Bacterial Vaginitis negative.

## 2022-03-11 NOTE — Progress Notes (Signed)
-   Valacyclovir prescribed for herpes.  ? ?- HIV negative.  ? ?- Syphilis negative.

## 2022-03-11 NOTE — Progress Notes (Signed)
Urinalysis and urine pregnancy discussed in office.

## 2022-03-12 ENCOUNTER — Ambulatory Visit: Payer: BC Managed Care – PPO | Admitting: Family Medicine

## 2022-05-05 ENCOUNTER — Encounter: Payer: Self-pay | Admitting: Family

## 2022-05-05 ENCOUNTER — Ambulatory Visit (INDEPENDENT_AMBULATORY_CARE_PROVIDER_SITE_OTHER): Payer: BC Managed Care – PPO

## 2022-05-05 ENCOUNTER — Other Ambulatory Visit (HOSPITAL_COMMUNITY)
Admission: RE | Admit: 2022-05-05 | Discharge: 2022-05-05 | Disposition: A | Discharge status: Home / Self Care | Payer: BC Managed Care – PPO | Source: Ambulatory Visit | Attending: Family Medicine | Admitting: Family Medicine

## 2022-05-05 DIAGNOSIS — N898 Other specified noninflammatory disorders of vagina: Secondary | ICD-10-CM | POA: Diagnosis not present

## 2022-05-05 DIAGNOSIS — B3731 Acute candidiasis of vulva and vagina: Secondary | ICD-10-CM | POA: Insufficient documentation

## 2022-05-05 LAB — POCT URINALYSIS DIP (CLINITEK)
Bilirubin, UA: NEGATIVE
Glucose, UA: NEGATIVE mg/dL
Ketones, POC UA: NEGATIVE mg/dL
Nitrite, UA: POSITIVE — AB
POC PROTEIN,UA: NEGATIVE
Spec Grav, UA: 1.02 (ref 1.010–1.025)
Urobilinogen, UA: 4 E.U./dL — AB
pH, UA: 6 (ref 5.0–8.0)

## 2022-05-05 NOTE — Telephone Encounter (Signed)
Spoke to pt scheduled for nurse visit

## 2022-05-06 ENCOUNTER — Other Ambulatory Visit: Payer: Self-pay | Admitting: Family

## 2022-05-06 ENCOUNTER — Encounter: Payer: Self-pay | Admitting: Family

## 2022-05-06 DIAGNOSIS — B3731 Acute candidiasis of vulva and vagina: Secondary | ICD-10-CM | POA: Insufficient documentation

## 2022-05-06 HISTORY — DX: Acute candidiasis of vulva and vagina: B37.31

## 2022-05-06 LAB — CERVICOVAGINAL ANCILLARY ONLY
Bacterial Vaginitis (gardnerella): NEGATIVE
Candida Glabrata: NEGATIVE
Candida Vaginitis: POSITIVE — AB
Chlamydia: NEGATIVE
Comment: NEGATIVE
Comment: NEGATIVE
Comment: NEGATIVE
Comment: NEGATIVE
Comment: NEGATIVE
Comment: NORMAL
Neisseria Gonorrhea: NEGATIVE
Trichomonas: NEGATIVE

## 2022-05-06 MED ORDER — FLUCONAZOLE 150 MG PO TABS: 150.0000 mg | ORAL_TABLET | Freq: Once | ORAL | 0 refills | Status: AC

## 2022-05-08 ENCOUNTER — Ambulatory Visit: Payer: BC Managed Care – PPO

## 2022-05-08 ENCOUNTER — Other Ambulatory Visit: Payer: Self-pay | Admitting: Family

## 2022-05-08 DIAGNOSIS — N3000 Acute cystitis without hematuria: Secondary | ICD-10-CM

## 2022-05-08 LAB — URINE CULTURE

## 2022-05-08 MED ORDER — AMOXICILLIN-POT CLAVULANATE 875-125 MG PO TABS: 1.0000 | ORAL_TABLET | Freq: Two times a day (BID) | ORAL | 0 refills | Status: AC

## 2022-05-14 ENCOUNTER — Encounter: Payer: Self-pay | Admitting: Family

## 2022-05-14 ENCOUNTER — Ambulatory Visit: Payer: Self-pay

## 2022-05-14 NOTE — Telephone Encounter (Signed)
  Chief Complaint: constipation Symptoms: Last BM Monday, abdomen pain, bloating and distention Frequency: since Monday Pertinent Negatives: NA Disposition: '[]'$ ED /'[]'$ Urgent Care (no appt availability in office) / '[]'$ Appointment(In office/virtual)/ '[]'$  [facility]/ '[x]'$ Home Care/ '[]'$ Refused Recommended Disposition /'[]'$ [facility]/ '[]'$  Follow-up with PCP Additional Notes: pt states she went to [facility] and got some OTC laxatives and took 2 but didn't help. Can feel the stool coming down but wont come out and having to strain. Pt was given care advice and advised to call back if they dont help. Pt verbalized understanding.   Reason for Disposition  MILD constipation  Answer Assessment - Initial Assessment Questions 1. STOOL PATTERN OR FREQUENCY: "How often do you have a bowel movement (BM)?"  (Normal range: 3 times a day to every 3 days)  "When was your last BM?"       Last BM Monday 2. STRAINING: "Do you have to strain to have a BM?"      yes 3. RECTAL PAIN: "Does your rectum hurt when the stool comes out?" If Yes, ask: "Do you have hemorrhoids? How bad is the pain?"  (Scale 1-10; or mild, moderate, severe)     no 8. MEDICINES: "Have you been taking any new medicines?" "Are you taking any narcotic pain medicines?" (e.g., Dilaudid, morphine, Percocet, Vicodin)     No 9. LAXATIVES: "Have you been using any stool softeners, laxatives, or enemas?"  If Yes, ask "What, how often, and when was the last time?"     Laxative 2 yesterday 12. OTHER SYMPTOMS: "Do you have any other symptoms?" (e.g., abdomen pain, bloating, fever, vomiting)       Abdomen pain, bloating  Protocols used: Constipation-A-AH

## 2022-05-14 NOTE — Telephone Encounter (Signed)
Unlikely related.

## 2022-06-15 NOTE — Progress Notes (Signed)
Patient ID: [name], female    DOB: December 06, 1980  MRN: [id number]  CC: Annual Physical Exam  Subjective: [name] is a 41 y.o. female who presents for annual physical exam.   Her concerns today include:  - Persisting bloating and constipation with straining. Bowel movement twice weekly. Denies blood in stool. States when bloated stomach appears as 4 months gravid. Has tried over-the-counter regimen without relief. She is drinking plenty of water and consumes fruit and vegetables. - Itching bumps sparse on hands and legs. No additional symptoms.  - Requests STD and HSV testing. History of yeast infection and BV. Reports she does not have any herpes-related symptoms.  Patient Active Problem List   Diagnosis Date Noted   Candida vaginitis 05/06/2022   HSV (herpes simplex virus) infection 03/11/2022   Lipoma of forearm 07/24/2021   Insomnia 06/11/2021   Acute right-sided low back pain with right-sided sciatica 12/04/2020   Anemia 01/29/2020     Current Outpatient Medications on File Prior to Visit  Medication Sig Dispense Refill   traZODone (DESYREL) 50 MG tablet Take 1 tablet (50 mg total) by mouth at bedtime. 30 tablet 0   No current facility-administered medications on file prior to visit.    No Known Allergies  Social History   Socioeconomic History   Marital status: Single    Spouse name: Not on file   Number of children: Not on file   Years of education: Not on file   Highest education level: Not on file  Occupational History   Not on file  Tobacco Use   Smoking status: Former    Packs/day: 0.25    Years: 5.00    Total pack years: 1.25    Types: Cigarettes    Passive exposure: Past   Smokeless tobacco: Never   Tobacco comments:    quit with + preg  Vaping Use   Vaping Use: Never used  Substance and Sexual Activity   Alcohol use: Yes    Alcohol/week: 1.0 standard drink of alcohol    Types: 1 Standard drinks or equivalent per week     Comment: socially   Drug use: No   Sexual activity: Not on file    Comment: states no more (wk ago was last)- caught him with someone else  Other Topics Concern   Not on file  Social History Narrative   Not on file   Social Determinants of Health   Financial Resource Strain: Not on file  Food Insecurity: Not on file  Transportation Needs: Not on file  Physical Activity: Not on file  Stress: Not on file  Social Connections: Not on file  Intimate Partner Violence: Not on file    Family History  Problem Relation Age of Onset   Hypertension Mother    Diabetes Mother    Stroke Mother     Past Surgical History:  Procedure Laterality Date   CESAREAN SECTION  08/31/2011   Procedure: CESAREAN SECTION;  Surgeon: Agnes Lawrence, MD;  Location: [facility];  Service: Gynecology;  Laterality: N/A;  Primary Cesarean section Baby Boy @ 1418   COLPOSCOPY  2010   LEEP     LIPOMA EXCISION Right 08/08/2021   Procedure: RIGHT FOREARM MASS EXCISION;  Surgeon: Sherilyn Cooter, MD;  Location: [facility];  Service: Orthopedics;  Laterality: Right;    ROS: Review of Systems Negative except as stated above  PHYSICAL EXAM: BP 114/73 (BP Location: Left Arm, Patient Position: Sitting, Cuff Size:  Normal)   Pulse 81   Temp 98.3 F (36.8 C)   Resp 16   Ht 5' 7.99" (1.727 m)   Wt 178 lb (80.7 kg)   SpO2 98%   BMI 27.07 kg/m   Physical Exam HENT:     Head: Normocephalic and atraumatic.     Right Ear: Tympanic membrane, ear canal and external ear normal.     Left Ear: Tympanic membrane, ear canal and external ear normal.     Nose: Nose normal.     Mouth/Throat:     Mouth: Mucous membranes are moist.     Pharynx: Oropharynx is clear.  Eyes:     Extraocular Movements: Extraocular movements intact.     Conjunctiva/sclera: Conjunctivae normal.     Pupils: Pupils are equal, round, and reactive to light.  Cardiovascular:     Rate and Rhythm: Normal rate and regular rhythm.      Pulses: Normal pulses.     Heart sounds: Normal heart sounds.  Pulmonary:     Effort: Pulmonary effort is normal.     Breath sounds: Normal breath sounds.  Chest:     Comments: Patient declined. Abdominal:     General: Bowel sounds are normal.     Palpations: Abdomen is soft.  Genitourinary:    Comments: Patient declined.  Musculoskeletal:        General: Normal range of motion.     Right shoulder: Normal.     Left shoulder: Normal.     Right upper arm: Normal.     Left upper arm: Normal.     Right elbow: Normal.     Left elbow: Normal.     Right forearm: Normal.     Left forearm: Normal.     Right wrist: Normal.     Left wrist: Normal.     Right hand: Normal.     Left hand: Normal.     Cervical back: Normal, normal range of motion and neck supple.     Thoracic back: Normal.     Lumbar back: Normal.     Right hip: Normal.     Left hip: Normal.     Right upper leg: Normal.     Left upper leg: Normal.     Right knee: Normal.     Left knee: Normal.     Right lower leg: Normal.     Left lower leg: Normal.     Right ankle: Normal.     Left ankle: Normal.     Right foot: Normal.     Left foot: Normal.  Skin:    General: Skin is warm and dry.     Capillary Refill: Capillary refill takes less than 2 seconds.  Neurological:     General: No focal deficit present.     Mental Status: She is alert and oriented to person, place, and time.  Psychiatric:        Mood and Affect: Mood normal.        Behavior: Behavior normal.    Results for orders placed or performed in visit on 06/23/22  POCT URINALYSIS DIP (CLINITEK)  Result Value Ref Range   Color, UA yellow yellow   Clarity, UA clear clear   Glucose, UA negative negative mg/dL   Bilirubin, UA negative negative   Ketones, POC UA negative negative mg/dL   Spec Grav, UA >=1.030 (A) 1.010 - 1.025   Blood, UA small (A) negative   pH, UA 6.0 5.0 - 8.0  POC PROTEIN,UA trace negative, trace   Urobilinogen, UA 2.0 (A)  0.2 or 1.0 E.U./dL   Nitrite, UA Negative Negative   Leukocytes, UA Negative Negative    ASSESSMENT AND PLAN: 1. Annual physical exam - Counseled on 150 minutes of exercise per week as tolerated, healthy eating (including decreased daily intake of saturated fats, cholesterol, added sugars, sodium), STI prevention, and routine healthcare maintenance.  2. Diabetes mellitus screening - Hemoglobin A1c to screen for pre-diabetes/diabetes. - Hemoglobin A1c  3. Screening cholesterol level - Lipid panel to screen for high cholesterol.  - Lipid Panel  4. Thyroid disorder screen - TSH to check thyroid function.  - TSH  5. Need for hepatitis C screening test - Hepatitis C antibody to screen for hepatitis C.  - Hepatitis C Antibody  6. Encounter for screening mammogram for malignant neoplasm of breast - Referral for breast cancer screening by mammogram.  - MM Digital Screening; Future  7. Routine screening for STI (sexually transmitted infection) - No urinary tract infection.  - Routine screening.  - Cervicovaginal ancillary only - POCT URINALYSIS DIP (CLINITEK)  8. HSV (herpes simplex virus) infection - Screening per patient request.  - HSV(herpes simplex vrs) 1+2 ab-IgG  9. Rash and nonspecific skin eruption - Triamcinolone ointment as prescribed. Counseled on medication adherence and adverse effects.  - Follow-up with primary provider as scheduled. - triamcinolone ointment (KENALOG) 0.5 %; Apply 1 Application topically 2 (two) times daily.  Dispense: 60 g; Refill: 1  10. Constipation, unspecified constipation type 11. Bloating - Referral to Gastroenterology for further evaluation and management.  - Ambulatory referral to Gastroenterology    Patient was given the opportunity to ask questions.  Patient verbalized understanding of the plan and was able to repeat key elements of the plan. Patient was given clear instructions to go to Emergency Department or return to medical  center if symptoms don't improve, worsen, or new problems develop.The patient verbalized understanding.   Orders Placed This Encounter  Procedures   MM Digital Screening   Hepatitis C Antibody   Hemoglobin A1c   TSH   Lipid Panel   HSV(herpes simplex vrs) 1+2 ab-IgG   Ambulatory referral to Gastroenterology   POCT URINALYSIS DIP (CLINITEK)     Requested Prescriptions   Signed Prescriptions Disp Refills   triamcinolone ointment (KENALOG) 0.5 % 60 g 1    Sig: Apply 1 Application topically 2 (two) times daily.    Return in about 1 year (around 06/24/2023) for Physical per patient preference.  [name], NP

## 2022-06-23 ENCOUNTER — Encounter: Payer: Self-pay | Admitting: Family

## 2022-06-23 ENCOUNTER — Other Ambulatory Visit (HOSPITAL_COMMUNITY)
Admission: RE | Admit: 2022-06-23 | Discharge: 2022-06-23 | Disposition: A | Discharge status: Home / Self Care | Payer: BC Managed Care – PPO | Source: Ambulatory Visit | Attending: Family Medicine | Admitting: Family Medicine

## 2022-06-23 ENCOUNTER — Ambulatory Visit (INDEPENDENT_AMBULATORY_CARE_PROVIDER_SITE_OTHER): Payer: BC Managed Care – PPO | Admitting: Family

## 2022-06-23 VITALS — BP 114/73 | HR 81 | Temp 98.3°F | Resp 16 | Ht 67.99 in | Wt 178.0 lb

## 2022-06-23 DIAGNOSIS — Z0000 Encounter for general adult medical examination without abnormal findings: Secondary | ICD-10-CM

## 2022-06-23 DIAGNOSIS — R21 Rash and other nonspecific skin eruption: Secondary | ICD-10-CM | POA: Diagnosis not present

## 2022-06-23 DIAGNOSIS — Z1231 Encounter for screening mammogram for malignant neoplasm of breast: Secondary | ICD-10-CM

## 2022-06-23 DIAGNOSIS — Z13220 Encounter for screening for lipoid disorders: Secondary | ICD-10-CM

## 2022-06-23 DIAGNOSIS — B009 Herpesviral infection, unspecified: Secondary | ICD-10-CM

## 2022-06-23 DIAGNOSIS — Z0001 Encounter for general adult medical examination with abnormal findings: Secondary | ICD-10-CM | POA: Diagnosis not present

## 2022-06-23 DIAGNOSIS — Z1159 Encounter for screening for other viral diseases: Secondary | ICD-10-CM | POA: Diagnosis not present

## 2022-06-23 DIAGNOSIS — Z1329 Encounter for screening for other suspected endocrine disorder: Secondary | ICD-10-CM

## 2022-06-23 DIAGNOSIS — K5900 Constipation, unspecified: Secondary | ICD-10-CM | POA: Diagnosis not present

## 2022-06-23 DIAGNOSIS — Z131 Encounter for screening for diabetes mellitus: Secondary | ICD-10-CM | POA: Diagnosis not present

## 2022-06-23 DIAGNOSIS — R140 Abdominal distension (gaseous): Secondary | ICD-10-CM | POA: Diagnosis not present

## 2022-06-23 DIAGNOSIS — Z113 Encounter for screening for infections with a predominantly sexual mode of transmission: Secondary | ICD-10-CM | POA: Insufficient documentation

## 2022-06-23 DIAGNOSIS — N760 Acute vaginitis: Secondary | ICD-10-CM | POA: Insufficient documentation

## 2022-06-23 LAB — POCT URINALYSIS DIP (CLINITEK)
Bilirubin, UA: NEGATIVE
Glucose, UA: NEGATIVE mg/dL
Ketones, POC UA: NEGATIVE mg/dL
Leukocytes, UA: NEGATIVE
Nitrite, UA: NEGATIVE
Spec Grav, UA: >=1.03 — AB (ref 1.010–1.025)
Urobilinogen, UA: 2 E.U./dL — AB
pH, UA: 6 (ref 5.0–8.0)

## 2022-06-23 MED ORDER — TRIAMCINOLONE ACETONIDE 0.5 % EX OINT: 1.0000 | TOPICAL_OINTMENT | Freq: Two times a day (BID) | CUTANEOUS | 1 refills | Status: DC

## 2022-06-23 NOTE — Patient Instructions (Signed)
Preventive Care 40-41 Years Old, Female Preventive care refers to lifestyle choices and visits with your health care provider that can promote health and wellness. Preventive care visits are also called wellness exams. What can I expect for my preventive care visit? Counseling Your health care provider may ask you questions about your: Medical history, including: Past medical problems. Family medical history. Pregnancy history. Current health, including: Menstrual cycle. Method of birth control. Emotional well-being. Home life and relationship well-being. Sexual activity and sexual health. Lifestyle, including: Alcohol, nicotine or tobacco, and drug use. Access to firearms. Diet, exercise, and sleep habits. Work and work environment. Sunscreen use. Safety issues such as seatbelt and bike helmet use. Physical exam Your health care provider will check your: Height and weight. These may be used to calculate your BMI (body mass index). BMI is a measurement that tells if you are at a healthy weight. Waist circumference. This measures the distance around your waistline. This measurement also tells if you are at a healthy weight and may help predict your risk of certain diseases, such as type 2 diabetes and high blood pressure. Heart rate and blood pressure. Body temperature. Skin for abnormal spots. What immunizations do I need?  Vaccines are usually given at various ages, according to a schedule. Your health care provider will recommend vaccines for you based on your age, medical history, and lifestyle or other factors, such as travel or where you work. What tests do I need? Screening Your health care provider may recommend screening tests for certain conditions. This may include: Lipid and cholesterol levels. Diabetes screening. This is done by checking your blood sugar (glucose) after you have not eaten for a while (fasting). Pelvic exam and Pap test. Hepatitis B test. Hepatitis C  test. HIV (human immunodeficiency virus) test. STI (sexually transmitted infection) testing, if you are at risk. Lung cancer screening. Colorectal cancer screening. Mammogram. Talk with your health care provider about when you should start having regular mammograms. This may depend on whether you have a family history of breast cancer. BRCA-related cancer screening. This may be done if you have a family history of breast, ovarian, tubal, or peritoneal cancers. Bone density scan. This is done to screen for osteoporosis. Talk with your health care provider about your test results, treatment options, and if necessary, the need for more tests. Follow these instructions at home: Eating and drinking  Eat a diet that includes fresh fruits and vegetables, whole grains, lean protein, and low-fat dairy products. Take vitamin and mineral supplements as recommended by your health care provider. Do not drink alcohol if: Your health care provider tells you not to drink. You are pregnant, may be pregnant, or are planning to become pregnant. If you drink alcohol: Limit how much you have to 0-1 drink a day. Know how much alcohol is in your drink. In the [place] one drink equals one 12 oz bottle of beer (355 mL), one 5 oz glass of wine (148 mL), or one 1 oz glass of hard liquor (44 mL). Lifestyle Brush your teeth every morning and night with fluoride toothpaste. Floss one time each day. Exercise for at least 30 minutes 5 or more days each week. Do not use any products that contain nicotine or tobacco. These products include cigarettes, chewing tobacco, and vaping devices, such as e-cigarettes. If you need help quitting, ask your health care provider. Do not use drugs. If you are sexually active, practice safe sex. Use a condom or other form of protection to   prevent STIs. If you do not wish to become pregnant, use a form of birth control. If you plan to become pregnant, see your health care provider for a  prepregnancy visit. Take aspirin only as told by your health care provider. Make sure that you understand how much to take and what form to take. Work with your health care provider to find out whether it is safe and beneficial for you to take aspirin daily. Find healthy ways to manage stress, such as: Meditation, yoga, or listening to music. Journaling. Talking to a trusted person. Spending time with friends and family. Minimize exposure to UV radiation to reduce your risk of skin cancer. Safety Always wear your seat belt while driving or riding in a vehicle. Do not drive: If you have been drinking alcohol. Do not ride with someone who has been drinking. When you are tired or distracted. While texting. If you have been using any mind-altering substances or drugs. Wear a helmet and other protective equipment during sports activities. If you have firearms in your house, make sure you follow all gun safety procedures. Seek help if you have been physically or sexually abused. What's next? Visit your health care provider once a year for an annual wellness visit. Ask your health care provider how often you should have your eyes and teeth checked. Stay up to date on all vaccines. This information is not intended to replace advice given to you by your health care provider. Make sure you discuss any questions you have with your health care provider. Document Revised: 04/09/2021 Document Reviewed: 04/09/2021 Elsevier Patient Education  [place].

## 2022-06-23 NOTE — Progress Notes (Signed)
.  Pt presents for annual physical exam   -referral to [facility] for constant bloating -small itchy bumps on hands and legs  -STD screening  -desires HSV retest

## 2022-06-24 ENCOUNTER — Other Ambulatory Visit: Payer: Self-pay | Admitting: Family

## 2022-06-24 DIAGNOSIS — B9689 Other specified bacterial agents as the cause of diseases classified elsewhere: Secondary | ICD-10-CM

## 2022-06-24 DIAGNOSIS — N760 Acute vaginitis: Secondary | ICD-10-CM | POA: Insufficient documentation

## 2022-06-24 HISTORY — DX: Other specified bacterial agents as the cause of diseases classified elsewhere: B96.89

## 2022-06-24 LAB — LIPID PANEL
Chol/HDL Ratio: 3.8 ratio (ref 0.0–4.4)
Cholesterol, Total: 195 mg/dL (ref 100–199)
HDL: 51 mg/dL (ref >39)
LDL Chol Calc (NIH): 125 mg/dL — ABNORMAL HIGH (ref 0–99)
Triglycerides: 108 mg/dL (ref 0–149)
VLDL Cholesterol Cal: 19 mg/dL (ref 5–40)

## 2022-06-24 LAB — CERVICOVAGINAL ANCILLARY ONLY
Bacterial Vaginitis (gardnerella): POSITIVE — AB
Candida Glabrata: NEGATIVE
Candida Vaginitis: NEGATIVE
Chlamydia: NEGATIVE
Comment: NEGATIVE
Comment: NEGATIVE
Comment: NEGATIVE
Comment: NEGATIVE
Comment: NEGATIVE
Comment: NORMAL
Neisseria Gonorrhea: NEGATIVE
Trichomonas: NEGATIVE

## 2022-06-24 LAB — HSV(HERPES SIMPLEX VRS) I + II AB-IGG
HSV 1 Glycoprotein G Ab, IgG: 24.3 index — ABNORMAL HIGH (ref 0.00–0.90)
HSV 2 IgG, Type Spec: 4.73 index — ABNORMAL HIGH (ref 0.00–0.90)

## 2022-06-24 LAB — HEMOGLOBIN A1C
Est. average glucose Bld gHb Est-mCnc: 97 mg/dL
Hgb A1c MFr Bld: 5 % (ref 4.8–5.6)

## 2022-06-24 LAB — HSV-2 IGG SUPPLEMENTAL TEST

## 2022-06-24 LAB — TSH: TSH: 1.04 u[IU]/mL (ref 0.450–4.500)

## 2022-06-24 LAB — HEPATITIS C ANTIBODY: Hep C Virus Ab: NONREACTIVE

## 2022-06-24 MED ORDER — METRONIDAZOLE 500 MG PO TABS: 500.0000 mg | ORAL_TABLET | Freq: Two times a day (BID) | ORAL | 0 refills | Status: AC

## 2022-07-13 ENCOUNTER — Telehealth: Payer: BC Managed Care – PPO | Admitting: Physician Assistant

## 2022-07-13 DIAGNOSIS — K529 Noninfective gastroenteritis and colitis, unspecified: Secondary | ICD-10-CM | POA: Diagnosis not present

## 2022-07-14 MED ORDER — ONDANSETRON 4 MG PO TBDP: 4.0000 mg | ORAL_TABLET | Freq: Three times a day (TID) | ORAL | 0 refills | Status: DC | PRN

## 2022-07-14 NOTE — Progress Notes (Signed)
I have spent 5 minutes in review of e-visit questionnaire, review and updating patient chart, medical decision making and response to patient.   Babara [place] Denys Labree, PA-C    

## 2022-07-14 NOTE — Progress Notes (Signed)

## 2022-07-17 ENCOUNTER — Telehealth: Payer: Self-pay

## 2022-07-17 NOTE — Telephone Encounter (Signed)
Copied from [place] [id number]. Topic: General - Other >> Jul 17, 2022  2:37 PM Leitha Schuller wrote: Pt inquiring if a work excuse can be uploaded to [employer] had an e-vist on 9-18 and returned to work on 9-21  Please assist further   Pt was not seen by her pcp Durene Fruits, NP she has been out of office 09/18/-09/24 unable to process work note for patient.

## 2022-09-04 ENCOUNTER — Encounter: Payer: Self-pay | Admitting: Family

## 2022-09-05 NOTE — Progress Notes (Signed)
Patient ID: [name], female    DOB: [birth year]  MRN: [id number]  CC: No chief complaint on file.   Subjective: [name] is a 41 y.o. female who presents for  Her concerns today include:   recurrent BV  Patient message  think I have another Bacteria vaginosis infection how do I go about getting seen for medicine. I don't know why I continue to get BV.   Patient Active Problem List   Diagnosis Date Noted   BV (bacterial vaginosis) 06/24/2022   Candida vaginitis 05/06/2022   HSV (herpes simplex virus) infection 03/11/2022   Lipoma of forearm 07/24/2021   Insomnia 06/11/2021   Acute right-sided low back pain with right-sided sciatica 12/04/2020   Anemia 01/29/2020     Current Outpatient Medications on File Prior to Visit  Medication Sig Dispense Refill   ondansetron (ZOFRAN-ODT) 4 MG disintegrating tablet Take 1 tablet (4 mg total) by mouth every 8 (eight) hours as needed for nausea or vomiting. 20 tablet 0   traZODone (DESYREL) 50 MG tablet Take 1 tablet (50 mg total) by mouth at bedtime. 30 tablet 0   triamcinolone ointment (KENALOG) 0.5 % Apply 1 Application topically 2 (two) times daily. 60 g 1   No current facility-administered medications on file prior to visit.    No Known Allergies  Social History   Socioeconomic History   Marital status: Single    Spouse name: Not on file   Number of children: Not on file   Years of education: Not on file   Highest education level: Not on file  Occupational History   Not on file  Tobacco Use   Smoking status: Former    Packs/day: 0.25    Years: 5.00    Total pack years: 1.25    Types: Cigarettes    Passive exposure: Past   Smokeless tobacco: Never   Tobacco comments:    quit with + preg  Vaping Use   Vaping Use: Never used  Substance and Sexual Activity   Alcohol use: Yes    Alcohol/week: 1.0 standard drink of alcohol    Types: 1 Standard drinks or equivalent per week    Comment: socially    Drug use: No   Sexual activity: Not on file    Comment: states no more (wk ago was last)- caught him with someone else  Other Topics Concern   Not on file  Social History Narrative   Not on file   Social Determinants of Health   Financial Resource Strain: Not on file  Food Insecurity: Not on file  Transportation Needs: Not on file  Physical Activity: Not on file  Stress: Not on file  Social Connections: Not on file  Intimate Partner Violence: Not on file    Family History  Problem Relation Age of Onset   Hypertension Mother    Diabetes Mother    Stroke Mother     Past Surgical History:  Procedure Laterality Date   CESAREAN SECTION  08/31/2011   Procedure: CESAREAN SECTION;  Surgeon: Agnes Lawrence, MD;  Location: [facility];  Service: Gynecology;  Laterality: N/A;  Primary Cesarean section Baby Boy @ 1418   COLPOSCOPY  2010   LEEP     LIPOMA EXCISION Right 08/08/2021   Procedure: RIGHT FOREARM MASS EXCISION;  Surgeon: Sherilyn Cooter, MD;  Location: [facility];  Service: Orthopedics;  Laterality: Right;    ROS: Review of Systems Negative except as stated above  PHYSICAL  EXAM: There were no vitals taken for this visit.  Physical Exam  {female adult master:310786} {female adult master:310785}     Latest Ref Rng & Units 02/11/2022   12:38 PM 02/11/2022   12:10 PM 06/11/2021    3:55 PM  CMP  Glucose 70 - 99 mg/dL 95  97  85   BUN 6 - 20 mg/dL '9  8  8   '$ Creatinine 0.44 - 1.00 mg/dL 0.70  0.78  0.71   Sodium 135 - 145 mmol/L 137  137  139   Potassium 3.5 - 5.1 mmol/L 3.8  4.0  3.7   Chloride 98 - 111 mmol/L 103  105  106   CO2 22 - 32 mmol/L  27  22   Calcium 8.9 - 10.3 mg/dL  9.0  8.8   Total Protein 6.5 - 8.1 g/dL  7.1  6.6   Total Bilirubin 0.3 - 1.2 mg/dL  0.7  0.2   Alkaline Phos 38 - 126 U/L  55  61   AST 15 - 41 U/L  14  12   ALT 0 - 44 U/L  9  8    Lipid Panel     Component Value Date/Time   CHOL 195 06/23/2022 1644   TRIG 108  06/23/2022 1644   HDL 51 06/23/2022 1644   CHOLHDL 3.8 06/23/2022 1644   LDLCALC 125 (H) 06/23/2022 1644    CBC    Component Value Date/Time   WBC 7.9 02/11/2022 1210   RBC 4.08 02/11/2022 1210   HGB 13.6 02/11/2022 1238   HGB 12.9 06/11/2021 1555   HCT 40.0 02/11/2022 1238   HCT 38.9 06/11/2021 1555   PLT 226 02/11/2022 1210   PLT 225 06/11/2021 1555   MCV 96.3 02/11/2022 1210   MCV 93 06/11/2021 1555   MCH 31.1 02/11/2022 1210   MCHC 32.3 02/11/2022 1210   RDW 12.7 02/11/2022 1210   RDW 11.9 06/11/2021 1555   LYMPHSABS 2.5 02/11/2022 1210   LYMPHSABS 2.2 01/25/2020 1356   MONOABS 0.2 02/11/2022 1210   EOSABS 0.0 02/11/2022 1210   EOSABS 0.0 01/25/2020 1356   BASOSABS 0.0 02/11/2022 1210   BASOSABS 0.0 01/25/2020 1356    ASSESSMENT AND PLAN:  There are no diagnoses linked to this encounter.   Patient was given the opportunity to ask questions.  Patient verbalized understanding of the plan and was able to repeat key elements of the plan. Patient was given clear instructions to go to Emergency Department or return to medical center if symptoms don't improve, worsen, or new problems develop.The patient verbalized understanding.   No orders of the defined types were placed in this encounter.    Requested Prescriptions    No prescriptions requested or ordered in this encounter    No follow-ups on file.  [name], NP

## 2022-09-07 ENCOUNTER — Encounter: Payer: BC Managed Care – PPO | Admitting: Family

## 2022-09-07 DIAGNOSIS — B9689 Other specified bacterial agents as the cause of diseases classified elsewhere: Secondary | ICD-10-CM

## 2022-09-09 NOTE — Progress Notes (Signed)
Patient ID: [name], female    DOB: [birth year]  MRN: [id number]  CC: Recurrent BV  Subjective: [name] is a 41 y.o. female who presents for recurrent BV.  Her concerns today include:  - Endorses recurrent bacterial vaginosis. Mild odor began last week. Denies additional symptoms. States never heard from previous Gynecology referral. - Low back pain with sciatica persisting. Working long hours standing on concrete floors. No additional symptoms. Denies recent trauma/injury. - Needs referral to podiatrist for bilateral foot pain.   Patient Active Problem List   Diagnosis Date Noted   BV (bacterial vaginosis) 06/24/2022   Candida vaginitis 05/06/2022   HSV (herpes simplex virus) infection 03/11/2022   Lipoma of forearm 07/24/2021   Insomnia 06/11/2021   Acute right-sided low back pain with right-sided sciatica 12/04/2020   Anemia 01/29/2020     Current Outpatient Medications on File Prior to Visit  Medication Sig Dispense Refill   ondansetron (ZOFRAN-ODT) 4 MG disintegrating tablet Take 1 tablet (4 mg total) by mouth every 8 (eight) hours as needed for nausea or vomiting. 20 tablet 0   traZODone (DESYREL) 50 MG tablet Take 1 tablet (50 mg total) by mouth at bedtime. 30 tablet 0   triamcinolone ointment (KENALOG) 0.5 % Apply 1 Application topically 2 (two) times daily. 60 g 1   No current facility-administered medications on file prior to visit.    No Known Allergies  Social History   Socioeconomic History   Marital status: Single    Spouse name: Not on file   Number of children: Not on file   Years of education: Not on file   Highest education level: Not on file  Occupational History   Not on file  Tobacco Use   Smoking status: Former    Packs/day: 0.25    Years: 5.00    Total pack years: 1.25    Types: Cigarettes    Passive exposure: Past   Smokeless tobacco: Never   Tobacco comments:    quit with + preg  Vaping Use   Vaping Use: Never used   Substance and Sexual Activity   Alcohol use: Yes    Alcohol/week: 1.0 standard drink of alcohol    Types: 1 Standard drinks or equivalent per week    Comment: socially   Drug use: No   Sexual activity: Not on file    Comment: states no more (wk ago was last)- caught him with someone else  Other Topics Concern   Not on file  Social History Narrative   Not on file   Social Determinants of Health   Financial Resource Strain: Not on file  Food Insecurity: Not on file  Transportation Needs: Not on file  Physical Activity: Not on file  Stress: Not on file  Social Connections: Not on file  Intimate Partner Violence: Not on file    Family History  Problem Relation Age of Onset   Hypertension Mother    Diabetes Mother    Stroke Mother     Past Surgical History:  Procedure Laterality Date   CESAREAN SECTION  08/31/2011   Procedure: CESAREAN SECTION;  Surgeon: Agnes Lawrence, MD;  Location: [facility];  Service: Gynecology;  Laterality: N/A;  Primary Cesarean section Baby Boy @ 1418   COLPOSCOPY  2010   LEEP     LIPOMA EXCISION Right 08/08/2021   Procedure: RIGHT FOREARM MASS EXCISION;  Surgeon: Sherilyn Cooter, MD;  Location: [facility];  Service: Orthopedics;  Laterality:  Right;    ROS: Review of Systems Negative except as stated above  PHYSICAL EXAM: BP 100/69 (BP Location: Left Arm, Patient Position: Sitting, Cuff Size: Normal)   Pulse 90   Temp 98.3 F (36.8 C)   Resp 16   Ht 5' 7.99" (1.727 m)   Wt 183 lb (83 kg)   SpO2 98%   BMI 27.83 kg/m   Physical Exam HENT:     Head: Normocephalic and atraumatic.  Eyes:     Extraocular Movements: Extraocular movements intact.     Conjunctiva/sclera: Conjunctivae normal.     Pupils: Pupils are equal, round, and reactive to light.  Cardiovascular:     Rate and Rhythm: Normal rate and regular rhythm.     Pulses: Normal pulses.     Heart sounds: Normal heart sounds.  Pulmonary:     Effort: Pulmonary  effort is normal.     Breath sounds: Normal breath sounds.  Musculoskeletal:     Cervical back: Normal range of motion and neck supple.  Neurological:     General: No focal deficit present.     Mental Status: She is alert and oriented to person, place, and time.  Psychiatric:        Mood and Affect: Mood normal.        Behavior: Behavior normal.    Results for orders placed or performed in visit on 09/11/22  POCT URINALYSIS DIP (CLINITEK)  Result Value Ref Range   Color, UA yellow yellow   Clarity, UA cloudy (A) clear   Glucose, UA negative negative mg/dL   Bilirubin, UA negative negative   Ketones, POC UA negative negative mg/dL   Spec Grav, UA >=1.030 (A) 1.010 - 1.025   Blood, UA trace-intact (A) negative   pH, UA 5.5 5.0 - 8.0   POC PROTEIN,UA =30 (A) negative, trace   Urobilinogen, UA 1.0 0.2 or 1.0 E.U./dL   Nitrite, UA Negative Negative   Leukocytes, UA Negative Negative     ASSESSMENT AND PLAN: 1. BV (bacterial vaginosis) - Recurrent BV. - No urinary tract infection. - Cervicovaginal self-swab to screen for chlamydia, gonorrhea, trichomonas, bacterial vaginitis, and candida vaginitis. - Referral to Gynecology for further evaluation and management.  - Cervicovaginal ancillary only - POCT URINALYSIS DIP (CLINITEK) - Ambulatory referral to Gynecology  2. Chronic low back pain with sciatica, sciatica laterality unspecified, unspecified back pain laterality - Ketorolac injection administered in office. - Referral to Orthopedic Surgery for further evaluation and management.  - ketorolac (TORADOL) injection 60 mg - Ambulatory referral to Orthopedic Surgery  3. Pain in both feet - Referral to Podiatry for further evaluation and management.  - Ambulatory referral to Podiatry   Patient was given the opportunity to ask questions.  Patient verbalized understanding of the plan and was able to repeat key elements of the plan. Patient was given clear instructions to go to  Emergency Department or return to medical center if symptoms don't improve, worsen, or new problems develop.The patient verbalized understanding.   Orders Placed This Encounter  Procedures   Ambulatory referral to Orthopedic Surgery   Ambulatory referral to Gynecology   Ambulatory referral to Podiatry   POCT URINALYSIS DIP (CLINITEK)   Follow-up with primary provider as scheduled.   [name], NP

## 2022-09-11 ENCOUNTER — Telehealth: Payer: Self-pay | Admitting: Family

## 2022-09-11 ENCOUNTER — Other Ambulatory Visit (HOSPITAL_COMMUNITY)
Admission: RE | Admit: 2022-09-11 | Discharge: 2022-09-11 | Disposition: A | Discharge status: Home / Self Care | Payer: BC Managed Care – PPO | Source: Ambulatory Visit | Attending: Family | Admitting: Family

## 2022-09-11 ENCOUNTER — Ambulatory Visit (INDEPENDENT_AMBULATORY_CARE_PROVIDER_SITE_OTHER): Payer: BC Managed Care – PPO | Admitting: Family

## 2022-09-11 VITALS — BP 100/69 | HR 90 | Temp 98.3°F | Resp 16 | Ht 67.99 in | Wt 183.0 lb

## 2022-09-11 DIAGNOSIS — B9689 Other specified bacterial agents as the cause of diseases classified elsewhere: Secondary | ICD-10-CM | POA: Diagnosis not present

## 2022-09-11 DIAGNOSIS — M79671 Pain in right foot: Secondary | ICD-10-CM

## 2022-09-11 DIAGNOSIS — M5440 Lumbago with sciatica, unspecified side: Secondary | ICD-10-CM | POA: Diagnosis not present

## 2022-09-11 DIAGNOSIS — N760 Acute vaginitis: Secondary | ICD-10-CM | POA: Insufficient documentation

## 2022-09-11 DIAGNOSIS — M79672 Pain in left foot: Secondary | ICD-10-CM

## 2022-09-11 DIAGNOSIS — G8929 Other chronic pain: Secondary | ICD-10-CM

## 2022-09-11 LAB — POCT URINALYSIS DIP (CLINITEK)
Bilirubin, UA: NEGATIVE
Glucose, UA: NEGATIVE mg/dL
Ketones, POC UA: NEGATIVE mg/dL
Leukocytes, UA: NEGATIVE
Nitrite, UA: NEGATIVE
POC PROTEIN,UA: 30 — AB
Spec Grav, UA: >=1.03 — AB (ref 1.010–1.025)
Urobilinogen, UA: 1 E.U./dL
pH, UA: 5.5 (ref 5.0–8.0)

## 2022-09-11 MED ORDER — KETOROLAC TROMETHAMINE 60 MG/2ML IM SOLN
60.0000 mg | Freq: Once | INTRAMUSCULAR | Status: AC
  Administered 2022-09-11: 60 mg via INTRAMUSCULAR

## 2022-09-11 NOTE — Patient Instructions (Signed)

## 2022-09-11 NOTE — Progress Notes (Signed)
Pt presents for recurrent bacterial vaginosis  -started last week -mild odor

## 2022-09-14 ENCOUNTER — Other Ambulatory Visit: Payer: Self-pay | Admitting: Family

## 2022-09-14 DIAGNOSIS — N760 Acute vaginitis: Secondary | ICD-10-CM

## 2022-09-14 LAB — CERVICOVAGINAL ANCILLARY ONLY
Bacterial Vaginitis (gardnerella): POSITIVE — AB
Candida Glabrata: NEGATIVE
Candida Vaginitis: NEGATIVE
Chlamydia: NEGATIVE
Comment: NEGATIVE
Comment: NEGATIVE
Comment: NEGATIVE
Comment: NEGATIVE
Comment: NEGATIVE
Comment: NORMAL
Neisseria Gonorrhea: NEGATIVE
Trichomonas: NEGATIVE

## 2022-09-14 MED ORDER — METRONIDAZOLE 500 MG PO TABS: 500.0000 mg | ORAL_TABLET | Freq: Two times a day (BID) | ORAL | 0 refills | Status: AC

## 2022-09-21 ENCOUNTER — Other Ambulatory Visit: Payer: Self-pay

## 2022-09-21 DIAGNOSIS — B009 Herpesviral infection, unspecified: Secondary | ICD-10-CM

## 2022-09-21 MED ORDER — VALACYCLOVIR HCL 1 G PO TABS: 1000.0000 mg | ORAL_TABLET | Freq: Two times a day (BID) | ORAL | 0 refills | Status: AC

## 2022-09-24 ENCOUNTER — Ambulatory Visit (INDEPENDENT_AMBULATORY_CARE_PROVIDER_SITE_OTHER): Payer: BC Managed Care – PPO | Admitting: Radiology

## 2022-09-24 ENCOUNTER — Encounter: Payer: Self-pay | Admitting: Radiology

## 2022-09-24 VITALS — BP 108/72 | Ht 68.0 in | Wt 184.0 lb

## 2022-09-24 DIAGNOSIS — B9689 Other specified bacterial agents as the cause of diseases classified elsewhere: Secondary | ICD-10-CM

## 2022-09-24 DIAGNOSIS — N760 Acute vaginitis: Secondary | ICD-10-CM

## 2022-09-24 NOTE — Progress Notes (Signed)
      Subjective: [name] is a 41 y.o. female who complains of recurrent BV for the past year. Began after becoming sexually active with her current partner. They are using condoms now. No other symptoms besides discharge and odor. Currently on flagyl, has 3 tabs left.    Review of Systems  All other systems reviewed and are negative.   Past Medical History:  Diagnosis Date   Abnormal Pap smear    Anemia    Chlamydia    Gonorrhea    Headache(784.0)    Herpes simplex type 1 infection    Herpes simplex type 2 infection    HPV (human papilloma virus) infection    Trichomonas    Urinary tract infection       Objective:  Today's Vitals   09/24/22 1450  BP: 108/72  Weight: 184 lb (83.5 kg)  Height: '5\' 8"'$  (1.727 m)   Body mass index is 27.98 kg/m.   -General: no acute distress -Vulva: without lesions or discharge -Vagina: discharge present, aptima swab obtained -Cervix: no lesion or discharge, no CMT -Perineum: no lesions -Uterus: Mobile, non tender -Adnexa: no masses or tenderness    Chaperone offered and declined.  Assessment:/Plan:   1. BV (bacterial vaginosis)  - SureSwab Advanced Vaginitis Plus,TMA - Mycoplasma / ureaplasma culture    Will contact patient with results of testing completed today. Avoid intercourse until symptoms are resolved. Safe sex encouraged. Avoid the use of soaps or perfumed products in the peri area. Avoid tub baths and sitting in sweaty or wet clothing for prolonged periods of time.

## 2022-09-25 LAB — SURESWAB® ADVANCED VAGINITIS PLUS,TMA
C. trachomatis RNA, TMA: NOT DETECTED
CANDIDA SPECIES: NOT DETECTED
Candida glabrata: NOT DETECTED
N. gonorrhoeae RNA, TMA: NOT DETECTED
SURESWAB(R) ADV BACTERIAL VAGINOSIS(BV),TMA: POSITIVE — AB
TRICHOMONAS VAGINALIS (TV),TMA: NOT DETECTED

## 2022-09-29 ENCOUNTER — Ambulatory Visit: Payer: Self-pay

## 2022-09-29 ENCOUNTER — Encounter: Payer: Self-pay | Admitting: Surgery

## 2022-09-29 ENCOUNTER — Ambulatory Visit (INDEPENDENT_AMBULATORY_CARE_PROVIDER_SITE_OTHER): Payer: BC Managed Care – PPO | Admitting: Surgery

## 2022-09-29 ENCOUNTER — Other Ambulatory Visit: Payer: Self-pay | Admitting: *Deleted

## 2022-09-29 DIAGNOSIS — G8929 Other chronic pain: Secondary | ICD-10-CM | POA: Diagnosis not present

## 2022-09-29 DIAGNOSIS — M5441 Lumbago with sciatica, right side: Secondary | ICD-10-CM | POA: Diagnosis not present

## 2022-09-29 DIAGNOSIS — M4726 Other spondylosis with radiculopathy, lumbar region: Secondary | ICD-10-CM | POA: Diagnosis not present

## 2022-09-29 DIAGNOSIS — M5442 Lumbago with sciatica, left side: Secondary | ICD-10-CM | POA: Diagnosis not present

## 2022-09-29 MED ORDER — METRONIDAZOLE 500 MG PO TABS: 500.0000 mg | ORAL_TABLET | Freq: Two times a day (BID) | ORAL | 0 refills | Status: DC

## 2022-09-29 MED ORDER — METHYLPREDNISOLONE 4 MG PO TABS: ORAL_TABLET | ORAL | 0 refills | Status: DC

## 2022-09-29 NOTE — Progress Notes (Signed)
Office Visit Note   Patient: [name]           Date of Birth: 06-04-1981           MRN: [id number] Visit Date: 09/29/2022              Requested by: [name], NP [address],  [place] [address] PCP: [name], NP   Assessment & Plan: Visit Diagnoses:  1. Chronic bilateral low back pain with bilateral sciatica   2. Other spondylosis with radiculopathy, lumbar region     Plan: At this point recommend conservative treatment.  I sent in prescription for Medrol Dosepak 6-day taper to be taken as directed.  Will also start formal PT.  Follow-up with Dr. Lorin Mercy in 5 weeks for recheck.  He will decide at that time as to whether or not lumbar MRI scan is indicated.  All questions answered.  Follow-Up Instructions: Return in about 5 weeks (around 11/03/2022) for With Dr. Lorin Mercy for recheck lumbar.   Orders:  Orders Placed This Encounter  Procedures   XR Lumbar Spine 2-3 Views   Ambulatory referral to Physical Therapy   Meds ordered this encounter  Medications   methylPREDNISolone (MEDROL) 4 MG tablet    Sig: 6 day taper be taken as directed    Dispense:  21 tablet    Refill:  0      Procedures: No procedures performed   Clinical Data: No additional findings.   Subjective: Chief Complaint  Patient presents with   Lower Back - Pain    HPI 41 year old white female who is new patient to clinic comes in with complaints of low back pain and right lower extremity radiculopathy.  Patient states she has had low back pain off and on for several years.  She has been followed by her PCP for this.  States that pain worse the last 2 months.  No injury.  States that she recently had IM injections at her primary care provider's office that did give temporary relief.  Pain intermittently radiates down her right leg to her foot.  Has not had formal PT. Review of Systems No current complaints of cardiopulmonary GI/GU issues  Objective: Vital Signs:  LMP 09/21/2022 (Exact Date)   Physical Exam HENT:     Head: Normocephalic and atraumatic.  Eyes:     Extraocular Movements: Extraocular movements intact.  Musculoskeletal:     Comments: Gait is normal.  Mild right lumbar paraspinal tenderness.  Mild to moderate right greater than left-sided notch tenderness.  Negative logroll bilateral hips.  Negative straight leg raise.  No focal motor deficits.  Neurological:     Mental Status: She is alert.  Psychiatric:        Mood and Affect: Mood normal.     Ortho Exam  Specialty Comments:  No specialty comments available.  Imaging: No results found.   PMFS History: Patient Active Problem List   Diagnosis Date Noted   BV (bacterial vaginosis) 06/24/2022   Candida vaginitis 05/06/2022   HSV (herpes simplex virus) infection 03/11/2022   Lipoma of forearm 07/24/2021   Insomnia 06/11/2021   Acute right-sided low back pain with right-sided sciatica 12/04/2020   Anemia 01/29/2020   Past Medical History:  Diagnosis Date   Abnormal Pap smear    Anemia    Chlamydia    Gonorrhea    Headache(784.0)    Herpes simplex type 1 infection    Herpes simplex type 2  infection    HPV (human papilloma virus) infection    Trichomonas    Urinary tract infection     Family History  Problem Relation Age of Onset   Hypertension Mother    Diabetes Mother    Stroke Mother    Heart disease Brother    Heart attack Brother 30    Past Surgical History:  Procedure Laterality Date   CESAREAN SECTION  08/31/2011   Procedure: CESAREAN SECTION;  Surgeon: Agnes Lawrence, MD;  Location: [facility];  Service: Gynecology;  Laterality: N/A;  Primary Cesarean section Baby Boy @ 1418   COLPOSCOPY  2010   LEEP     LIPOMA EXCISION Right 08/08/2021   Procedure: RIGHT FOREARM MASS EXCISION;  Surgeon: Sherilyn Cooter, MD;  Location: [facility];  Service: Orthopedics;  Laterality: Right;   Social History   Occupational History   Not on file   Tobacco Use   Smoking status: Former    Packs/day: 0.25    Years: 5.00    Total pack years: 1.25    Types: Cigarettes    Passive exposure: Past   Smokeless tobacco: Never   Tobacco comments:    quit with + preg  Vaping Use   Vaping Use: Never used  Substance and Sexual Activity   Alcohol use: Yes    Alcohol/week: 1.0 standard drink of alcohol    Types: 1 Standard drinks or equivalent per week    Comment: rare   Drug use: No   Sexual activity: Yes    Partners: Male    Birth control/protection: Condom    Comment: menarche 41yo, sexual debut 41yo

## 2022-10-01 LAB — MYCOPLASMA / UREAPLASMA CULTURE

## 2022-10-07 NOTE — Therapy (Signed)
OUTPATIENT PHYSICAL THERAPY EVALUATION   Patient Name: [name] MRN: [id number] DOB:06-26-1981, 41 y.o., female Today's Date: 10/08/2022  END OF SESSION:  PT End of Session - 10/08/22 1634     Visit Number 1    Number of Visits 9    Date for PT Re-Evaluation 12/03/22    Authorization Type [employer] / [employer] [place] MCD    PT Start Time 1700    PT Stop Time 1745    PT Time Calculation (min) 45 min    Activity Tolerance Patient tolerated treatment well    Behavior During Therapy WFL for tasks assessed/performed             Past Medical History:  Diagnosis Date   Abnormal Pap smear    Anemia    Chlamydia    Gonorrhea    Headache(784.0)    Herpes simplex type 1 infection    Herpes simplex type 2 infection    HPV (human papilloma virus) infection    Trichomonas    Urinary tract infection    Past Surgical History:  Procedure Laterality Date   CESAREAN SECTION  08/31/2011   Procedure: CESAREAN SECTION;  Surgeon: Agnes Lawrence, MD;  Location: [facility];  Service: Gynecology;  Laterality: N/A;  Primary Cesarean section Baby Boy @ 1418   COLPOSCOPY  2010   LEEP     LIPOMA EXCISION Right 08/08/2021   Procedure: RIGHT FOREARM MASS EXCISION;  Surgeon: Sherilyn Cooter, MD;  Location: [facility];  Service: Orthopedics;  Laterality: Right;   Patient Active Problem List   Diagnosis Date Noted   BV (bacterial vaginosis) 06/24/2022   Candida vaginitis 05/06/2022   HSV (herpes simplex virus) infection 03/11/2022   Lipoma of forearm 07/24/2021   Insomnia 06/11/2021   Acute right-sided low back pain with right-sided sciatica 12/04/2020   Anemia 01/29/2020    PCP: [name], NP  REFERRING PROVIDER: Lanae Crumbly, PA-C  REFERRING DIAG: Chronic bilateral low back pain with bilateral sciatica; Other spondylosis with radiculopathy, lumbar region  Rationale for Evaluation and Treatment: Rehabilitation  THERAPY DIAG:  Other low back pain  Muscle  weakness (generalized)  ONSET DATE: "a long time"   SUBJECTIVE:        SUBJECTIVE STATEMENT: Patient reports she has been having problems with her lower back and shooting pain down her right leg to her foot. Ongoing for "a long time" and now there pain is becoming unbearable. She denies a mechanism that started the pain. She does stand on concrete all day and goes up/down stairs at work. If she stands for a really long time then her right leg could go numb. She did take a medrol dosepack and that has been helping.   PERTINENT HISTORY:  None  PAIN:  Are you having pain? Yes:  NPRS scale: 4/10 Pain location: Lower back, shooting pain right leg Pain description: Cramp, shooting Aggravating factors: Sitting or standing extended periods, lying on back, lifting, stairs Relieving factors: Medication, heat, leaning forward  PRECAUTIONS: None  WEIGHT BEARING RESTRICTIONS: No  FALLS:  Has patient fallen in last 6 months? No  OCCUPATION: Standing on concrete and going up/down stairs  PLOF: Independent  PATIENT GOALS: Pain relief and improve standing ability at work   OBJECTIVE:  DIAGNOSTIC FINDINGS:  X-ray 09/29/2022  PATIENT SURVEYS:  FOTO 57% functional status  SCREENING FOR RED FLAGS: Negative  COGNITION: Overall cognitive status: Within functional limits for tasks assessed     SENSATION: [facility]  MUSCLE  LENGTH: Bilateral hamstring, hip flexor/quad, and piriformis tightness  POSTURE:  Increased lumbar lordosis  PALPATION: Tender to palpation bilateral lumbar paraspinals and gluteals  LUMBAR ROM:   AROM eval  Flexion 75%  Extension WFL  Right lateral flexion WFL  Left lateral flexion WFL  Right rotation WFL  Left rotation [facility]    Patient reports pain reduction with extension  LOWER EXTREMITY ROM:      LE ROM grossly WFL  LOWER EXTREMITY MMT:    MMT Right eval Left eval  Hip flexion 4 4  Hip extension 3+ 3+  Hip abduction 4- 4  Hip adduction    Hip  internal rotation    Hip external rotation    Knee flexion 5 5  Knee extension 5 5  Ankle dorsiflexion 5 5  Ankle plantarflexion    Ankle inversion    Ankle eversion 5 5   (Blank rows = not tested)  LUMBAR SPECIAL TESTS:  Straight leg raise test: Negative  FUNCTIONAL TESTS:  DLLT: pain onset at approximately 60 deg without significant difficulty maintaining neutral lumbar spine  GAIT: Assistive device utilized: None Level of assistance: Complete Independence Comments: Slightly antalgic on right   TODAY'S TREATMENT:     [facility] Adult PT Treatment:                                                DATE: 10/07/2022 Therapeutic Exercise: Piriformis stretch 2 x 30 sec each Modified thomas stretch x 60 sec each  Longsitting hamstring stretch 2 x 30 sec Bridge x 10 Clamshell with red x 10  PATIENT EDUCATION:  Education details: Exam findings, POC, HEP Person educated: Patient Education method: Explanation, Demonstration, Tactile cues, Verbal cues, and Handouts Education comprehension: verbalized understanding, returned demonstration, verbal cues required, tactile cues required, and needs further education  HOME EXERCISE PROGRAM: Access Code: [email or web address]    ASSESSMENT: CLINICAL IMPRESSION: Patient is a 41 y.o. female who was seen today for physical therapy evaluation and treatment for chronic low back and right sided leg pain. Her right leg symptoms do seem to be radicular with extension preference. She also exhibits gross core and hip strength deficits with increased low back pain while testing likely being a main contributor to her symptoms with maintaining lumbar postural control. She also demonstrates gross flexibility deficits and was provided exercises to address her limitations.    OBJECTIVE IMPAIRMENTS: decreased activity tolerance, decreased ROM, decreased strength, impaired flexibility, improper body mechanics, postural dysfunction, and pain.   ACTIVITY LIMITATIONS: carrying,  lifting, bending, sitting, standing, squatting, stairs, and locomotion level  PARTICIPATION LIMITATIONS: meal prep, cleaning, community activity, and occupation  PERSONAL FACTORS: Fitness, Past/current experiences, and Time since onset of injury/illness/exacerbation are also affecting patient's functional outcome.   REHAB POTENTIAL: Good  CLINICAL DECISION MAKING: Stable/uncomplicated  EVALUATION COMPLEXITY: Low   GOALS: Goals reviewed with patient? Yes  SHORT TERM GOALS: Target date: 11/05/2022  Patient will be I with initial HEP in order to progress with therapy. Baseline: HEP provided at eval Goal status: INITIAL  2.  PT will review FOTO with patient by 3rd visit in order to understand expected progress and outcome with therapy. Baseline: FOTO assessed at eval Goal status: INITIAL  3.  Patient will report low back pain level </ 2/10 in order to reduce her functional limitations at work Baseline: 4/10 Goal status: INITIAL  LONG TERM GOALS: Target date: 12/03/2022  Patient will be I with final HEP to maintain progress from PT. Baseline: HEP provided at eval Goal status: INITIAL  2.  Patient will report >/= 70% status on FOTO to indicate improved functional ability. Baseline: 57% functional status Goal status: INITIAL  3.  Patient will demonstrate hip and core strength grossly >/= 4/5 MMT in order to improve standing tolerance at work Baseline: gross strength deficit of hip and core Goal status: INITIAL  4.  Patient will report no limitations with sitting or standing in order to reduce functional limitations Baseline: patient reports increased pain and limitation with sitting or standings Goal status: INITIAL   PLAN: PT FREQUENCY: 1x/week  PT DURATION: 8 weeks  PLANNED INTERVENTIONS: Therapeutic exercises, Therapeutic activity, Neuromuscular re-education, Balance training, Gait training, Patient/Family education, Self Care, Joint mobilization, Joint manipulation,  Aquatic Therapy, Dry Needling, Spinal manipulation, Spinal mobilization, Cryotherapy, Moist heat, Manual therapy, and Re-evaluation.  PLAN FOR NEXT SESSION: Review HEP and progress PRN, dry needling, manual for lumbar and hips, stretching and initiate core strengthening   Hilda Blades, PT, DPT, LAT, ATC 10/09/22  8:27 AM Phone: [phone] Fax: [phone]   [employer] Authorization   Choose one: Rehabilitative  Standardized Assessment or Functional Outcome Tool: FOTO  Score or Percent Disability: 43% disability  Body Parts Treated (Select each separately):  Lumbopelvic. Overall deficits/functional limitations for body part selected: moderate  Check all possible CPT codes: 97164 - PT Re-evaluation, 97110- Therapeutic Exercise, [phone]- Neuro Re-education, [phone] - Gait Training, [phone] - Manual Therapy, [phone] - Therapeutic Activities, [phone] - Self Care, and [phone] - Aquatic therapy    Check all conditions that are expected to impact treatment: Musculoskeletal disorders   If treatment provided at initial evaluation, no treatment charged due to lack of authorization.

## 2022-10-08 ENCOUNTER — Encounter: Payer: Self-pay | Admitting: Physical Therapy

## 2022-10-08 ENCOUNTER — Other Ambulatory Visit: Payer: Self-pay

## 2022-10-08 ENCOUNTER — Ambulatory Visit: Payer: BC Managed Care – PPO | Attending: Surgery | Admitting: Physical Therapy

## 2022-10-08 DIAGNOSIS — G8929 Other chronic pain: Secondary | ICD-10-CM | POA: Insufficient documentation

## 2022-10-08 DIAGNOSIS — M5441 Lumbago with sciatica, right side: Secondary | ICD-10-CM | POA: Insufficient documentation

## 2022-10-08 DIAGNOSIS — M6281 Muscle weakness (generalized): Secondary | ICD-10-CM | POA: Insufficient documentation

## 2022-10-08 DIAGNOSIS — M5442 Lumbago with sciatica, left side: Secondary | ICD-10-CM | POA: Diagnosis not present

## 2022-10-08 DIAGNOSIS — M4726 Other spondylosis with radiculopathy, lumbar region: Secondary | ICD-10-CM | POA: Diagnosis not present

## 2022-10-08 DIAGNOSIS — M5459 Other low back pain: Secondary | ICD-10-CM | POA: Insufficient documentation

## 2022-10-08 NOTE — Patient Instructions (Signed)
Access Code: [email or web address] URL: [email or web address] Date: 10/08/2022 Prepared by: Hilda Blades  Exercises - Supine Piriformis Stretch with Foot on Ground  - 2 x daily - 3 reps - 30 seconds hold - Modified Thomas Stretch  - 2 x daily - 2 reps - 60 seconds hold - Seated Table Hamstring Stretch  - 2 x daily - 3 reps - 30 seconds hold - Bridge  - 1 x daily - 2 sets - 10 reps - Clam with Resistance  - 1 x daily - 2 sets - 10 reps

## 2022-10-12 ENCOUNTER — Ambulatory Visit: Payer: BC Managed Care – PPO | Admitting: Podiatry

## 2022-10-20 ENCOUNTER — Ambulatory Visit: Payer: BC Managed Care – PPO | Admitting: Physical Therapy

## 2022-10-22 ENCOUNTER — Ambulatory Visit (INDEPENDENT_AMBULATORY_CARE_PROVIDER_SITE_OTHER): Payer: BC Managed Care – PPO

## 2022-10-22 ENCOUNTER — Ambulatory Visit (INDEPENDENT_AMBULATORY_CARE_PROVIDER_SITE_OTHER): Payer: BC Managed Care – PPO | Admitting: Podiatry

## 2022-10-22 DIAGNOSIS — M7742 Metatarsalgia, left foot: Secondary | ICD-10-CM | POA: Diagnosis not present

## 2022-10-22 DIAGNOSIS — M62462 Contracture of muscle, left lower leg: Secondary | ICD-10-CM | POA: Diagnosis not present

## 2022-10-22 DIAGNOSIS — Q66221 Congenital metatarsus adductus, right foot: Secondary | ICD-10-CM

## 2022-10-22 DIAGNOSIS — M7751 Other enthesopathy of right foot: Secondary | ICD-10-CM

## 2022-10-22 DIAGNOSIS — M7750 Other enthesopathy of unspecified foot: Secondary | ICD-10-CM

## 2022-10-22 DIAGNOSIS — M7741 Metatarsalgia, right foot: Secondary | ICD-10-CM | POA: Diagnosis not present

## 2022-10-22 DIAGNOSIS — M62461 Contracture of muscle, right lower leg: Secondary | ICD-10-CM

## 2022-10-22 DIAGNOSIS — M7752 Other enthesopathy of left foot: Secondary | ICD-10-CM | POA: Diagnosis not present

## 2022-10-22 DIAGNOSIS — Q6671 Congenital pes cavus, right foot: Secondary | ICD-10-CM

## 2022-10-22 DIAGNOSIS — Q6672 Congenital pes cavus, left foot: Secondary | ICD-10-CM

## 2022-10-22 DIAGNOSIS — Q66222 Congenital metatarsus adductus, left foot: Secondary | ICD-10-CM

## 2022-10-22 NOTE — Patient Instructions (Signed)
Do exercises exactly as told by your health care provider and adjust them as directed. It is normal to feel mild stretching, pulling, tightness, or discomfort as you do these exercises. Stop the exercise right away if you feel sudden pain or your pain gets worse.   Stretching exercises These exercises improve the movement and flexibility of your calf muscles. These exercises may also help to relieve pain and stiffness. Standing gastroc stretch  This exercise is also called a standing calf (gastroc) stretch. Stand with your hands against a wall. Extend your left / right leg behind you, and bend your front knee slightly. Your heels should be on the floor. Keeping your heels on the floor and your back knee straight, shift your weight toward the wall. You should feel a gentle stretch in the back of your lower leg (calf). Hold this position for 10 seconds. Repeat 10 times. Complete this exercise 2 times a day. Gastroc and soleus stretch, standing This is an exercise in which you stand on a step and use your body weight to stretch your calf muscles. To do this exercise: Stand with the ball of your left / right foot on a step. The ball of your foot is on the walking surface, right under your toes. Keep your other foot firmly on the same step. Hold on to the wall, a railing, or a chair for balance. Slowly lift your other foot, allowing your body weight to press your left / right heel down over the edge of the step. You should feel a stretch in your left / right calf. Hold this position for 10 seconds. Return both feet to the step. Repeat this exercise with a slight bend in your left / right knee. Repeat 10 times. Complete this exercise 2 times a day. Strengthening exercise This exercise builds strength and endurance in your foot muscles and may help to take pressure off your heel. Endurance is the ability to use your muscles for a long time, even after they get tired. Arch lifts This exercise is  sometimes called foot intrinsics. This is an exercise in which you lift the arch part of your foot only. To do this exercise: Sit in a chair with your feet flat on the floor. Keeping your big toe and your heel on the floor, lift only your arch, which is on the inner edge of your left / right foot. Do not move your knee or scrunch your toes. This is a small movement. Hold this position for 10 seconds. Return to the starting position. Repeat 10 times. Complete this exercise 2 times a day.

## 2022-10-25 MED ORDER — MELOXICAM 15 MG PO TABS: 15.0000 mg | ORAL_TABLET | Freq: Every day | ORAL | 3 refills | Status: DC

## 2022-10-25 NOTE — Progress Notes (Signed)
  Subjective:  Patient ID: [name], female    DOB: Mar 27, 1981,  MRN: [id number]  Chief Complaint  Patient presents with   Foot Pain    New Patient bilateral foot pain across the ball of her foot   Callouses    Painful callus lesions    41 y.o. female presents with the above complaint. History confirmed with patient.  This been slowly worsening.  She was recently prescribed prednisone taper for sciatica on the right side.  Objective:  Physical Exam: warm, good capillary refill, no trophic changes or ulcerative lesions, normal DP and PT pulses, normal sensory exam, and significant gastrocnemius equinus bilaterally she has pain to palpation across the entire ball of the foot bilaterally   Radiographs of both feet show metatarsal adductus deformity with pes cavus deformity, no fracture dislocation or degenerative changes Assessment:   1. Metatarsalgia of both feet   2. Gastrocnemius equinus of left lower extremity   3. Gastrocnemius equinus of right lower extremity   4. Metatarsus adductus of both feet   5. Pes cavus of both feet      Plan:  Patient was evaluated and treated and all questions answered.  Pes cavus, Equinus, and Metatarsalgia Discussed etiology and treatment options of the above issues and how they are interrelated.  The significant amount of equinus she has a pes cavus deformity leads to increased pressure on the metatarsals especially the lesser metatarsals due to the metatarsal adductus deformity which is congenital.  We discussed surgical and nonsurgical treatment of this.  I discussed with her that surgical treatment of this is likely quite difficult.  She may benefit at some point from a gastrocnemius recession.  Right now would like to pursue nonsurgical treatment I recommended addressing the equinus with stretches at night splint and custom molded orthoses.  These were casted today.  Rx meloxicam sent to pharmacy  Return if symptoms worsen or fail to  improve.

## 2022-10-27 NOTE — Therapy (Incomplete)
OUTPATIENT PHYSICAL THERAPY TREATMENT NOTE   Patient Name: [name] MRN: [id number] DOB:05-Mar-1981, 42 y.o., female Today's Date: 10/27/2022  PCP: [name], NP REFERRING PROVIDER: Lanae Crumbly, PA-C  END OF SESSION:    Past Medical History:  Diagnosis Date   Abnormal Pap smear    Anemia    Chlamydia    Gonorrhea    Headache(784.0)    Herpes simplex type 1 infection    Herpes simplex type 2 infection    HPV (human papilloma virus) infection    Trichomonas    Urinary tract infection    Past Surgical History:  Procedure Laterality Date   CESAREAN SECTION  08/31/2011   Procedure: CESAREAN SECTION;  Surgeon: Agnes Lawrence, MD;  Location: [facility];  Service: Gynecology;  Laterality: N/A;  Primary Cesarean section Baby Boy @ 1418   COLPOSCOPY  2010   LEEP     LIPOMA EXCISION Right 08/08/2021   Procedure: RIGHT FOREARM MASS EXCISION;  Surgeon: Sherilyn Cooter, MD;  Location: [place];  Service: Orthopedics;  Laterality: Right;   Patient Active Problem List   Diagnosis Date Noted   BV (bacterial vaginosis) 06/24/2022   Candida vaginitis 05/06/2022   HSV (herpes simplex virus) infection 03/11/2022   Lipoma of forearm 07/24/2021   Insomnia 06/11/2021   Acute right-sided low back pain with right-sided sciatica 12/04/2020   Anemia 01/29/2020    REFERRING DIAG: Chronic bilateral low back pain with bilateral sciatica; Other spondylosis with radiculopathy, lumbar region   THERAPY DIAG:  No diagnosis found.  Rationale for Evaluation and Treatment Rehabilitation  PERTINENT HISTORY: none  PRECAUTIONS: none   SUBJECTIVE:                                                                                                                                                                                      SUBJECTIVE STATEMENT:  ***   PAIN:  Are you having pain? {[id number]}   OBJECTIVE: (objective measures completed at initial  evaluation unless otherwise dated)  DIAGNOSTIC FINDINGS:  X-ray 09/29/2022   PATIENT SURVEYS:  FOTO 57% functional status   SCREENING FOR RED FLAGS: Negative   COGNITION: Overall cognitive status: Within functional limits for tasks assessed                          SENSATION: WFL   MUSCLE LENGTH: Bilateral hamstring, hip flexor/quad, and piriformis tightness   POSTURE:  Increased lumbar lordosis   PALPATION: Tender to palpation bilateral lumbar paraspinals and gluteals   LUMBAR ROM:    AROM eval  Flexion 75%  Extension WFL  Right lateral  flexion WFL  Left lateral flexion WFL  Right rotation WFL  Left rotation [facility]              Patient reports pain reduction with extension   LOWER EXTREMITY ROM:                          LE ROM grossly WFL   LOWER EXTREMITY MMT:     MMT Right eval Left eval  Hip flexion 4 4  Hip extension 3+ 3+  Hip abduction 4- 4  Hip adduction      Hip internal rotation      Hip external rotation      Knee flexion 5 5  Knee extension 5 5  Ankle dorsiflexion 5 5  Ankle plantarflexion      Ankle inversion      Ankle eversion 5 5   (Blank rows = not tested)   LUMBAR SPECIAL TESTS:  Straight leg raise test: Negative   FUNCTIONAL TESTS:  DLLT: pain onset at approximately 60 deg without significant difficulty maintaining neutral lumbar spine   GAIT: Assistive device utilized: None Level of assistance: Complete Independence Comments: Slightly antalgic on right     TODAY'S TREATMENT:     [facility] Adult PT Treatment:                                                DATE: 10/07/2022 Therapeutic Exercise: Piriformis stretch 2 x 30 sec each Modified thomas stretch x 60 sec each  Longsitting hamstring stretch 2 x 30 sec Bridge x 10 Clamshell with red x 10   PATIENT EDUCATION:  Education details: Exam findings, POC, HEP Person educated: Patient Education method: Explanation, Demonstration, Tactile cues, Verbal cues, and  Handouts Education comprehension: verbalized understanding, returned demonstration, verbal cues required, tactile cues required, and needs further education   HOME EXERCISE PROGRAM: Access Code: [email or web address]      ASSESSMENT: CLINICAL IMPRESSION: Patient is a 42 y.o. female who was seen today for physical therapy evaluation and treatment for chronic low back and right sided leg pain. Her right leg symptoms do seem to be radicular with extension preference. She also exhibits gross core and hip strength deficits with increased low back pain while testing likely being a main contributor to her symptoms with maintaining lumbar postural control. She also demonstrates gross flexibility deficits and was provided exercises to address her limitations.      OBJECTIVE IMPAIRMENTS: decreased activity tolerance, decreased ROM, decreased strength, impaired flexibility, improper body mechanics, postural dysfunction, and pain.    ACTIVITY LIMITATIONS: carrying, lifting, bending, sitting, standing, squatting, stairs, and locomotion level   PARTICIPATION LIMITATIONS: meal prep, cleaning, community activity, and occupation   PERSONAL FACTORS: Fitness, Past/current experiences, and Time since onset of injury/illness/exacerbation are also affecting patient's functional outcome.    REHAB POTENTIAL: Good   CLINICAL DECISION MAKING: Stable/uncomplicated   EVALUATION COMPLEXITY: Low     GOALS: Goals reviewed with patient? Yes   SHORT TERM GOALS: Target date: 11/05/2022   Patient will be I with initial HEP in order to progress with therapy. Baseline: HEP provided at eval Goal status: INITIAL   2.  PT will review FOTO with patient by 3rd visit in order to understand expected progress and outcome with therapy. Baseline: FOTO assessed at eval Goal status: INITIAL   3.  Patient will report low back pain level </ 2/10 in order to reduce her functional limitations at work Baseline: 4/10 Goal status: INITIAL    LONG TERM GOALS: Target date: 12/03/2022   Patient will be I with final HEP to maintain progress from PT. Baseline: HEP provided at eval Goal status: INITIAL   2.  Patient will report >/= 70% status on FOTO to indicate improved functional ability. Baseline: 57% functional status Goal status: INITIAL   3.  Patient will demonstrate hip and core strength grossly >/= 4/5 MMT in order to improve standing tolerance at work Baseline: gross strength deficit of hip and core Goal status: INITIAL   4.  Patient will report no limitations with sitting or standing in order to reduce functional limitations Baseline: patient reports increased pain and limitation with sitting or standings Goal status: INITIAL     PLAN: PT FREQUENCY: 1x/week   PT DURATION: 8 weeks   PLANNED INTERVENTIONS: Therapeutic exercises, Therapeutic activity, Neuromuscular re-education, Balance training, Gait training, Patient/Family education, Self Care, Joint mobilization, Joint manipulation, Aquatic Therapy, Dry Needling, Spinal manipulation, Spinal mobilization, Cryotherapy, Moist heat, Manual therapy, and Re-evaluation.   PLAN FOR NEXT SESSION: Review HEP and progress PRN, dry needling, manual for lumbar and hips, stretching and initiate core strengthening  Gwendolyn Grant, PT, DPT, ATC 10/27/22 1:37 PM

## 2022-10-28 ENCOUNTER — Ambulatory Visit: Payer: BC Managed Care – PPO

## 2022-11-02 ENCOUNTER — Ambulatory Visit: Payer: BC Managed Care – PPO | Admitting: Physical Therapy

## 2022-11-02 NOTE — Therapy (Incomplete)
OUTPATIENT PHYSICAL THERAPY TREATMENT NOTE   Patient Name: [name] MRN: [id number] DOB:20-Jun-1981, 42 y.o., female Today's Date: 11/02/2022  PCP: [name], NP   REFERRING PROVIDER: Lanae Crumbly, PA-C   END OF SESSION:    Past Medical History:  Diagnosis Date   Abnormal Pap smear    Anemia    Chlamydia    Gonorrhea    Headache(784.0)    Herpes simplex type 1 infection    Herpes simplex type 2 infection    HPV (human papilloma virus) infection    Trichomonas    Urinary tract infection    Past Surgical History:  Procedure Laterality Date   CESAREAN SECTION  08/31/2011   Procedure: CESAREAN SECTION;  Surgeon: Agnes Lawrence, MD;  Location: [facility];  Service: Gynecology;  Laterality: N/A;  Primary Cesarean section Baby Boy @ 1418   COLPOSCOPY  2010   LEEP     LIPOMA EXCISION Right 08/08/2021   Procedure: RIGHT FOREARM MASS EXCISION;  Surgeon: Sherilyn Cooter, MD;  Location: [place];  Service: Orthopedics;  Laterality: Right;   Patient Active Problem List   Diagnosis Date Noted   BV (bacterial vaginosis) 06/24/2022   Candida vaginitis 05/06/2022   HSV (herpes simplex virus) infection 03/11/2022   Lipoma of forearm 07/24/2021   Insomnia 06/11/2021   Acute right-sided low back pain with right-sided sciatica 12/04/2020   Anemia 01/29/2020    REFERRING DIAG: Chronic bilateral low back pain with bilateral sciatica; Other spondylosis with radiculopathy, lumbar region   THERAPY DIAG:  No diagnosis found.  Rationale for Evaluation and Treatment Rehabilitation  PERTINENT HISTORY: None   PRECAUTIONS: None    SUBJECTIVE:      SUBJECTIVE STATEMENT:  ***  PAIN:  Are you having pain? Yes:  NPRS scale: 4/10 Pain location: Lower back, shooting pain right leg Pain description: Cramp, shooting Aggravating factors: Sitting or standing extended periods, lying on back, lifting, stairs Relieving factors: Medication, heat, leaning  forward   OBJECTIVE: (objective measures completed at initial evaluation unless otherwise dated) PATIENT SURVEYS:  FOTO 57% functional status   SCREENING FOR RED FLAGS: Negative   COGNITION: Overall cognitive status: Within functional limits for tasks assessed                          SENSATION: WFL   MUSCLE LENGTH: Bilateral hamstring, hip flexor/quad, and piriformis tightness   POSTURE:  Increased lumbar lordosis   PALPATION: Tender to palpation bilateral lumbar paraspinals and gluteals   LUMBAR ROM:    AROM eval  Flexion 75%  Extension WFL  Right lateral flexion WFL  Left lateral flexion WFL  Right rotation WFL  Left rotation [facility]              Patient reports pain reduction with extension   LOWER EXTREMITY ROM:                          LE ROM grossly WFL   LOWER EXTREMITY MMT:     MMT Right eval Left eval  Hip flexion 4 4  Hip extension 3+ 3+  Hip abduction 4- 4  Hip adduction      Hip internal rotation      Hip external rotation      Knee flexion 5 5  Knee extension 5 5  Ankle dorsiflexion 5 5  Ankle plantarflexion      Ankle inversion  Ankle eversion 5 5   (Blank rows = not tested)   LUMBAR SPECIAL TESTS:  Straight leg raise test: Negative   FUNCTIONAL TESTS:  DLLT: pain onset at approximately 60 deg without significant difficulty maintaining neutral lumbar spine   GAIT: Assistive device utilized: None Level of assistance: Complete Independence Comments: Slightly antalgic on right     TODAY'S TREATMENT:     [facility] Adult PT Treatment:                                                DATE: 11/02/2022 Therapeutic Exercise: ***   [facility] Adult PT Treatment:                                                DATE: 10/07/2022 Therapeutic Exercise: Piriformis stretch 2 x 30 sec each Modified thomas stretch x 60 sec each  Longsitting hamstring stretch 2 x 30 sec Bridge x 10 Clamshell with red x 10   PATIENT EDUCATION:  Education details:  HEP Person educated: Patient Education method: Consulting civil engineer, Media planner, Corporate treasurer cues, Verbal cues, and Handouts Education comprehension: verbalized understanding, returned demonstration, verbal cues required, tactile cues required, and needs further education   HOME EXERCISE PROGRAM: Access Code: [email or web address]      ASSESSMENT: CLINICAL IMPRESSION: Patient tolerated therapy well with no adverse effects. ***   Patient is a 42 y.o. female who was seen today for physical therapy evaluation and treatment for chronic low back and right sided leg pain. Her right leg symptoms do seem to be radicular with extension preference. She also exhibits gross core and hip strength deficits with increased low back pain while testing likely being a main contributor to her symptoms with maintaining lumbar postural control. She also demonstrates gross flexibility deficits and was provided exercises to address her limitations.      OBJECTIVE IMPAIRMENTS: decreased activity tolerance, decreased ROM, decreased strength, impaired flexibility, improper body mechanics, postural dysfunction, and pain.    ACTIVITY LIMITATIONS: carrying, lifting, bending, sitting, standing, squatting, stairs, and locomotion level   PARTICIPATION LIMITATIONS: meal prep, cleaning, community activity, and occupation   PERSONAL FACTORS: Fitness, Past/current experiences, and Time since onset of injury/illness/exacerbation are also affecting patient's functional outcome.      GOALS: Goals reviewed with patient? Yes   SHORT TERM GOALS: Target date: 11/05/2022   Patient will be I with initial HEP in order to progress with therapy. Baseline: HEP provided at eval Goal status: INITIAL   2.  PT will review FOTO with patient by 3rd visit in order to understand expected progress and outcome with therapy. Baseline: FOTO assessed at eval Goal status: INITIAL   3.  Patient will report low back pain level </ 2/10 in order to reduce her  functional limitations at work Baseline: 4/10 Goal status: INITIAL   LONG TERM GOALS: Target date: 12/03/2022   Patient will be I with final HEP to maintain progress from PT. Baseline: HEP provided at eval Goal status: INITIAL   2.  Patient will report >/= 70% status on FOTO to indicate improved functional ability. Baseline: 57% functional status Goal status: INITIAL   3.  Patient will demonstrate hip and core strength grossly >/= 4/5 MMT in order to improve standing tolerance  at work Baseline: gross strength deficit of hip and core Goal status: INITIAL   4.  Patient will report no limitations with sitting or standing in order to reduce functional limitations Baseline: patient reports increased pain and limitation with sitting or standings Goal status: INITIAL     PLAN: PT FREQUENCY: 1x/week   PT DURATION: 8 weeks   PLANNED INTERVENTIONS: Therapeutic exercises, Therapeutic activity, Neuromuscular re-education, Balance training, Gait training, Patient/Family education, Self Care, Joint mobilization, Joint manipulation, Aquatic Therapy, Dry Needling, Spinal manipulation, Spinal mobilization, Cryotherapy, Moist heat, Manual therapy, and Re-evaluation.   PLAN FOR NEXT SESSION: Review HEP and progress PRN, dry needling, manual for lumbar and hips, stretching and initiate core strengthening   Hilda Blades, PT, DPT, LAT, ATC 11/02/22  8:18 AM Phone: [phone] Fax: [phone]

## 2022-11-06 ENCOUNTER — Ambulatory Visit (INDEPENDENT_AMBULATORY_CARE_PROVIDER_SITE_OTHER): Payer: BC Managed Care – PPO | Admitting: Nurse Practitioner

## 2022-11-06 ENCOUNTER — Encounter: Payer: Self-pay | Admitting: Nurse Practitioner

## 2022-11-06 VITALS — BP 120/70 | HR 76 | Ht 68.0 in | Wt 184.0 lb

## 2022-11-06 DIAGNOSIS — R1084 Generalized abdominal pain: Secondary | ICD-10-CM

## 2022-11-06 DIAGNOSIS — R140 Abdominal distension (gaseous): Secondary | ICD-10-CM

## 2022-11-06 DIAGNOSIS — K5909 Other constipation: Secondary | ICD-10-CM | POA: Diagnosis not present

## 2022-11-06 MED ORDER — LUBIPROSTONE 8 MCG PO CAPS: 8.0000 ug | ORAL_CAPSULE | Freq: Two times a day (BID) | ORAL | 3 refills | Status: DC

## 2022-11-06 NOTE — Progress Notes (Signed)
Assessment    Patient profile:  [name] is a 42 y.o. year old female , new to the practice with a past medical history of chronic low back pain, chronic constipation .  See PMH / [place] for additional history.  Referred by PCP for bloating and constipation  # Chronic constipation, mainly characterized by straining.   # Intermittent bloating / generalized abdominal pain. Symptoms could be secondary to constipation.    Plan:    Continue stool softeners - recommend 2 at bedtime 60 oz water daily Will refrain from adding fiber due to bloating.  Trial of Amitiza 8 mcg BID # 60 with 3 refills Elevate feet 8 inches during BM Further evaluation of abdominal pain / bloating if symptoms persist after resolution of constipation.  Follow up in 2 months. If no significant improvement will consider referral to pelvic floor PT . Though pelvic floor dysfunction not suspected based on DRE today but further evaluation / treatment by PT may be helpful.    HPI:    Chief Complaint: Abdominal pain,  constipation, bloating  [name] gives a history of chronic constipation.  She has the urge to have a bowel movement 2-3 times a week but has a difficult time expelling the stool.  Her stools are hard.  She previously has tried stool softeners which helped but did not alleviate the problem.  She tried twice daily MiraLAX for a couple months but that did not help.  She has taken over-the-counter laxatives but they take too many days to work and then result in diarrhea.  Enemas are helpful.  She drinks about 5 glasses of water a day.  She does not take any over-the-counter vitamins or supplements.  She has not seen any blood in her stool  In addition to constipation she complains of frequent bloating with associated generalized abdominal pain.  Bloating does not occur every day.  Having a bowel movement usually helps with the pain but not so much with the bloating.    There is no known family history of  IBD or colon cancer  Previous Labs / Imaging::    Latest Ref Rng & Units 02/11/2022   12:38 PM 02/11/2022   12:10 PM 06/11/2021    3:55 PM  CBC  WBC 4.0 - 10.5 K/uL  7.9  7.1   Hemoglobin 12.0 - 15.0 g/dL 13.6  12.7  12.9   Hematocrit 36.0 - 46.0 % 40.0  39.3  38.9   Platelets 150 - 400 K/uL  226  225     Lab Results  Component Value Date   LIPASE 22 01/20/2009      Latest Ref Rng & Units 02/11/2022   12:38 PM 02/11/2022   12:10 PM 06/11/2021    3:55 PM  CMP  Glucose 70 - 99 mg/dL 95  97  85   BUN 6 - 20 mg/dL '9  8  8   '$ Creatinine 0.44 - 1.00 mg/dL 0.70  0.78  0.71   Sodium 135 - 145 mmol/L 137  137  139   Potassium 3.5 - 5.1 mmol/L 3.8  4.0  3.7   Chloride 98 - 111 mmol/L 103  105  106   CO2 22 - 32 mmol/L  27  22   Calcium 8.9 - 10.3 mg/dL  9.0  8.8   Total Protein 6.5 - 8.1 g/dL  7.1  6.6   Total Bilirubin 0.3 - 1.2 mg/dL  0.7  0.2   Alkaline Phos 38 -  126 U/L  55  61   AST 15 - 41 U/L  14  12   ALT 0 - 44 U/L  9  8    No abdominal imaging   Past Medical History:  Diagnosis Date   Abnormal Pap smear    Anemia    Chlamydia    Gonorrhea    Headache(784.0)    Herpes simplex type 1 infection    Herpes simplex type 2 infection    HPV (human papilloma virus) infection    Trichomonas    Urinary tract infection    Past Surgical History:  Procedure Laterality Date   CESAREAN SECTION  08/31/2011   Procedure: CESAREAN SECTION;  Surgeon: Agnes Lawrence, MD;  Location: [facility];  Service: Gynecology;  Laterality: N/A;  Primary Cesarean section Baby Boy @ 1418   COLPOSCOPY  2010   LEEP     LIPOMA EXCISION Right 08/08/2021   Procedure: RIGHT FOREARM MASS EXCISION;  Surgeon: Sherilyn Cooter, MD;  Location: [place];  Service: Orthopedics;  Laterality: Right;   Family History  Problem Relation Age of Onset   Hypertension Mother    Diabetes Mother    Stroke Mother    Heart disease Brother    Heart attack Brother 70   Social History   Tobacco  Use   Smoking status: Former    Packs/day: 0.25    Years: 5.00    Total pack years: 1.25    Types: Cigarettes    Passive exposure: Past   Smokeless tobacco: Never   Tobacco comments:    quit with + preg  Vaping Use   Vaping Use: Never used  Substance Use Topics   Alcohol use: Yes    Alcohol/week: 1.0 standard drink of alcohol    Types: 1 Standard drinks or equivalent per week    Comment: rare   Drug use: No   Current Outpatient Medications  Medication Sig Dispense Refill   meloxicam (MOBIC) 15 MG tablet Take 1 tablet (15 mg total) by mouth daily. 30 tablet 3   methylPREDNISolone (MEDROL) 4 MG tablet 6 day taper be taken as directed (Patient not taking: Reported on 10/08/2022) 21 tablet 0   metroNIDAZOLE (FLAGYL) 500 MG tablet Take 500 mg by mouth 3 (three) times daily.     metroNIDAZOLE (FLAGYL) 500 MG tablet Take 1 tablet (500 mg total) by mouth 2 (two) times daily. 14 tablet 0   traZODone (DESYREL) 50 MG tablet Take 1 tablet (50 mg total) by mouth at bedtime. 30 tablet 0   No current facility-administered medications for this visit.   No Known Allergies   Review of Systems: Positive for anxiety, blood in urine, fatigue, menstrual pain, headaches, sleeping problems, muscle pain and cramps.  All other systems reviewed and negative except where noted in HPI.   Wt Readings from Last 3 Encounters:  09/24/22 184 lb (83.5 kg)  09/11/22 183 lb (83 kg)  06/23/22 178 lb (80.7 kg)    Physical Exam   Pulse 76   Ht '5\' 8"'$  (1.727 m)   Wt 184 lb (83.5 kg)   BMI 27.98 kg/m  Constitutional:  Generally well appearing female in no acute distress. Psychiatric: Pleasant. Normal mood and affect. Behavior is normal. EENT: Pupils normal.  Conjunctivae are normal. No scleral icterus. Neck supple.  Cardiovascular: Normal rate, regular rhythm.  Pulmonary/chest: Effort normal and breath sounds normal. No wheezing, rales or rhonchi. Abdominal: Soft, nondistended, nontender. Bowel sounds  active throughout. There are  no masses palpable. No hepatomegaly. Rectal: Good sphincter resting pressure. Good squeeze pressure. With Valsalva there was good contraction of abdominal muscles, relaxation of sphincter and good pelvic floor descent. No dyssynergic defecation suspected.  Neurological: Alert and oriented to person place and time. Extremities: No edema Skin: Skin is warm and dry. No rashes noted.  Tye Savoy, NP  11/06/2022, 1:32 PM  Cc:  Referring Provider [name], NP

## 2022-11-06 NOTE — Patient Instructions (Addendum)
We have sent the following medications to your pharmacy for you to pick up at your convenience: Amitiza   Try to drink 60 oz of water daily.   Please see low fodmap diet handout.   Follow up on 01/06/2023 at 2:00 pm.   Due to recent changes in healthcare laws, you may see the results of your imaging and laboratory studies on MyChart before your provider has had a chance to review them.  We understand that in some cases there may be results that are confusing or concerning to you. Not all laboratory results come back in the same time frame and the provider may be waiting for multiple results in order to interpret others.  Please give [place] 48 hours in order for your provider to thoroughly review all the results before contacting the office for clarification of your results.   Thank you for choosing me and [facility].  Tye Savoy -NP

## 2022-11-08 ENCOUNTER — Emergency Department (HOSPITAL_COMMUNITY)
Admission: EM | Admit: 2022-11-08 | Discharge: 2022-11-08 | Disposition: A | Discharge status: Home / Self Care | Payer: BC Managed Care – PPO | Attending: Emergency Medicine | Admitting: Emergency Medicine

## 2022-11-08 ENCOUNTER — Encounter (HOSPITAL_COMMUNITY): Payer: Self-pay

## 2022-11-08 ENCOUNTER — Emergency Department (HOSPITAL_COMMUNITY): Payer: BC Managed Care – PPO

## 2022-11-08 ENCOUNTER — Other Ambulatory Visit: Payer: Self-pay

## 2022-11-08 DIAGNOSIS — M25512 Pain in left shoulder: Secondary | ICD-10-CM

## 2022-11-08 DIAGNOSIS — M62838 Other muscle spasm: Secondary | ICD-10-CM

## 2022-11-08 MED ORDER — LIDOCAINE 5 % EX PTCH: 1.0000 | MEDICATED_PATCH | CUTANEOUS | 0 refills | Status: DC

## 2022-11-08 MED ORDER — METHOCARBAMOL 500 MG PO TABS: 500.0000 mg | ORAL_TABLET | Freq: Two times a day (BID) | ORAL | 0 refills | Status: DC

## 2022-11-08 MED ORDER — NAPROXEN 500 MG PO TABS: 500.0000 mg | ORAL_TABLET | Freq: Two times a day (BID) | ORAL | 0 refills | Status: DC

## 2022-11-08 MED ORDER — KETOROLAC TROMETHAMINE 60 MG/2ML IM SOLN
60.0000 mg | Freq: Once | INTRAMUSCULAR | Status: AC
  Administered 2022-11-08: 60 mg via INTRAMUSCULAR
  Filled 2022-11-08: qty 2

## 2022-11-08 MED ORDER — HYDROCODONE-ACETAMINOPHEN 5-325 MG PO TABS
1.0000 | ORAL_TABLET | Freq: Once | ORAL | Status: AC
  Administered 2022-11-08: 1 via ORAL
  Filled 2022-11-08: qty 1

## 2022-11-08 NOTE — ED Provider Notes (Signed)
[facility] EMERGENCY DEPARTMENT Provider Note   CSN: [id number] Arrival date & time: 11/08/22  1239     History  Chief Complaint  Patient presents with   Shoulder Pain    [name] is a 42 y.o. female.  With PMH of anemia and further information is listed below who presents with left shoulder pain.She says she has pain in her left posterior shoulder worse with movement of her shoulder and pressing on that region. She denies any recent traumatic injuries however does note lifting heavy things frequently at work. She denies any associated fevers, skin changes, rash, nausea, vomiting, CP, SOB. She was trying shoulder exercises and Tylenol without relief. There has been no numbness, tingling, weakness or loss ofsensation of her left shoulder and arm. No h/o PE or DVT.    Shoulder Pain      Home Medications Prior to Admission medications   Medication Sig Start Date End Date Taking? Authorizing Provider  lidocaine (LIDODERM) 5 % Place 1 patch onto the skin daily. Remove & Discard patch within 12 hours or as directed by MD 11/08/22  Yes Elgie Congo, MD  methocarbamol (ROBAXIN) 500 MG tablet Take 1 tablet (500 mg total) by mouth 2 (two) times daily. 11/08/22  Yes Elgie Congo, MD  naproxen (NAPROSYN) 500 MG tablet Take 1 tablet (500 mg total) by mouth 2 (two) times daily. 11/08/22  Yes Elgie Congo, MD  lubiprostone (AMITIZA) 8 MCG capsule Take 1 capsule (8 mcg total) by mouth 2 (two) times daily with a meal. 11/06/22   Willia Craze, NP  meloxicam (MOBIC) 15 MG tablet Take 1 tablet (15 mg total) by mouth daily. 10/25/22   McDonald, Stephan Minister, DPM  traZODone (DESYREL) 50 MG tablet Take 1 tablet (50 mg total) by mouth at bedtime. 03/10/22 11/06/22  [name], NP      Allergies    Patient has no known allergies.    Review of Systems   Review of Systems  Physical Exam Updated Vital Signs BP 118/75 (BP Location: Right Arm)   Pulse 97    Temp 98.9 F (37.2 C) (Oral)   Ht '5\' 8"'$  (1.727 m)   Wt 83.5 kg   SpO2 100%   BMI 27.99 kg/m  Physical Exam Constitutional: Alert and oriented. Well appearing and in no distress. Eyes: Conjunctivae are normal. ENT      Head: Normocephalic and atraumatic.      Neck: No stridor. Cardiovascular: S1, S2, regular rate and rhythm equal palpable radial pulses.Warm and well perfused. Respiratory: Normal respiratory effort. Breath sounds are normal.  O2 sat 100 on RA Gastrointestinal: Soft and nondistended Musculoskeletal: Normal range of motion in all extremities.  Tenderness to palpation along the left scapular ridge in the location of the trapezius muscle.  There is no external warmth, erythema, skin changes.  Full range of motion of left shoulder intact both actively and passively with no severe pain on passive range of motion.  No tenderness or deformity distal to left shoulder.  Full grip strength and sensation intact distally.      Right lower leg: No tenderness or edema.      Left lower leg: No tenderness or edema. Neurologic: Normal speech and language. No gross focal neurologic deficits are appreciated. Skin: Skin is warm, dry and intact. No rash noted. Psychiatric: Mood and affect are normal. Speech and behavior are normal.  ED Results / Procedures / Treatments   Labs (all labs  ordered are listed, but only abnormal results are displayed) Labs Reviewed - No data to display  EKG None  Radiology DG Shoulder Left  Result Date: 11/08/2022 CLINICAL DATA:  Pain.  Atraumatic left shoulder EXAM: LEFT SHOULDER - 2+ VIEW COMPARISON:  None Available. FINDINGS: There is no evidence of fracture or dislocation. There is no evidence of arthropathy or other focal bone abnormality. Soft tissues are unremarkable. IMPRESSION: Negative. Electronically Signed   By: Audie Pinto M.D.   On: 11/08/2022 14:09    Procedures Procedures   Medications Ordered in ED Medications   HYDROcodone-acetaminophen (NORCO/VICODIN) 5-325 MG per tablet 1 tablet (1 tablet Oral Given 11/08/22 1312)  ketorolac (TORADOL) injection 60 mg (60 mg Intramuscular Given 11/08/22 1534)    ED Course/ Medical Decision Making/ A&P   {                            Medical Decision Making [name] is a 42 y.o. female.  With PMH of anemia and further information is listed below who presents with left shoulder pain.  Based on patient's history and presentation, suspect likely nonspecific MSK pain of the left trapezius region likely muscle strain or possible spasm.  She had a left shoulder x-ray which I personally reviewed no evidence of fracture or dislocation.  There is no warmth, fever, infectious symptoms or skin changes concerning for cellulitis.  She is neurovascularly intact, no concern for ischemic limb.  She has no respiratory symptoms or cardiopulmonary symptoms no hypoxia, PERC negative, no concern for PE.  Provided analgesia in the ED and discussed supportive care.  Discharged with naproxen, Robaxin and Lidoderm patches.  Advise close follow-up with PCP and strict return precautions.  Patient in agreement with plan and safe for discharge at this time.  Risk Prescription drug management.   Final Clinical Impression(s) / ED Diagnoses Final diagnoses:  Acute pain of left shoulder  Muscle spasm    Rx / DC Orders ED Discharge Orders          Ordered    naproxen (NAPROSYN) 500 MG tablet  2 times daily        11/08/22 1519    methocarbamol (ROBAXIN) 500 MG tablet  2 times daily        11/08/22 1519    lidocaine (LIDODERM) 5 %  Every 24 hours        11/08/22 1519              Elgie Congo, MD 11/08/22 1539

## 2022-11-08 NOTE — ED Provider Triage Note (Signed)
Emergency Medicine Provider Triage Evaluation Note  [name] , a 41 y.o. female  was evaluated in triage.  Pt complains of left shoulder pain which began yesterday while she was lying on the bed.  Reports significant pain with ranging of her neck.  Also with lifting of her left arm.  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  Did apply a Salonpas patch to the back which has not helped with symptoms.  Chest pain, no shortness of breath   Review of Systems  Positive: Left back pain Negative: Fever, chest pain, sob  Physical Exam  BP 118/75 (BP Location: Right Arm)   Pulse 97   Temp 98.9 F (37.2 C) (Oral)   SpO2 100%  Gen:   Awake, no distress   Resp:  Normal effort  MSK:   Moves extremities without difficulty  Other:  Ttp along the left back and around her scapula, decrease ROM of her left arm.   Medical Decision Making  Medically screening exam initiated at 1:01 PM.  Appropriate orders placed.  [name] was informed that the remainder of the evaluation will be completed by another provider, this initial triage assessment does not replace that evaluation, and the importance of remaining in the ED until their evaluation is complete.     Wendy Fitting, PA-C 11/08/22 1301

## 2022-11-08 NOTE — Discharge Instructions (Addendum)
Your x-ray was negative for fracture or dislocation.  We are suspecting you are having pain from a muscle spasm.  Continue heating and icing that region and using the lidocaine patches, Robaxin as needed for muscle spasms, naproxen and Tylenol for pain control.  Follow-up with your primary care doctor regarding your visit to the ER today.  Come back for any severe worsening pain, high fevers, loss of sensation or weakness of the arm, chest pain, shortness of breath or any other symptoms concerning to you.

## 2022-11-08 NOTE — ED Triage Notes (Signed)
Pt c/o left shoulder pain that started last night. Pt states she can't turn to the left and has decreased ROM of left shoulder due to pain. Pt has 2+ left radial pulse, cap refill less than 3 sec,5/5 left grip strength. Pt denies injury to the shoulder.

## 2022-11-09 ENCOUNTER — Ambulatory Visit: Payer: BC Managed Care – PPO | Admitting: Physical Therapy

## 2022-11-09 NOTE — Therapy (Incomplete)
OUTPATIENT PHYSICAL THERAPY TREATMENT NOTE   Patient Name: [name] MRN: [id number] DOB:September 30, 1981, 42 y.o., female Today's Date: 11/09/2022  PCP: [name], NP   REFERRING PROVIDER: Lanae Crumbly, PA-C   END OF SESSION:    Past Medical History:  Diagnosis Date   Abnormal Pap smear    Anemia    Chlamydia    Gonorrhea    Headache(784.0)    Herpes simplex type 1 infection    Herpes simplex type 2 infection    HPV (human papilloma virus) infection    Trichomonas    Urinary tract infection    Past Surgical History:  Procedure Laterality Date   CESAREAN SECTION  08/31/2011   Procedure: CESAREAN SECTION;  Surgeon: Agnes Lawrence, MD;  Location: [facility];  Service: Gynecology;  Laterality: N/A;  Primary Cesarean section Baby Boy @ 1418   COLPOSCOPY  2010   LEEP     LIPOMA EXCISION Right 08/08/2021   Procedure: RIGHT FOREARM MASS EXCISION;  Surgeon: Sherilyn Cooter, MD;  Location: [facility];  Service: Orthopedics;  Laterality: Right;   Patient Active Problem List   Diagnosis Date Noted   BV (bacterial vaginosis) 06/24/2022   Candida vaginitis 05/06/2022   HSV (herpes simplex virus) infection 03/11/2022   Lipoma of forearm 07/24/2021   Insomnia 06/11/2021   Acute right-sided low back pain with right-sided sciatica 12/04/2020   Anemia 01/29/2020    REFERRING DIAG: Chronic bilateral low back pain with bilateral sciatica; Other spondylosis with radiculopathy, lumbar region   THERAPY DIAG:  No diagnosis found.  Rationale for Evaluation and Treatment Rehabilitation  PERTINENT HISTORY: None   PRECAUTIONS: None    SUBJECTIVE:      SUBJECTIVE STATEMENT:  ***  PAIN:  Are you having pain? Yes:  NPRS scale: 4/10 Pain location: Lower back, shooting pain right leg Pain description: Cramp, shooting Aggravating factors: Sitting or standing extended periods, lying on back, lifting, stairs Relieving factors: Medication, heat, leaning  forward   OBJECTIVE: (objective measures completed at initial evaluation unless otherwise dated) PATIENT SURVEYS:  FOTO 57% functional status   SCREENING FOR RED FLAGS: Negative   COGNITION: Overall cognitive status: Within functional limits for tasks assessed                          SENSATION: WFL   MUSCLE LENGTH: Bilateral hamstring, hip flexor/quad, and piriformis tightness   POSTURE:  Increased lumbar lordosis   PALPATION: Tender to palpation bilateral lumbar paraspinals and gluteals   LUMBAR ROM:    AROM eval  Flexion 75%  Extension WFL  Right lateral flexion WFL  Left lateral flexion WFL  Right rotation WFL  Left rotation [facility]              Patient reports pain reduction with extension   LOWER EXTREMITY ROM:                          LE ROM grossly WFL   LOWER EXTREMITY MMT:     MMT Right eval Left eval  Hip flexion 4 4  Hip extension 3+ 3+  Hip abduction 4- 4  Hip adduction      Hip internal rotation      Hip external rotation      Knee flexion 5 5  Knee extension 5 5  Ankle dorsiflexion 5 5  Ankle plantarflexion      Ankle inversion  Ankle eversion 5 5   (Blank rows = not tested)   LUMBAR SPECIAL TESTS:  Straight leg raise test: Negative   FUNCTIONAL TESTS:  DLLT: pain onset at approximately 60 deg without significant difficulty maintaining neutral lumbar spine   GAIT: Assistive device utilized: None Level of assistance: Complete Independence Comments: Slightly antalgic on right     TODAY'S TREATMENT:     [facility] Adult PT Treatment:                                                DATE: 11/09/2022 Therapeutic Exercise: ***   [facility] Adult PT Treatment:                                                DATE: 10/07/2022 Therapeutic Exercise: Piriformis stretch 2 x 30 sec each Modified thomas stretch x 60 sec each  Longsitting hamstring stretch 2 x 30 sec Bridge x 10 Clamshell with red x 10   PATIENT EDUCATION:  Education details:  HEP Person educated: Patient Education method: Consulting civil engineer, Media planner, Corporate treasurer cues, Verbal cues, and Handouts Education comprehension: verbalized understanding, returned demonstration, verbal cues required, tactile cues required, and needs further education   HOME EXERCISE PROGRAM: Access Code: [email or web address]      ASSESSMENT: CLINICAL IMPRESSION: Patient tolerated therapy well with no adverse effects. ***   Patient is a 42 y.o. female who was seen today for physical therapy evaluation and treatment for chronic low back and right sided leg pain. Her right leg symptoms do seem to be radicular with extension preference. She also exhibits gross core and hip strength deficits with increased low back pain while testing likely being a main contributor to her symptoms with maintaining lumbar postural control. She also demonstrates gross flexibility deficits and was provided exercises to address her limitations.      OBJECTIVE IMPAIRMENTS: decreased activity tolerance, decreased ROM, decreased strength, impaired flexibility, improper body mechanics, postural dysfunction, and pain.    ACTIVITY LIMITATIONS: carrying, lifting, bending, sitting, standing, squatting, stairs, and locomotion level   PARTICIPATION LIMITATIONS: meal prep, cleaning, community activity, and occupation   PERSONAL FACTORS: Fitness, Past/current experiences, and Time since onset of injury/illness/exacerbation are also affecting patient's functional outcome.      GOALS: Goals reviewed with patient? Yes   SHORT TERM GOALS: Target date: 11/05/2022   Patient will be I with initial HEP in order to progress with therapy. Baseline: HEP provided at eval Goal status: INITIAL   2.  PT will review FOTO with patient by 3rd visit in order to understand expected progress and outcome with therapy. Baseline: FOTO assessed at eval Goal status: INITIAL   3.  Patient will report low back pain level </ 2/10 in order to reduce her  functional limitations at work Baseline: 4/10 Goal status: INITIAL   LONG TERM GOALS: Target date: 12/03/2022   Patient will be I with final HEP to maintain progress from PT. Baseline: HEP provided at eval Goal status: INITIAL   2.  Patient will report >/= 70% status on FOTO to indicate improved functional ability. Baseline: 57% functional status Goal status: INITIAL   3.  Patient will demonstrate hip and core strength grossly >/= 4/5 MMT in order to improve standing tolerance  at work Baseline: gross strength deficit of hip and core Goal status: INITIAL   4.  Patient will report no limitations with sitting or standing in order to reduce functional limitations Baseline: patient reports increased pain and limitation with sitting or standings Goal status: INITIAL     PLAN: PT FREQUENCY: 1x/week   PT DURATION: 8 weeks   PLANNED INTERVENTIONS: Therapeutic exercises, Therapeutic activity, Neuromuscular re-education, Balance training, Gait training, Patient/Family education, Self Care, Joint mobilization, Joint manipulation, Aquatic Therapy, Dry Needling, Spinal manipulation, Spinal mobilization, Cryotherapy, Moist heat, Manual therapy, and Re-evaluation.   PLAN FOR NEXT SESSION: Review HEP and progress PRN, dry needling, manual for lumbar and hips, stretching and initiate core strengthening   Hilda Blades, PT, DPT, LAT, ATC 11/09/22  8:46 AM Phone: [phone] Fax: [phone]

## 2022-11-23 ENCOUNTER — Ambulatory Visit
Admission: EM | Admit: 2022-11-23 | Discharge: 2022-11-23 | Disposition: A | Discharge status: Home / Self Care | Payer: BC Managed Care – PPO | Attending: Physician Assistant | Admitting: Physician Assistant

## 2022-11-23 DIAGNOSIS — R202 Paresthesia of skin: Secondary | ICD-10-CM | POA: Diagnosis not present

## 2022-11-23 DIAGNOSIS — M25512 Pain in left shoulder: Secondary | ICD-10-CM

## 2022-11-23 DIAGNOSIS — M62838 Other muscle spasm: Secondary | ICD-10-CM | POA: Diagnosis not present

## 2022-11-23 MED ORDER — TIZANIDINE HCL 4 MG PO TABS: 4.0000 mg | ORAL_TABLET | Freq: Four times a day (QID) | ORAL | 0 refills | Status: DC | PRN

## 2022-11-23 MED ORDER — PREDNISONE 10 MG PO TABS: 10.0000 mg | ORAL_TABLET | Freq: Three times a day (TID) | ORAL | 0 refills | Status: DC

## 2022-11-23 MED ORDER — TRAMADOL HCL 50 MG PO TABS: 50.0000 mg | ORAL_TABLET | Freq: Four times a day (QID) | ORAL | 0 refills | Status: DC | PRN

## 2022-11-23 NOTE — ED Provider Notes (Signed)
[name]    CSN: [id number] Arrival date & time: 11/23/22  1449      History   Chief Complaint Chief Complaint  Patient presents with   Arm Pain    HPI SALEM Perry is a 42 y.o. female.   42 year old female presents with left shoulder and arm pain.  Patient indicates that since January 14/2024 she has been having progressive left shoulder pain, tenderness, and soreness.  Patient indicates she was seen at the emergency room on January 14 had x-rays of the left shoulder done which were normal.  Patient indicates she is put on several different medicines at that time plus a muscle relaxer she has been taking these but she is not getting relief of her symptoms.  Patient indicates over the past week the pain and discomfort has become worse to where she has a lot of soreness on the top of the left shoulder around the shoulder joint and she indicates she is having numbness, tingling, and weakness of the left arm which has been progressive over the past week.  Patient indicates that she gets pins and needle sensation that goes down to her fingers of the left hand.  She indicates that she is not as strong in the left arm as she was several weeks ago.  She denies any trauma to the neck, shoulder, or arm over the past several weeks.  She is without fever, chills, nausea or vomiting.  She relates she does not have any problems breathing and is not short of breath.     Past Medical History:  Diagnosis Date   Abnormal Pap smear    Anemia    Chlamydia    Gonorrhea    Headache(784.0)    Herpes simplex type 1 infection    Herpes simplex type 2 infection    HPV (human papilloma virus) infection    Trichomonas    Urinary tract infection     Patient Active Problem List   Diagnosis Date Noted   BV (bacterial vaginosis) 06/24/2022   Candida vaginitis 05/06/2022   HSV (herpes simplex virus) infection 03/11/2022   Lipoma of forearm 07/24/2021   Insomnia 06/11/2021   Acute  right-sided low back pain with right-sided sciatica 12/04/2020   Anemia 01/29/2020    Past Surgical History:  Procedure Laterality Date   CESAREAN SECTION  08/31/2011   Procedure: CESAREAN SECTION;  Surgeon: Agnes Lawrence, MD;  Location: [facility];  Service: Gynecology;  Laterality: N/A;  Primary Cesarean section Baby Boy @ 1418   COLPOSCOPY  2010   LEEP     LIPOMA EXCISION Right 08/08/2021   Procedure: RIGHT FOREARM MASS EXCISION;  Surgeon: Sherilyn Cooter, MD;  Location: [facility];  Service: Orthopedics;  Laterality: Right;    OB History     Gravida  3   Para  3   Term  2   Preterm  1   AB      Living  3      SAB      IAB      Ectopic      Multiple      Live Births  3            Home Medications    Prior to Admission medications   Medication Sig Start Date End Date Taking? Authorizing Provider  predniSONE (DELTASONE) 10 MG tablet Take 1 tablet (10 mg total) by mouth in the morning, at noon, and at bedtime. 11/23/22  Yes  Nyoka Lint, PA-C  tiZANidine (ZANAFLEX) 4 MG tablet Take 1 tablet (4 mg total) by mouth every 6 (six) hours as needed for muscle spasms. 11/23/22  Yes Nyoka Lint, PA-C  traMADol (ULTRAM) 50 MG tablet Take 1 tablet (50 mg total) by mouth every 6 (six) hours as needed. 11/23/22  Yes Nyoka Lint, PA-C  lidocaine (LIDODERM) 5 % Place 1 patch onto the skin daily. Remove & Discard patch within 12 hours or as directed by MD 11/08/22   Elgie Congo, MD  lubiprostone (AMITIZA) 8 MCG capsule Take 1 capsule (8 mcg total) by mouth 2 (two) times daily with a meal. 11/06/22   Willia Craze, NP  meloxicam (MOBIC) 15 MG tablet Take 1 tablet (15 mg total) by mouth daily. 10/25/22   McDonald, Stephan Minister, DPM  methocarbamol (ROBAXIN) 500 MG tablet Take 1 tablet (500 mg total) by mouth 2 (two) times daily. 11/08/22   Elgie Congo, MD  naproxen (NAPROSYN) 500 MG tablet Take 1 tablet (500 mg total) by mouth 2 (two) times daily.  11/08/22   Elgie Congo, MD  traZODone (DESYREL) 50 MG tablet Take 1 tablet (50 mg total) by mouth at bedtime. 03/10/22 11/06/22  [name], NP    Family History Family History  Problem Relation Age of Onset   Hypertension Mother    Diabetes Mother    Stroke Mother    Heart disease Brother    Heart attack Brother 66    Social History Social History   Tobacco Use   Smoking status: Former    Packs/day: 0.25    Years: 5.00    Total pack years: 1.25    Types: Cigarettes    Passive exposure: Past   Smokeless tobacco: Never   Tobacco comments:    quit with + preg  Vaping Use   Vaping Use: Never used  Substance Use Topics   Alcohol use: Yes    Alcohol/week: 1.0 standard drink of alcohol    Types: 1 Standard drinks or equivalent per week    Comment: rare   Drug use: No     Allergies   Patient has no known allergies.   Review of Systems Review of Systems  Musculoskeletal:  Positive for joint swelling (left shoulder and arm pain).     Physical Exam Triage Vital Signs ED Triage Vitals [11/23/22 1623]  Enc Vitals Group     BP 100/65     Pulse Rate 71     Resp 17     Temp 97.8 F (36.6 C)     Temp Source Oral     SpO2 96 %     Weight      Height      Head Circumference      Peak Flow      Pain Score 7     Pain Loc      Pain Edu?      Excl. in [place]?    No data found.  Updated Vital Signs BP 100/65 (BP Location: Right Arm)   Pulse 71   Temp 97.8 F (36.6 C) (Oral)   Resp 17   SpO2 96%   Visual Acuity Right Eye Distance:   Left Eye Distance:   Bilateral Distance:    Right Eye Near:   Left Eye Near:    Bilateral Near:     Physical Exam Constitutional:      Appearance: Normal appearance.  Cardiovascular:     Rate and Rhythm: Normal  rate and regular rhythm.     Heart sounds: Normal heart sounds.  Pulmonary:     Effort: Pulmonary effort is normal.     Breath sounds: Normal breath sounds and air entry. No wheezing, rhonchi or rales.   Musculoskeletal:       Arms:     Comments: Left shoulder: Pain is palpated along the left trapezius area and also medial aspect of the shoulder blade without any unusual redness or swelling.  Pain is increased on resistance lateral abduction and resistance forward abduction.  Pain increases with overhead motion greater than 90 degrees.  There is no crepitus with range of motion and stability is intact.  Weakness is present of the left arm that is 2/5 as compared to 5/5 on the right.  Neurological:     Mental Status: She is alert.      UC Treatments / Results  Labs (all labs ordered are listed, but only abnormal results are displayed) Labs Reviewed - No data to display  EKG   Radiology No results found.  Procedures Procedures (including critical care time)  Medications Ordered in UC Medications - No data to display  Initial Impression / Assessment and Plan / UC Course  I have reviewed the triage vital signs and the nursing notes.  Pertinent labs & imaging results that were available during my care of the patient were reviewed by me and considered in my medical decision making (see chart for details).    Plan: The diagnosis will be treated with the following: 1.  Acute left shoulder pain: A.  Ultram, 1-2 every 6-8 hours as needed for acute pain. 2.  Left shoulder muscle spasm: A.  Zanaflex 4 mg every 6 hours as needed for spasm and irritability of muscle. 3.  Paresthesia of his left arm: A.  Prednisone 10 mg every 8 hours to reduce inflammation. B.  Advised ice therapy, 10 minutes on 20 minutes off, 3-4 times throughout the day to help relieve discomfort. 4.  Internal referral to [facility] for evaluation of left shoulder pain with associated left arm numbness, tingling, and weakness. 5.  Advised follow-up PCP or return to urgent care as needed. Final Clinical Impressions(s) / UC Diagnoses   Final diagnoses:  Acute pain of left shoulder  Muscle spasm of  left shoulder  Paresthesia of left arm     Discharge Instructions      Internal referral has been made to [place].  Their office should be calling you within the next 48 hours to arrange an appointment to be seen and evaluated for the left shoulder pain and arm weakness.  Advised take the prednisone 10 mg 3 times a day until completed as this will help decrease the nerve inflammation and irritation. Advised take Zanaflex 4 mg every 6 hours as this is a muscle relaxer and will help relieve discomfort and spasm. Advised take Ultram tablets, 1-2 every 6-8 hours as needed for acute pain.  Advised follow-up PCP or return to urgent care as needed.    ED Prescriptions     Medication Sig Dispense Auth. Provider   tiZANidine (ZANAFLEX) 4 MG tablet Take 1 tablet (4 mg total) by mouth every 6 (six) hours as needed for muscle spasms. 30 tablet Nyoka Lint, PA-C   predniSONE (DELTASONE) 10 MG tablet Take 1 tablet (10 mg total) by mouth in the morning, at noon, and at bedtime. 15 tablet Nyoka Lint, PA-C   traMADol (ULTRAM) 50 MG tablet Take 1 tablet (50  mg total) by mouth every 6 (six) hours as needed. 15 tablet Nyoka Lint, PA-C      I have reviewed the PDMP during this encounter.   Nyoka Lint, PA-C 11/23/22 [phone]

## 2022-11-23 NOTE — Discharge Instructions (Signed)
Internal referral has been made to [facility].  Their office should be calling you within the next 48 hours to arrange an appointment to be seen and evaluated for the left shoulder pain and arm weakness.  Advised take the prednisone 10 mg 3 times a day until completed as this will help decrease the nerve inflammation and irritation. Advised take Zanaflex 4 mg every 6 hours as this is a muscle relaxer and will help relieve discomfort and spasm. Advised take Ultram tablets, 1-2 every 6-8 hours as needed for acute pain.  Advised follow-up PCP or return to urgent care as needed.

## 2022-11-23 NOTE — ED Triage Notes (Signed)
Pt presents with ongoing left arm pain from shoulder to finger tips that is making her hand numb for over a week .

## 2022-11-24 ENCOUNTER — Ambulatory Visit (INDEPENDENT_AMBULATORY_CARE_PROVIDER_SITE_OTHER): Payer: BC Managed Care – PPO

## 2022-11-24 ENCOUNTER — Ambulatory Visit (INDEPENDENT_AMBULATORY_CARE_PROVIDER_SITE_OTHER): Payer: BC Managed Care – PPO | Admitting: Physician Assistant

## 2022-11-24 ENCOUNTER — Encounter: Payer: Self-pay | Admitting: Physician Assistant

## 2022-11-24 DIAGNOSIS — M79602 Pain in left arm: Secondary | ICD-10-CM | POA: Diagnosis not present

## 2022-11-24 DIAGNOSIS — M79601 Pain in right arm: Secondary | ICD-10-CM | POA: Diagnosis not present

## 2022-11-24 DIAGNOSIS — M5412 Radiculopathy, cervical region: Secondary | ICD-10-CM | POA: Diagnosis not present

## 2022-11-24 NOTE — Progress Notes (Signed)
Office Visit Note   Patient: [name]           Date of Birth: 04/13/1981           MRN: [id number] Visit Date: 11/24/2022              Requested by: Nyoka Lint, PA-C [address] [place],  [place] [address] PCP: [name], NP   Assessment & Plan: Visit Diagnoses:  1. Pain in both upper extremities     Plan: Patient is a very pleasant 42 year old woman who presents today with a 2-week history of left shoulder and back pain.  She denies any injury but she does have a repetitive job with her hands and arms.  Denies any previous injury.  She had a shoulder evaluation in the ER on the 14th.  She continues to get increased pain.  Pain is more in her scapula and shoots down her arm with associated numbing and tingling.  She went back to an urgent care yesterday.  They did place her on Zanaflex tramadol and a 6-day course of prednisone 10 mg 3 times daily.  Denies any previous history.  She is very hesitant to move her shoulder because of pain but also reproduces the numbing and tingling and scapular pain with turning her head to the left.  Also with extension of her neck.  Given the radicular findings I think this may be more consistent with a neck issue.  That being said we will have her continue and complete the prednisone dose as well as continue with Zanaflex and tramadol.  Would like to reevaluate her in 1 week.  She understands to contact us if she gets  worse.  If she is better we could consider physical therapy.  If she does not get better and continues with significant symptoms would consider an MRI  Follow-Up Instructions: No follow-ups on file.   Orders:  Orders Placed This Encounter  Procedures   XR Cervical Spine 2 or 3 views   No orders of the defined types were placed in this encounter.     Procedures: No procedures performed   Clinical Data: No additional findings.   Subjective: Chief Complaint  Patient presents with   Left Shoulder -  Pain    HPI pleasant 42 year old woman with a 2-week history of left shoulder pain and pain into her scapula on the left.  Also associated with paresthesias in her hands that come and go.  She has difficulty moving her arm secondary to pain.  Currently started on prednisone.  Denies any fever chills or injury  Review of Systems  All other systems reviewed and are negative.    Objective: Vital Signs: There were no vitals taken for this visit.  Physical Exam Constitutional:      Appearance: Normal appearance.  Pulmonary:     Effort: Pulmonary effort is normal.  Skin:    General: Skin is warm.  Neurological:     Mental Status: She is alert.     Ortho Exam Examination of her neck she has good forward flexion.  When she extends her neck she does have some reproduction of symptoms running down her left arm.  She is okay turning her head to the right but also again reproduction of symptoms with turning her head to the left.  She does have fairly good strength with resisted abduction of her arms though it is painful.  Also good biceps strength.  Her grip strength is  limited to pain and slightly decreased on the left compared to the right.  Her sensation is intact currently she has a strong radial pulse no evidence of any redness or cellulitis. Specialty Comments:  No specialty comments available.  Imaging: XR Cervical Spine 2 or 3 views  Result Date: 11/24/2022 2 views of her cervical spine were reviewed today.  She has straightening of the normal lordotic curve.  Well-maintained joint spacing no evidence of any fracture    PMFS History: Patient Active Problem List   Diagnosis Date Noted   BV (bacterial vaginosis) 06/24/2022   Candida vaginitis 05/06/2022   HSV (herpes simplex virus) infection 03/11/2022   Lipoma of forearm 07/24/2021   Insomnia 06/11/2021   Acute right-sided low back pain with right-sided sciatica 12/04/2020   Anemia 01/29/2020   Past Medical History:   Diagnosis Date   Abnormal Pap smear    Anemia    Chlamydia    Gonorrhea    Headache(784.0)    Herpes simplex type 1 infection    Herpes simplex type 2 infection    HPV (human papilloma virus) infection    Trichomonas    Urinary tract infection     Family History  Problem Relation Age of Onset   Hypertension Mother    Diabetes Mother    Stroke Mother    Heart disease Brother    Heart attack Brother 36    Past Surgical History:  Procedure Laterality Date   CESAREAN SECTION  08/31/2011   Procedure: CESAREAN SECTION;  Surgeon: Agnes Lawrence, MD;  Location: [facility];  Service: Gynecology;  Laterality: N/A;  Primary Cesarean section Baby Boy @ 1418   COLPOSCOPY  2010   LEEP     LIPOMA EXCISION Right 08/08/2021   Procedure: RIGHT FOREARM MASS EXCISION;  Surgeon: Sherilyn Cooter, MD;  Location: [facility];  Service: Orthopedics;  Laterality: Right;   Social History   Occupational History   Not on file  Tobacco Use   Smoking status: Former    Packs/day: 0.25    Years: 5.00    Total pack years: 1.25    Types: Cigarettes    Passive exposure: Past   Smokeless tobacco: Never   Tobacco comments:    quit with + preg  Vaping Use   Vaping Use: Never used  Substance and Sexual Activity   Alcohol use: Yes    Alcohol/week: 1.0 standard drink of alcohol    Types: 1 Standard drinks or equivalent per week    Comment: rare   Drug use: No   Sexual activity: Yes    Partners: Male    Birth control/protection: Condom    Comment: menarche 42yo, sexual debut 42yo

## 2022-12-01 ENCOUNTER — Ambulatory Visit (INDEPENDENT_AMBULATORY_CARE_PROVIDER_SITE_OTHER): Payer: BC Managed Care – PPO | Admitting: Physician Assistant

## 2022-12-01 ENCOUNTER — Encounter: Payer: Self-pay | Admitting: Physician Assistant

## 2022-12-01 ENCOUNTER — Telehealth: Payer: Self-pay | Admitting: Pharmacy Technician

## 2022-12-01 ENCOUNTER — Other Ambulatory Visit (HOSPITAL_COMMUNITY): Payer: Self-pay

## 2022-12-01 DIAGNOSIS — M542 Cervicalgia: Secondary | ICD-10-CM | POA: Diagnosis not present

## 2022-12-01 MED ORDER — DIAZEPAM 10 MG PO TABS: 10.0000 mg | ORAL_TABLET | Freq: Four times a day (QID) | ORAL | 0 refills | Status: DC | PRN

## 2022-12-01 NOTE — Telephone Encounter (Signed)
Patient Advocate Encounter  Received notification from [facility] that prior authorization for LUBIPROSTONE [facility] is required.   PA submitted on 2.6.24 Key [id number]  Status is pending

## 2022-12-01 NOTE — Progress Notes (Signed)
Office Visit Note   Patient: [name]           Date of Birth: [birth year]           MRN: [id number] Visit Date: 12/01/2022              Requested by: [name], NP [address] [place],  [place] [address] PCP: [name], NP  Chief Complaint  Patient presents with   Left Arm - Follow-up   Right Arm - Follow-up      HPI: Patient is a pleasant 42 year old woman with a 6-week history of neck pain and radicular findings running down both of her arms the left being greater.  She has now been seen by the ER urgent care and myself.  She has failed a Medrol Dosepak which only mildly alleviated her symptoms while she is on it.  She has had progressive tingling in her hands.  This is reproduced with turning her neck.  Assessment & Plan: Visit Diagnoses:  1. Cervicalgia     Plan: She has failed 6 weeks of conservative treatment.  She continues to have neck pain that shoots down her left greater than right arm with associated paresthesias given progression of her symptoms and lack of improvement with even prednisone Dosepak I have recommended an MRI of her cervical spine.  In the meantime can continue with anti-inflammatories and muscle relaxants.   she understands if she has worsening symptoms she needs to be seen immediately I have taken her off work until MRI is reviewed  Follow-Up Instructions: Return after MRI.   Ortho Exam  Patient is alert, oriented, no adenopathy, well-dressed, normal affect, normal respiratory effort. She has limited shoulder motion secondary to pain but can go to full overhead extension.  She good has good internal rotation behind the back no real positive impingement findings today.  She does have neck tightness.  Her paresthesias are reproduced with turning her head to the left.  Her left hand has some altered sensation.  Her whole pulses are intact.  Imaging: No results found. No images are attached to the  encounter.  Labs: Lab Results  Component Value Date   HGBA1C 5.0 06/23/2022   HGBA1C 5.0 06/11/2021   REPTSTATUS 08/14/2020 FINAL 08/11/2020   CULT >=100,000 COLONIES/mL ESCHERICHIA COLI (A) 08/11/2020   LABORGA ESCHERICHIA COLI (A) 08/11/2020     Lab Results  Component Value Date   ALBUMIN 4.0 02/11/2022   ALBUMIN 4.4 06/11/2021   ALBUMIN 4.3 01/25/2020    No results found for: "MG" No results found for: "VD25OH"  No results found for: "PREALBUMIN"    Latest Ref Rng & Units 02/11/2022   12:38 PM 02/11/2022   12:10 PM 06/11/2021    3:55 PM  CBC EXTENDED  WBC 4.0 - 10.5 K/uL  7.9  7.1   RBC 3.87 - 5.11 MIL/uL  4.08  4.17   Hemoglobin 12.0 - 15.0 g/dL 13.6  12.7  12.9   HCT 36.0 - 46.0 % 40.0  39.3  38.9   Platelets 150 - 400 K/uL  226  225   NEUT# 1.7 - 7.7 K/uL  5.1    Lymph# 0.7 - 4.0 K/uL  2.5       There is no height or weight on file to calculate BMI.  Orders:  Orders Placed This Encounter  Procedures   MR Cervical Spine w/o contrast   Meds ordered this encounter  Medications   diazepam (VALIUM)  10 MG tablet    Sig: Take 1 tablet (10 mg total) by mouth every 6 (six) hours as needed for anxiety. 20 minutes prior to MRI . Must have a driver    Dispense:  1 tablet    Refill:  0     Procedures: No procedures performed  Clinical Data: No additional findings.  ROS:  All other systems negative, except as noted in the HPI. Review of Systems  Objective: Vital Signs: There were no vitals taken for this visit.  Specialty Comments:  No specialty comments available.  PMFS History: Patient Active Problem List   Diagnosis Date Noted   Radiculopathy, cervical region 11/24/2022   BV (bacterial vaginosis) 06/24/2022   Candida vaginitis 05/06/2022   HSV (herpes simplex virus) infection 03/11/2022   Lipoma of forearm 07/24/2021   Insomnia 06/11/2021   Acute right-sided low back pain with right-sided sciatica 12/04/2020   Anemia 01/29/2020   Past  Medical History:  Diagnosis Date   Abnormal Pap smear    Anemia    Chlamydia    Gonorrhea    Headache(784.0)    Herpes simplex type 1 infection    Herpes simplex type 2 infection    HPV (human papilloma virus) infection    Trichomonas    Urinary tract infection     Family History  Problem Relation Age of Onset   Hypertension Mother    Diabetes Mother    Stroke Mother    Heart disease Brother    Heart attack Brother 21    Past Surgical History:  Procedure Laterality Date   CESAREAN SECTION  08/31/2011   Procedure: CESAREAN SECTION;  Surgeon: Agnes Lawrence, MD;  Location: [facility];  Service: Gynecology;  Laterality: N/A;  Primary Cesarean section Baby Boy @ 1418   COLPOSCOPY  2010   LEEP     LIPOMA EXCISION Right 08/08/2021   Procedure: RIGHT FOREARM MASS EXCISION;  Surgeon: Sherilyn Cooter, MD;  Location: [facility];  Service: Orthopedics;  Laterality: Right;   Social History   Occupational History   Not on file  Tobacco Use   Smoking status: Former    Packs/day: 0.25    Years: 5.00    Total pack years: 1.25    Types: Cigarettes    Passive exposure: Past   Smokeless tobacco: Never   Tobacco comments:    quit with + preg  Vaping Use   Vaping Use: Never used  Substance and Sexual Activity   Alcohol use: Yes    Alcohol/week: 1.0 standard drink of alcohol    Types: 1 Standard drinks or equivalent per week    Comment: rare   Drug use: No   Sexual activity: Yes    Partners: Male    Birth control/protection: Condom    Comment: menarche 42yo, sexual debut 42yo

## 2022-12-03 ENCOUNTER — Other Ambulatory Visit: Payer: BC Managed Care – PPO

## 2022-12-04 NOTE — Telephone Encounter (Signed)
Patient Advocate Encounter  Prior Authorization for LUBIPROSTONE [facility] has been approved.    PA# [id number] Effective dates: 2.6.24 through 12.31.24

## 2022-12-09 ENCOUNTER — Other Ambulatory Visit: Payer: Self-pay | Admitting: Physician Assistant

## 2022-12-09 DIAGNOSIS — M542 Cervicalgia: Secondary | ICD-10-CM

## 2022-12-11 ENCOUNTER — Other Ambulatory Visit: Payer: BC Managed Care – PPO

## 2022-12-12 ENCOUNTER — Ambulatory Visit
Admission: RE | Admit: 2022-12-12 | Discharge: 2022-12-12 | Disposition: A | Discharge status: Home / Self Care | Payer: BC Managed Care – PPO | Source: Ambulatory Visit | Attending: Physician Assistant | Admitting: Physician Assistant

## 2022-12-12 DIAGNOSIS — M542 Cervicalgia: Secondary | ICD-10-CM | POA: Diagnosis not present

## 2022-12-12 DIAGNOSIS — R200 Anesthesia of skin: Secondary | ICD-10-CM | POA: Diagnosis not present

## 2022-12-18 ENCOUNTER — Telehealth: Payer: Self-pay | Admitting: Physician Assistant

## 2022-12-18 NOTE — Telephone Encounter (Signed)
Called patient she advised she is having a second MRI 12/26/2022 and would like to know the results of her first MRI. Patient asked if the results can be given to her over the phone? The number to contact patient is [phone]

## 2022-12-21 ENCOUNTER — Other Ambulatory Visit: Payer: Self-pay | Admitting: Physician Assistant

## 2022-12-21 ENCOUNTER — Ambulatory Visit (INDEPENDENT_AMBULATORY_CARE_PROVIDER_SITE_OTHER): Payer: BC Managed Care – PPO

## 2022-12-21 DIAGNOSIS — Q6671 Congenital pes cavus, right foot: Secondary | ICD-10-CM

## 2022-12-21 DIAGNOSIS — M5412 Radiculopathy, cervical region: Secondary | ICD-10-CM

## 2022-12-21 DIAGNOSIS — M7742 Metatarsalgia, left foot: Secondary | ICD-10-CM

## 2022-12-21 DIAGNOSIS — M7741 Metatarsalgia, right foot: Secondary | ICD-10-CM

## 2022-12-21 DIAGNOSIS — Q6672 Congenital pes cavus, left foot: Secondary | ICD-10-CM

## 2022-12-26 ENCOUNTER — Other Ambulatory Visit: Payer: BC Managed Care – PPO

## 2022-12-28 ENCOUNTER — Ambulatory Visit: Payer: BC Managed Care – PPO | Admitting: Physician Assistant

## 2022-12-28 ENCOUNTER — Ambulatory Visit (INDEPENDENT_AMBULATORY_CARE_PROVIDER_SITE_OTHER): Payer: BC Managed Care – PPO | Admitting: Physical Medicine and Rehabilitation

## 2022-12-28 ENCOUNTER — Encounter: Payer: Self-pay | Admitting: Physical Medicine and Rehabilitation

## 2022-12-28 DIAGNOSIS — M7918 Myalgia, other site: Secondary | ICD-10-CM | POA: Diagnosis not present

## 2022-12-28 DIAGNOSIS — M542 Cervicalgia: Secondary | ICD-10-CM | POA: Diagnosis not present

## 2022-12-28 MED ORDER — METHOCARBAMOL 500 MG PO TABS: 500.0000 mg | ORAL_TABLET | Freq: Three times a day (TID) | ORAL | 0 refills | Status: DC

## 2022-12-28 MED ORDER — MELOXICAM 15 MG PO TABS: 15.0000 mg | ORAL_TABLET | Freq: Every day | ORAL | 0 refills | Status: DC

## 2022-12-28 NOTE — Progress Notes (Signed)
[name] - 42 y.o. female MRN [id number]  Date of birth: [birth year]  Office Visit Note: Visit Date: 12/28/2022 PCP: [name], NP Referred by: [name], NP  Subjective: Chief Complaint  Patient presents with   Neck - Pain   HPI: [name] is a 42 y.o. female who comes in today per the request of Bevely Palmer Persons, PA for evaluation of chronic, worsening and severe left sided neck pain radiating to shoulder and down left arm to fingers. Pain ongoing for several months and worsens with movement and activity. She contributes her pain to lifting heavy objects at work. She describes pain as sharp and stabbing, currently rates as 8 out of 10. Some relief of pain with home exercise regimen, rest and use of medications. Patient was seen in the emergency department multiple times recently for same issue. Good relief of pain with Mobic and Robaxin. States she was unable to tolerate Tizanidine as this medication made her feel "loopy." Recent cervical MRI imaging exhibits borderline facet spurring on the left at C7-T1. No nerve impingement/spinal canal stenosis. Left shoulder x-ray from January negative for fracture/dislocation. Patient currently works in [employer], states she has been out of work for several weeks due to pain. She does wish to return to work. Patient denies focal weakness. No recent trauma or falls.    Review of Systems  Musculoskeletal:  Positive for myalgias and neck pain.  Neurological:  Positive for tingling. Negative for focal weakness and weakness.  All other systems reviewed and are negative.  Otherwise per HPI.  Assessment & Plan: Visit Diagnoses:    ICD-10-CM   1. Neck pain on left side  M54.2 Ambulatory referral to Physical Therapy    2. Cervicalgia  M54.2 Ambulatory referral to Physical Therapy    3. Myofascial pain syndrome  M79.18 Ambulatory referral to Physical Therapy       Plan: Findings:  Chronic, worsening and severe left  sided neck pain radiating to shoulder and down arm to fingers.  Patient continues to have severe pain despite good conservative therapy such as home exercise regimen, rest and use of medications. I did discuss recent cervical MRI with her today using imaging and spine model.  Patient's clinical presentation and exam are consistent with myofascial pain syndrome.  Multiple palpable trigger points noted to left trapezius region upon exam today.  Tenderness noted to left cervical paraspinal region upon palpation.  Next step is to place order for formal physical therapy with our in-house team, I do feel she would benefit from manual treatments and dry needling.  I also discussed medication management with her today and did place prescriptions for Mobic and Robaxin.  I encouraged patient to remain active and did provide her with a note to return to work with no restrictions.  I would like to see her back in approximately 6 weeks for reevaluation.  If her pain persists we will consider performing cervical epidural steroid injection. No red flag symptoms noted upon exam today.     Meds & Orders:  Meds ordered this encounter  Medications   meloxicam (MOBIC) 15 MG tablet    Sig: Take 1 tablet (15 mg total) by mouth daily.    Dispense:  30 tablet    Refill:  0   methocarbamol (ROBAXIN) 500 MG tablet    Sig: Take 1 tablet (500 mg total) by mouth 3 (three) times daily.    Dispense:  90 tablet    Refill:  0    Orders Placed This Encounter  Procedures   Ambulatory referral to Physical Therapy    Follow-up: Return for 6 week follow up .   Procedures: No procedures performed      Clinical History: EXAM: MRI CERVICAL SPINE WITHOUT CONTRAST   TECHNIQUE: Multiplanar, multisequence MR imaging of the cervical spine was performed. No intravenous contrast was administered.   COMPARISON:  None Available.   FINDINGS: Alignment: Straightening of cervical lordosis.   Vertebrae: No fracture, evidence of  discitis, or bone lesion.   Cord: Normal signal and morphology.   Posterior Fossa, vertebral arteries, paraspinal tissues: Negative.   Disc levels:   Disc height and hydration is diffusely preserved. No herniation or neural impingement. Borderline left facet spurring at C7-T1.   IMPRESSION: Borderline facet spurring on the left at C7-T1. Otherwise unremarkable cervical MRI with no impingement.     Electronically Signed   By: Jorje Guild M.D.   On: 12/14/2022 04:25   She reports that she has quit smoking. Her smoking use included cigarettes. She has a 1.25 pack-year smoking history. She has been exposed to tobacco smoke. She has never used smokeless tobacco.  Recent Labs    06/23/22 1644  HGBA1C 5.0    Objective:  VS:  HT:    WT:   BMI:     BP:   HR: bpm  TEMP: ( )  RESP:  Physical Exam Vitals and nursing note reviewed.  HENT:     Head: Normocephalic and atraumatic.     Right Ear: External ear normal.     Left Ear: External ear normal.     Nose: Nose normal.     Mouth/Throat:     Mouth: Mucous membranes are moist.  Eyes:     Extraocular Movements: Extraocular movements intact.  Cardiovascular:     Rate and Rhythm: Normal rate.     Pulses: Normal pulses.  Pulmonary:     Effort: Pulmonary effort is normal.  Abdominal:     General: Abdomen is flat. There is no distension.  Musculoskeletal:        General: Tenderness present.     Cervical back: Tenderness present.     Comments: No discomfort noted with flexion, extension and side-to-side rotation. Patient has good strength in the upper extremities including 5 out of 5 strength in wrist extension, long finger flexion and APB.  There is no atrophy of the hands intrinsically.  Sensation intact bilaterally. Multiple palpable trigger points noted to left trapezius region.   Skin:    General: Skin is warm and dry.     Capillary Refill: Capillary refill takes less than 2 seconds.  Neurological:     General: No  focal deficit present.     Mental Status: She is alert and oriented to person, place, and time.  Psychiatric:        Mood and Affect: Mood normal.        Behavior: Behavior normal.     Ortho Exam  Imaging: No results found.  Past Medical/Family/Surgical/Social History: Medications & Allergies reviewed per EMR, new medications updated. Patient Active Problem List   Diagnosis Date Noted   Radiculopathy, cervical region 11/24/2022   BV (bacterial vaginosis) 06/24/2022   Candida vaginitis 05/06/2022   HSV (herpes simplex virus) infection 03/11/2022   Lipoma of forearm 07/24/2021   Insomnia 06/11/2021   Acute right-sided low back pain with right-sided sciatica 12/04/2020   Anemia 01/29/2020   Past Medical History:  Diagnosis Date  Abnormal Pap smear    Anemia    Chlamydia    Gonorrhea    Headache(784.0)    Herpes simplex type 1 infection    Herpes simplex type 2 infection    HPV (human papilloma virus) infection    Trichomonas    Urinary tract infection    Family History  Problem Relation Age of Onset   Hypertension Mother    Diabetes Mother    Stroke Mother    Heart disease Brother    Heart attack Brother 37   Past Surgical History:  Procedure Laterality Date   CESAREAN SECTION  08/31/2011   Procedure: CESAREAN SECTION;  Surgeon: Agnes Lawrence, MD;  Location: [facility];  Service: Gynecology;  Laterality: N/A;  Primary Cesarean section Baby Boy @ 1418   COLPOSCOPY  2010   LEEP     LIPOMA EXCISION Right 08/08/2021   Procedure: RIGHT FOREARM MASS EXCISION;  Surgeon: Sherilyn Cooter, MD;  Location: [facility];  Service: Orthopedics;  Laterality: Right;   Social History   Occupational History   Not on file  Tobacco Use   Smoking status: Former    Packs/day: 0.25    Years: 5.00    Total pack years: 1.25    Types: Cigarettes    Passive exposure: Past   Smokeless tobacco: Never   Tobacco comments:    quit with + preg  Vaping Use   Vaping  Use: Never used  Substance and Sexual Activity   Alcohol use: Yes    Alcohol/week: 1.0 standard drink of alcohol    Types: 1 Standard drinks or equivalent per week    Comment: rare   Drug use: No   Sexual activity: Yes    Partners: Male    Birth control/protection: Condom    Comment: menarche 42yo, sexual debut 42yo

## 2022-12-28 NOTE — Progress Notes (Signed)
Functional Pain Scale - descriptive words and definitions  Distressing (6)    Pain is present/unable to complete most ADLs limited by pain/sleep is difficult and active distraction is only marginal. Moderate range order  Average Pain 6  Neck pain on left side that radiates down left arm, can cause numbness in arm to the fingers

## 2023-01-01 NOTE — Progress Notes (Signed)
Patient presents today to pick up custom molded foot orthotics recommended by Dr. Sherryle Lis.   Orthotics were dispensed and fit was satisfactory. Reviewed instructions for break-in and wear. Written instructions given to patient.  Patient will follow up as needed.

## 2023-01-06 ENCOUNTER — Ambulatory Visit: Payer: BC Managed Care – PPO | Admitting: Nurse Practitioner

## 2023-01-12 ENCOUNTER — Other Ambulatory Visit: Payer: Self-pay

## 2023-01-12 ENCOUNTER — Ambulatory Visit: Payer: BC Managed Care – PPO | Attending: Surgery

## 2023-01-12 DIAGNOSIS — M7918 Myalgia, other site: Secondary | ICD-10-CM | POA: Insufficient documentation

## 2023-01-12 DIAGNOSIS — M25612 Stiffness of left shoulder, not elsewhere classified: Secondary | ICD-10-CM | POA: Diagnosis not present

## 2023-01-12 DIAGNOSIS — M542 Cervicalgia: Secondary | ICD-10-CM | POA: Diagnosis not present

## 2023-01-12 DIAGNOSIS — M6281 Muscle weakness (generalized): Secondary | ICD-10-CM | POA: Insufficient documentation

## 2023-01-12 NOTE — Therapy (Signed)
OUTPATIENT PHYSICAL THERAPY CERVICAL EVALUATION   Patient Name: [name] MRN: [id number] DOB:1980/11/24, 42 y.o., female Today's Date: 01/13/2023  END OF SESSION:  PT End of Session - 01/12/23 1716     Visit Number 1    Number of Visits 17    Date for PT Re-Evaluation 03/09/23    Authorization Type BCBS / Jackquline [place] MCD    PT Start Time 1615    PT Stop Time 1655    PT Time Calculation (min) 40 min    Activity Tolerance Patient tolerated treatment well    Behavior During Therapy WFL for tasks assessed/performed             Past Medical History:  Diagnosis Date   Abnormal Pap smear    Anemia    Chlamydia    Gonorrhea    Headache(784.0)    Herpes simplex type 1 infection    Herpes simplex type 2 infection    HPV (human papilloma virus) infection    Trichomonas    Urinary tract infection    Past Surgical History:  Procedure Laterality Date   CESAREAN SECTION  08/31/2011   Procedure: CESAREAN SECTION;  Surgeon: Agnes Lawrence, MD;  Location: [facility];  Service: Gynecology;  Laterality: N/A;  Primary Cesarean section Baby Boy @ 1418   COLPOSCOPY  2010   LEEP     LIPOMA EXCISION Right 08/08/2021   Procedure: RIGHT FOREARM MASS EXCISION;  Surgeon: Sherilyn Cooter, MD;  Location: [facility];  Service: Orthopedics;  Laterality: Right;   Patient Active Problem List   Diagnosis Date Noted   Radiculopathy, cervical region 11/24/2022   BV (bacterial vaginosis) 06/24/2022   Candida vaginitis 05/06/2022   HSV (herpes simplex virus) infection 03/11/2022   Lipoma of forearm 07/24/2021   Insomnia 06/11/2021   Acute right-sided low back pain with right-sided sciatica 12/04/2020   Anemia 01/29/2020    PCP: [name], NP  REFERRING PROVIDER: Lorine Bears, NP  REFERRING DIAG:  M54.2 (ICD-10-CM) - Neck pain on left side M54.2 (ICD-10-CM) - Cervicalgia M79.18 (ICD-10-CM) - Myofascial pain syndrome  THERAPY DIAG:  Cervicalgia  Muscle  weakness (generalized)  Stiffness of left shoulder, not elsewhere classified  Rationale for Evaluation and Treatment: Rehabilitation  ONSET DATE: Acute on Chronic  SUBJECTIVE:                                                                                                                                                                                                         SUBJECTIVE STATEMENT: Pt presents to  PT with reports of acute on chronic L sided neck pain that refers into L UE. Has N/T into L hand as well, also promotes increased L hand clumsiness. Denies bowel/bladder changes or saddle anesthesia. Has been gradually getting worse.   PERTINENT HISTORY:  None  PAIN:  Are you having pain?  Yes: NPRS scale: 8/10 Worst: 10/10 Pain location: L side of neck Pain description: sharp, N/T Aggravating factors: rotation, overhead reaching Relieving factors: rest, heat  PRECAUTIONS: None  WEIGHT BEARING RESTRICTIONS: No  FALLS:  Has patient fallen in last 6 months? No  LIVING ENVIRONMENT: Lives with: lives with their family Lives in: House/apartment  OCCUPATION: [employer]  PLOF: Independent  PATIENT GOALS: decrease pain and returning to work  OBJECTIVE:   DIAGNOSTIC FINDINGS:  See imaging  PATIENT SURVEYS:  FOTO: 52% function; 64% predicted  COGNITION: Overall cognitive status: Within functional limits for tasks assessed  SENSATION: Light touch: Impaired - L UE   POSTURE: rounded shoulders and forward head  PALPATION: Severe TTP to L upper trap   CERVICAL ROM:   Active ROM A/PROM (deg) eval  Flexion   Extension   Right lateral flexion   Left lateral flexion   Right rotation 70  Left rotation 55   (Blank rows = not tested)  UPPER EXTREMITY ROM:  Active ROM Right eval Left eval  Shoulder flexion  105  Shoulder extension    Shoulder abduction    Shoulder adduction    Shoulder extension    Shoulder internal rotation     Shoulder external rotation    Elbow flexion    Elbow extension    Wrist flexion    Wrist extension    Wrist ulnar deviation    Wrist radial deviation    Wrist pronation    Wrist supination     (Blank rows = not tested)  UPPER EXTREMITY MMT:  MMT Right eval Left eval  Shoulder flexion    Shoulder extension    Shoulder abduction    Shoulder adduction    Shoulder extension    Shoulder internal rotation    Shoulder external rotation    Middle trapezius    Lower trapezius    Elbow flexion    Elbow extension    Wrist flexion    Wrist extension    Wrist ulnar deviation    Wrist radial deviation    Wrist pronation    Wrist supination    Grip strength 85# 25#   (Blank rows = not tested)  CERVICAL SPECIAL TESTS:  Neck flexor muscle endurance test: 5 sec ULLT: Left positive  TREATMENT: [facility] Adult PT Treatment:                                                DATE: 01/12/2023 Therapeutic Exercise: Row x 10 GTB Supine chin tuck x 5 Seated median nerve glide x 5 L Seated upper trap stretch x 20" L Manual Therapy: STM to L upper trap Positoinal release L upper trap Skilled palpation of trigger points for TPDN Trigger Point Dry Needling Treatment: Pre-treatment instruction: Patient instructed on dry needling rationale, procedures, and possible side effects including pain during treatment (achy,cramping feeling), bruising, drop of blood, lightheadedness, nausea, sweating. Patient Consent Given: No Education handout provided: No Muscles treated: left upper trap  Needle size and number: .30x26mm x 1 Electrical stimulation performed:  No Parameters: N/A Treatment response/outcome: Palpable decrease in muscle tension Post-treatment instructions: Patient instructed to expect possible mild to moderate muscle soreness later today and/or tomorrow. Patient instructed in methods to reduce muscle soreness and to continue prescribed HEP. If patient was dry needled over the lung field,  patient was instructed on signs and symptoms of pneumothorax and, however unlikely, to see immediate medical attention should they occur. Patient was also educated on signs and symptoms of infection and to seek medical attention should they occur. Patient verbalized understanding of these instructions and education.   PATIENT EDUCATION:  Education details: eval findings, FOTO, TPDN, HEP, POC Person educated: Patient Education method: Explanation, Demonstration, and Handouts Education comprehension: verbalized understanding and returned demonstration  HOME EXERCISE PROGRAM: Access Code: [email or web address] URL: [email or web address] Date: 01/12/2023 Prepared by: Octavio Manns  Exercises - Standing Shoulder Row with Anchored Resistance  - 1 x daily - 7 x weekly - 3 sets - 10 reps - green hold - Supine Chin Tuck  - 1 x daily - 7 x weekly - 2 sets - 10 reps - 3 sec hold - Median Nerve Tensioner  - 1 x daily - 7 x weekly - 2 sets - 10 reps - Seated Upper Trapezius Stretch (Mirrored)  - 1 x daily - 7 x weekly - 2 reps - 30 se hold  ASSESSMENT:  CLINICAL IMPRESSION: Patient is a 42 y.o. F who was seen today for physical therapy evaluation and treatment for acute on chronic neck pain. Physical findings are consistent  with referring provider impression as pt has decrease in cervical ROM, L UE ROM, positive neural tension testing, and weakness in DNF musculature. Her FOTO shows she is operating well below PLOF, indicating she would benefit from skilled PT services working on improving strength and function.   OBJECTIVE IMPAIRMENTS: decreased activity tolerance, decreased balance, decreased endurance, decreased mobility, difficulty walking, decreased ROM, decreased strength, impaired sensation, and pain.   ACTIVITY LIMITATIONS: carrying, lifting, and reach over head  PARTICIPATION LIMITATIONS: driving, shopping, community activity, occupation, and yard work  PERSONAL FACTORS: Time since onset  of injury/illness/exacerbation are also affecting patient's functional outcome.   REHAB POTENTIAL: Excellent  CLINICAL DECISION MAKING: Stable/uncomplicated  EVALUATION COMPLEXITY: Low   GOALS: Goals reviewed with patient? No  SHORT TERM GOALS: Target date: 02/02/2023   Pt will be compliant and knowledgeable with initial HEP for improved comfort and carryover Baseline: initial HEP given  Goal status: INITIAL  2.  Pt will self report neck pain no greater than 6/10 for improved comfort and functional ability Baseline: 10/10 at worst Goal status: INITIAL   LONG TERM GOALS: Target date: 03/09/2023  Pt will improve FOTO function score to no less than 64% as proxy for functional improvement Baseline: 52% function Goal status: INITIAL   2.  Pt will self report neck pain no greater than 3/10 for improved comfort and functional ability Baseline: 10/10 at worst Goal status: INITIAL   3.  Pt will improve deep cervical flexor endurance to no less than 15 seconds for improved strength and posture Baseline: 5 seconds Goal status: INITIAL  4.  Pt will improve L cervical rotation to no less than 65 for improved functional ability and comfort Baseline: 55 Goal status: INITIAL   PLAN:  PT FREQUENCY: 1-2x/week  PT DURATION: 8 weeks  PLANNED INTERVENTIONS: Therapeutic exercises, Therapeutic activity, Neuromuscular re-education, Balance training, Gait training, Patient/Family education, Self Care, Joint mobilization, Dry Needling, Electrical stimulation, Cryotherapy, Moist heat, Vasopneumatic device, Manual therapy, and  Re-evaluation  PLAN FOR NEXT SESSION: assess HEP response, progress periscapular strength, manual and TPDN   Ward Chatters, PT 01/13/2023, 8:50 AM

## 2023-01-26 ENCOUNTER — Ambulatory Visit: Payer: BC Managed Care – PPO | Attending: Surgery

## 2023-01-26 DIAGNOSIS — M25612 Stiffness of left shoulder, not elsewhere classified: Secondary | ICD-10-CM

## 2023-01-26 DIAGNOSIS — M6281 Muscle weakness (generalized): Secondary | ICD-10-CM | POA: Insufficient documentation

## 2023-01-26 DIAGNOSIS — M542 Cervicalgia: Secondary | ICD-10-CM | POA: Diagnosis not present

## 2023-01-26 NOTE — Therapy (Addendum)
OUTPATIENT PHYSICAL THERAPY TREATMENT NOTE/DISCHARGE  PHYSICAL THERAPY DISCHARGE SUMMARY  Visits from Start of Care: 2  Current functional level related to goals / functional outcomes: See goals/objective   Remaining deficits: Unable to assess   Education / Equipment: HEP   Patient agrees to discharge. Patient goals were unable to assess. Patient is being discharged due to not returning since the last visit.     Patient Name: [name] MRN: [id number] DOB:[birth year], 42 y.o., female Today's Date: 01/26/2023  PCP: Rema Fendt, NP  REFERRING PROVIDER: Juanda Chance, NP   END OF SESSION:   PT End of Session - 01/26/23 1700     Visit Number 2    Number of Visits 17    Date for PT Re-Evaluation 03/09/23    Authorization Type BCBS / Rolene Arbour MCD    PT Start Time 1700    PT Stop Time 1740    PT Time Calculation (min) 40 min    Activity Tolerance Patient limited by pain    Behavior During Therapy [facility] for tasks assessed/performed             Past Medical History:  Diagnosis Date   Abnormal Pap smear    Anemia    Chlamydia    Gonorrhea    Headache(784.0)    Herpes simplex type 1 infection    Herpes simplex type 2 infection    HPV (human papilloma virus) infection    Trichomonas    Urinary tract infection    Past Surgical History:  Procedure Laterality Date   CESAREAN SECTION  08/31/2011   Procedure: CESAREAN SECTION;  Surgeon: Roseanna Rainbow, MD;  Location: [facility];  Service: Gynecology;  Laterality: N/A;  Primary Cesarean section Baby Boy @ 1418   COLPOSCOPY  2010   LEEP     LIPOMA EXCISION Right 08/08/2021   Procedure: RIGHT FOREARM MASS EXCISION;  Surgeon: Marlyne Beards, MD;  Location: [facility];  Service: Orthopedics;  Laterality: Right;   Patient Active Problem List   Diagnosis Date Noted   Radiculopathy, cervical region 11/24/2022   BV (bacterial vaginosis) 06/24/2022   Candida vaginitis 05/06/2022   HSV (herpes  simplex virus) infection 03/11/2022   Lipoma of forearm 07/24/2021   Insomnia 06/11/2021   Acute right-sided low back pain with right-sided sciatica 12/04/2020   Anemia 01/29/2020    REFERRING DIAG: M54.2 (ICD-10-CM) - Neck pain on left side M54.2 (ICD-10-CM) - Cervicalgia M79.18 (ICD-10-CM) - Myofascial pain syndrome  THERAPY DIAG:  Cervicalgia  Muscle weakness (generalized)  Stiffness of left shoulder, not elsewhere classified  Rationale for Evaluation and Treatment Rehabilitation  PERTINENT HISTORY: None  PRECAUTIONS: None  SUBJECTIVE:  SUBJECTIVE STATEMENT:  Patient reports continued pain in Lt shoulder, mostly at night. She reports HEP compliance.    PAIN:  Are you having pain?  Yes: NPRS scale: 4/10 Worst: 10/10 Pain location: L side of neck Pain description: sharp, N/T Aggravating factors: rotation, overhead reaching Relieving factors: rest, heat   OBJECTIVE: (objective measures completed at initial evaluation unless otherwise dated)   DIAGNOSTIC FINDINGS:  See imaging   PATIENT SURVEYS:  FOTO: 52% function; 64% predicted   COGNITION: Overall cognitive status: Within functional limits for tasks assessed   SENSATION: Light touch: Impaired - L UE    POSTURE: rounded shoulders and forward head   PALPATION: Severe TTP to L upper trap              CERVICAL ROM:    Active ROM A/PROM (deg) eval  Flexion    Extension    Right lateral flexion    Left lateral flexion    Right rotation 70  Left rotation 55   (Blank rows = not tested)   UPPER EXTREMITY ROM:   Active ROM Right eval Left eval  Shoulder flexion   105  Shoulder extension      Shoulder abduction      Shoulder adduction      Shoulder extension      Shoulder internal rotation      Shoulder external  rotation      Elbow flexion      Elbow extension      Wrist flexion      Wrist extension      Wrist ulnar deviation      Wrist radial deviation      Wrist pronation      Wrist supination       (Blank rows = not tested)   UPPER EXTREMITY MMT:   MMT Right eval Left eval  Shoulder flexion      Shoulder extension      Shoulder abduction      Shoulder adduction      Shoulder extension      Shoulder internal rotation      Shoulder external rotation      Middle trapezius      Lower trapezius      Elbow flexion      Elbow extension      Wrist flexion      Wrist extension      Wrist ulnar deviation      Wrist radial deviation      Wrist pronation      Wrist supination      Grip strength 85# 25#   (Blank rows = not tested)   CERVICAL SPECIAL TESTS:  Neck flexor muscle endurance test: 5 sec ULLT: Left positive   TREATMENT: [facility] Adult PT Treatment:                                                DATE: 01/26/23 Therapeutic Exercise: UBE level 1 3'/3' fwd/bwd Rows GTB 2x10 Shoulder extension RTB 2x10 Seated BIL ER RTB 2x10 Upper trap stretch 2x30" BIL Manual: STM to BIL upper traps - gentle, increased pain Positional release BIL upper traps - increased pain SO release Modalities: MHP to Lt shoulder, patient in sitting x10 mins   [facility] Adult PT Treatment:  DATE: 01/12/2023 Therapeutic Exercise: Row x 10 GTB Supine chin tuck x 5 Seated median nerve glide x 5 L Seated upper trap stretch x 20" L Manual Therapy: STM to L upper trap Positoinal release L upper trap Skilled palpation of trigger points for TPDN Trigger Point Dry Needling Treatment: Pre-treatment instruction: Patient instructed on dry needling rationale, procedures, and possible side effects including pain during treatment (achy,cramping feeling), bruising, drop of blood, lightheadedness, nausea, sweating. Patient Consent Given: No Education handout provided:  No Muscles treated: left upper trap  Needle size and number: .30x63mm x 1 Electrical stimulation performed: No Parameters: N/A Treatment response/outcome: Palpable decrease in muscle tension Post-treatment instructions: Patient instructed to expect possible mild to moderate muscle soreness later today and/or tomorrow. Patient instructed in methods to reduce muscle soreness and to continue prescribed HEP. If patient was dry needled over the lung field, patient was instructed on signs and symptoms of pneumothorax and, however unlikely, to see immediate medical attention should they occur. Patient was also educated on signs and symptoms of infection and to seek medical attention should they occur. Patient verbalized understanding of these instructions and education.    PATIENT EDUCATION:  Education details: eval findings, FOTO, TPDN, HEP, POC Person educated: Patient Education method: Explanation, Demonstration, and Handouts Education comprehension: verbalized understanding and returned demonstration   HOME EXERCISE PROGRAM: Access Code: [email or web address] URL: [email or web address] Date: 01/12/2023 Prepared by: Edwinna Areola   Exercises - Standing Shoulder Row with Anchored Resistance  - 1 x daily - 7 x weekly - 3 sets - 10 reps - green hold - Supine Chin Tuck  - 1 x daily - 7 x weekly - 2 sets - 10 reps - 3 sec hold - Median Nerve Tensioner  - 1 x daily - 7 x weekly - 2 sets - 10 reps - Seated Upper Trapezius Stretch (Mirrored)  - 1 x daily - 7 x weekly - 2 reps - 30 se hold   ASSESSMENT:   CLINICAL IMPRESSION: Patient presents to PT with continued pain in Lt shoulder, thought lessened today. She states she did not enjoy the feeling of the TPDN and that the relief was very temporary, pain returned quickly. Session today focused on periscapular and RTC strengthening. Attempted manual techniques, but patient was unable to tolerate due to increased pain, utilized MHP to try to decrease  tension. She reports 8/10 pain post manual and 4/10 post MHP. Patient continues to benefit from skilled PT services and should be progressed as able to improve functional independence.      OBJECTIVE IMPAIRMENTS: decreased activity tolerance, decreased balance, decreased endurance, decreased mobility, difficulty walking, decreased ROM, decreased strength, impaired sensation, and pain.    ACTIVITY LIMITATIONS: carrying, lifting, and reach over head   PARTICIPATION LIMITATIONS: driving, shopping, community activity, occupation, and yard work   PERSONAL FACTORS: Time since onset of injury/illness/exacerbation are also affecting patient's functional outcome.    REHAB POTENTIAL: Excellent   CLINICAL DECISION MAKING: Stable/uncomplicated   EVALUATION COMPLEXITY: Low     GOALS: Goals reviewed with patient? No   SHORT TERM GOALS: Target date: 02/02/2023   Pt will be compliant and knowledgeable with initial HEP for improved comfort and carryover Baseline: initial HEP given  Goal status: Met Pt reports adherence 01/26/23   2.  Pt will self report neck pain no greater than 6/10 for improved comfort and functional ability Baseline: 10/10 at worst Goal status: Ongoing    LONG TERM GOALS: Target date: 03/09/2023   Pt will improve  FOTO function score to no less than 64% as proxy for functional improvement Baseline: 52% function Goal status: INITIAL    2.  Pt will self report neck pain no greater than 3/10 for improved comfort and functional ability Baseline: 10/10 at worst Goal status: INITIAL    3.  Pt will improve deep cervical flexor endurance to no less than 15 seconds for improved strength and posture Baseline: 5 seconds Goal status: INITIAL   4.  Pt will improve L cervical rotation to no less than 65 for improved functional ability and comfort Baseline: 55 Goal status: INITIAL     PLAN:   PT FREQUENCY: 1-2x/week   PT DURATION: 8 weeks   PLANNED INTERVENTIONS: Therapeutic  exercises, Therapeutic activity, Neuromuscular re-education, Balance training, Gait training, Patient/Family education, Self Care, Joint mobilization, Dry Needling, Electrical stimulation, Cryotherapy, Moist heat, Vasopneumatic device, Manual therapy, and Re-evaluation   PLAN FOR NEXT SESSION: assess HEP response, progress periscapular strength, manual and TPDN   Berta Minor, PTA 01/26/2023, 5:37 PM

## 2023-01-28 ENCOUNTER — Ambulatory Visit
Admission: EM | Admit: 2023-01-28 | Discharge: 2023-01-28 | Disposition: A | Discharge status: Home / Self Care | Payer: BC Managed Care – PPO | Attending: Internal Medicine | Admitting: Internal Medicine

## 2023-01-28 DIAGNOSIS — M792 Neuralgia and neuritis, unspecified: Secondary | ICD-10-CM | POA: Diagnosis not present

## 2023-01-28 DIAGNOSIS — N898 Other specified noninflammatory disorders of vagina: Secondary | ICD-10-CM

## 2023-01-28 DIAGNOSIS — N760 Acute vaginitis: Secondary | ICD-10-CM | POA: Diagnosis not present

## 2023-01-28 DIAGNOSIS — M542 Cervicalgia: Secondary | ICD-10-CM | POA: Insufficient documentation

## 2023-01-28 MED ORDER — METHYLPREDNISOLONE 4 MG PO TBPK: ORAL_TABLET | ORAL | 0 refills | Status: DC

## 2023-01-28 MED ORDER — DEXAMETHASONE SODIUM PHOSPHATE 10 MG/ML IJ SOLN
10.0000 mg | Freq: Once | INTRAMUSCULAR | Status: AC
  Administered 2023-01-28: 10 mg via INTRAMUSCULAR

## 2023-01-28 MED ORDER — FLUCONAZOLE 150 MG PO TABS: 150.0000 mg | ORAL_TABLET | ORAL | 0 refills | Status: DC

## 2023-01-28 MED ORDER — METHYLPREDNISOLONE SODIUM SUCC 125 MG IJ SOLR: 80.0000 mg | Freq: Once | INTRAMUSCULAR | Status: DC

## 2023-01-28 NOTE — Discharge Instructions (Addendum)
I gave you a shot of steroid in the clinic to reduce inflammation and pain.  Your worsening discomfort is likely due to recent physical therapy as well as your return to work.  Apply heat to the area of greatest pain 20 minutes on 20 minutes off.   Continue taking muscle relaxer as needed.  Start taking methylprednisolone steroid dosepak tomorrow. This medicine is similar to ibuprofen/naproxen and your meloxicam (mobic), so do not take. Take this medicine as prescribed with food to avoid stomach upset.   Please schedule an appointment with your orthopedic provider for as soon as possible to discuss further pain management while you go through physical therapy.   Take diflucan once today, then again in 3 days to treat suspected vaginal yeast infection. The rest of your swab will come back in the next 2-3 days.   If you develop any new or worsening symptoms or do not improve in the next 2 to 3 days, please return.  If your symptoms are severe, please go to the emergency room.  Follow-up with your primary care provider for further evaluation and management of your symptoms as well as ongoing wellness visits.  I hope you feel better!

## 2023-01-28 NOTE — ED Triage Notes (Signed)
Pt states left arm pain for the past 4 months.  States she is going to PT and taking a muscle relaxer and nothing is helping. Pt also states she is having white thick vaginal discharge.

## 2023-01-28 NOTE — ED Provider Notes (Signed)
[name]    CSN: [id number] Arrival date & time: 01/28/23  1501      History   Chief Complaint Chief Complaint  Patient presents with   Arm Pain    HPI [name] is a 42 y.o. female.   Patient presents to urgent care for evaluation of chronic pain to the left arm that started 4 months ago.  She has had a full workup of this by orthopedics and is currently receiving physical therapy for pain to left arm. Recent MRI shows borderline facet spurring on the left at C7-T1 without impingement. She recently started PT and has had 1-2 sessions most recently 2 days ago. 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  She states work has triggered her pain significantly.  Pain was tolerable with use of Mobic 15 mg daily and Robaxin as needed, however last night pain worsened significantly in intensity to the left shoulder and left deltoid.  No recent trauma or falls/new injuries.  Denies chest pain, shortness of breath, cough, fever/chills, dizziness, headache, new weakness, and rash.  She compares pain that she experienced last night to contractions and labor and states that the pain was throbbing so much that it was worse than labor contractions last night after work.  Nothing is relieving pain.  Pain is currently 9 on a scale 0-10 to the left upper arm.  Recently went to orthopedics on December 28, 2022 and at that time had not received physical therapy and had not gone back to work.  Next appointment with orthopedics is in approximately 10 days.  Also complaining of thick white vaginal discharge with associated vaginal itching.  Reports frequent bacterial vaginitis infections.  No recent new sexual contacts or known exposure to STDs.  She is not diabetic.  No urinary symptoms.  Denies vaginal odor and vaginal rash.  She does have a history of HSV-2 but has not had any recent outbreaks.   Sexually active with condom use.  Last menstrual cycle January 08, 2023, denies chance of pregnancy.    Arm Pain    Past Medical History:  Diagnosis Date   Abnormal Pap smear    Anemia    Chlamydia    Gonorrhea    Headache(784.0)    Herpes simplex type 1 infection    Herpes simplex type 2 infection    HPV (human papilloma virus) infection    Trichomonas    Urinary tract infection     Patient Active Problem List   Diagnosis Date Noted   Radiculopathy, cervical region 11/24/2022   BV (bacterial vaginosis) 06/24/2022   Candida vaginitis 05/06/2022   HSV (herpes simplex virus) infection 03/11/2022   Lipoma of forearm 07/24/2021   Insomnia 06/11/2021   Acute right-sided low back pain with right-sided sciatica 12/04/2020   Anemia 01/29/2020    Past Surgical History:  Procedure Laterality Date   CESAREAN SECTION  08/31/2011   Procedure: CESAREAN SECTION;  Surgeon: Agnes Lawrence, MD;  Location: [facility];  Service: Gynecology;  Laterality: N/A;  Primary Cesarean section Baby Boy @ 1418   COLPOSCOPY  2010   LEEP     LIPOMA EXCISION Right 08/08/2021   Procedure: RIGHT FOREARM MASS EXCISION;  Surgeon: Sherilyn Cooter, MD;  Location: [facility];  Service: Orthopedics;  Laterality: Right;    OB History     Gravida  3  Para  3   Term  2   Preterm  1   AB      Living  3      SAB      IAB      Ectopic      Multiple      Live Births  3            Home Medications    Prior to Admission medications   Medication Sig Start Date End Date Taking? Authorizing Provider  fluconazole (DIFLUCAN) 150 MG tablet Take 1 tablet (150 mg total) by mouth every 3 (three) days. 01/28/23  Yes Talbot Grumbling, FNP  methylPREDNISolone (MEDROL DOSEPAK) 4 MG TBPK tablet Take as prescribed. 01/28/23  Yes Talbot Grumbling, FNP  diazepam (VALIUM) 10 MG tablet Take 1 tablet (10 mg total) by mouth every 6 (six) hours as needed for anxiety. 20 minutes prior  to MRI . Must have a driver Patient not taking: Reported on 12/28/2022 12/01/22   Persons, Bevely Palmer, PA  lubiprostone (AMITIZA) 8 MCG capsule Take 1 capsule (8 mcg total) by mouth 2 (two) times daily with a meal. Patient not taking: Reported on 12/28/2022 11/06/22   Willia Craze, NP  meloxicam (MOBIC) 15 MG tablet Take 1 tablet (15 mg total) by mouth daily. 12/28/22 12/28/23  Lorine Bears, NP  methocarbamol (ROBAXIN) 500 MG tablet Take 1 tablet (500 mg total) by mouth 3 (three) times daily. 12/28/22   Lorine Bears, NP  naproxen (NAPROSYN) 500 MG tablet Take 1 tablet (500 mg total) by mouth 2 (two) times daily. Patient not taking: Reported on 12/28/2022 11/08/22   Elgie Congo, MD  tiZANidine (ZANAFLEX) 4 MG tablet Take 1 tablet (4 mg total) by mouth every 6 (six) hours as needed for muscle spasms. Patient not taking: Reported on 12/28/2022 11/23/22   Nyoka Lint, PA-C  traMADol (ULTRAM) 50 MG tablet Take 1 tablet (50 mg total) by mouth every 6 (six) hours as needed. Patient not taking: Reported on 12/28/2022 11/23/22   Nyoka Lint, PA-C  traZODone (DESYREL) 50 MG tablet Take 1 tablet (50 mg total) by mouth at bedtime. 03/10/22 11/06/22  [name], NP    Family History Family History  Problem Relation Age of Onset   Hypertension Mother    Diabetes Mother    Stroke Mother    Heart disease Brother    Heart attack Brother 78    Social History Social History   Tobacco Use   Smoking status: Former    Packs/day: 0.25    Years: 5.00    Additional pack years: 0.00    Total pack years: 1.25    Types: Cigarettes    Passive exposure: Past   Smokeless tobacco: Never   Tobacco comments:    quit with + preg  Vaping Use   Vaping Use: Never used  Substance Use Topics   Alcohol use: Yes    Alcohol/week: 1.0 standard drink of alcohol    Types: 1 Standard drinks or equivalent per week    Comment: rare   Drug use: No     Allergies   Patient has no known allergies.   Review of  Systems Review of Systems Per HPI  Physical Exam Triage Vital Signs ED Triage Vitals  Enc Vitals Group     BP 01/28/23 1508 112/74     Pulse Rate 01/28/23 1508 74     Resp 01/28/23 1508 16     Temp  01/28/23 1508 98.2 F (36.8 C)     Temp Source 01/28/23 1508 Oral     SpO2 01/28/23 1508 97 %     Weight --      Height --      Head Circumference --      Peak Flow --      Pain Score 01/28/23 1510 9     Pain Loc --      Pain Edu? --      Excl. in [place]? --    No data found.  Updated Vital Signs BP 112/74 (BP Location: Right Arm)   Pulse 74   Temp 98.2 F (36.8 C) (Oral)   Resp 16   LMP 01/08/2023 (Approximate)   SpO2 97%   Visual Acuity Right Eye Distance:   Left Eye Distance:   Bilateral Distance:    Right Eye Near:   Left Eye Near:    Bilateral Near:     Physical Exam Vitals and nursing note reviewed.  Constitutional:      Appearance: She is not ill-appearing or toxic-appearing.  HENT:     Head: Normocephalic and atraumatic.     Right Ear: Hearing, tympanic membrane, ear canal and external ear normal.     Left Ear: Hearing, tympanic membrane, ear canal and external ear normal.     Nose: Nose normal.     Mouth/Throat:     Lips: Pink.     Mouth: Mucous membranes are moist. No injury.     Tongue: No lesions. Tongue does not deviate from midline.     Palate: No mass and lesions.     Pharynx: Oropharynx is clear. Uvula midline. No pharyngeal swelling, oropharyngeal exudate, posterior oropharyngeal erythema or uvula swelling.     Tonsils: No tonsillar exudate or tonsillar abscesses.  Eyes:     General: Lids are normal. Vision grossly intact. Gaze aligned appropriately.     Extraocular Movements: Extraocular movements intact.     Conjunctiva/sclera: Conjunctivae normal.  Neck:     Trachea: Trachea and phonation normal.     Comments: Multiple points of tenderness along the left trapezius muscle to palpation.  Cardiovascular:     Rate and Rhythm: Normal rate and  regular rhythm.     Heart sounds: Normal heart sounds, S1 normal and S2 normal.  Pulmonary:     Effort: Pulmonary effort is normal. No respiratory distress.     Breath sounds: Normal breath sounds and air entry.  Musculoskeletal:     Right shoulder: Normal.     Left shoulder: Tenderness present. No swelling, deformity, effusion, laceration, bony tenderness or crepitus. Normal range of motion. Normal strength. Normal pulse.     Cervical back: Normal range of motion and neck supple. Tenderness present. No edema, erythema, signs of trauma, rigidity or crepitus. Pain with movement and spinous process tenderness present.  Lymphadenopathy:     Cervical: No cervical adenopathy.  Skin:    General: Skin is warm and dry.     Capillary Refill: Capillary refill takes less than 2 seconds.     Findings: No rash.  Neurological:     General: No focal deficit present.     Mental Status: She is alert and oriented to person, place, and time. Mental status is at baseline.     Cranial Nerves: No dysarthria or facial asymmetry.  Psychiatric:        Mood and Affect: Mood normal.        Speech: Speech normal.  Behavior: Behavior normal.        Thought Content: Thought content normal.        Judgment: Judgment normal.      UC Treatments / Results  Labs (all labs ordered are listed, but only abnormal results are displayed) Labs Reviewed  CERVICOVAGINAL ANCILLARY ONLY    EKG   Radiology No results found.  Procedures Procedures (including critical care time)  Medications Ordered in UC Medications  dexamethasone (DECADRON) injection 10 mg (10 mg Intramuscular Given 01/28/23 1542)    Initial Impression / Assessment and Plan / UC Course  I have reviewed the triage vital signs and the nursing notes.  Pertinent labs & imaging results that were available during my care of the patient were reviewed by me and considered in my medical decision making (see chart for details).   1. Radicular pain  in left arm, cervicalgia Chronic pain likely acutely exacerbated by recent return to work, increase in physical activity, and recent physical therapy.  Due to severe pain that has not responded well to her typical medication regimen of Mobic and Robaxin, would like to initiate steroid therapy today to reduce pain and inflammation. Dexamethasone steroid injection IM given, may start methylprednisolone dosepak tomorrow to be taken with food. No mobic/ibuprofen/naproxen when taking steroid. Advised to take with food to avoid stomach upset. May continue use of robaxin as needed for muscle spasm. Recommend heat 20 minutes on 20 minutes off for acute muscle spasm as well. Encouraged to call orthopedic provider to schedule a closer follow-up appointment to discuss pain management while undergoing physical therapy. No red flag signs/symptoms indicating need for referral to ED for workup. Deferred imaging based on atraumatic presentation of pain/injury and stable MSK exam findings.   2. Vaginal itching Presentation consistent with yeast vaginitis. Cervicovaginal swab pending, will treat all other positive results based on swab. Diflucan sent for yeast vaginitis.   Discussed physical exam and available lab work findings in clinic with patient.  Counseled patient regarding appropriate use of medications and potential side effects for all medications recommended or prescribed today. Discussed red flag signs and symptoms of worsening condition,when to call the PCP office, return to urgent care, and when to seek higher level of care in the emergency department. Patient verbalizes understanding and agreement with plan. All questions answered. Patient discharged in stable condition.   Final Clinical Impressions(s) / UC Diagnoses   Final diagnoses:  Radicular pain in left arm  Cervicalgia  Vaginitis and vulvovaginitis  Vaginal itching     Discharge Instructions      I gave you a shot of steroid in the clinic to  reduce inflammation and pain.  Your worsening discomfort is likely due to recent physical therapy as well as your return to work.  Apply heat to the area of greatest pain 20 minutes on 20 minutes off.   Continue taking muscle relaxer as needed.  Start taking methylprednisolone steroid dosepak tomorrow. This medicine is similar to ibuprofen/naproxen and your meloxicam (mobic), so do not take. Take this medicine as prescribed with food to avoid stomach upset.   Please schedule an appointment with your orthopedic provider for as soon as possible to discuss further pain management while you go through physical therapy.   Take diflucan once today, then again in 3 days to treat suspected vaginal yeast infection. The rest of your swab will come back in the next 2-3 days.   If you develop any new or worsening symptoms or do not improve  in the next 2 to 3 days, please return.  If your symptoms are severe, please go to the emergency room.  Follow-up with your primary care provider for further evaluation and management of your symptoms as well as ongoing wellness visits.  I hope you feel better!     ED Prescriptions     Medication Sig Dispense Auth. Provider   methylPREDNISolone (MEDROL DOSEPAK) 4 MG TBPK tablet Take as prescribed. 1 each Talbot Grumbling, FNP   fluconazole (DIFLUCAN) 150 MG tablet Take 1 tablet (150 mg total) by mouth every 3 (three) days. 2 tablet Talbot Grumbling, FNP      PDMP not reviewed this encounter.   Talbot Grumbling, [place] 01/28/23 [phone]

## 2023-02-01 ENCOUNTER — Telehealth (HOSPITAL_COMMUNITY): Payer: Self-pay | Admitting: Emergency Medicine

## 2023-02-01 LAB — CERVICOVAGINAL ANCILLARY ONLY
Bacterial Vaginitis (gardnerella): POSITIVE — AB
Candida Glabrata: NEGATIVE
Candida Vaginitis: NEGATIVE
Chlamydia: NEGATIVE
Comment: NEGATIVE
Comment: NEGATIVE
Comment: NEGATIVE
Comment: NEGATIVE
Comment: NEGATIVE
Comment: NORMAL
Neisseria Gonorrhea: NEGATIVE
Trichomonas: NEGATIVE

## 2023-02-01 MED ORDER — METRONIDAZOLE 500 MG PO TABS: 500.0000 mg | ORAL_TABLET | Freq: Two times a day (BID) | ORAL | 0 refills | Status: DC

## 2023-02-02 ENCOUNTER — Ambulatory Visit: Payer: BC Managed Care – PPO

## 2023-02-04 ENCOUNTER — Ambulatory Visit: Payer: BC Managed Care – PPO

## 2023-02-08 ENCOUNTER — Ambulatory Visit: Payer: BC Managed Care – PPO | Admitting: Physical Medicine and Rehabilitation

## 2023-02-08 NOTE — Therapy (Deleted)
OUTPATIENT PHYSICAL THERAPY TREATMENT NOTE   Patient Name: [name] MRN: [id number] DOB:08-07-1981, 42 y.o., female Today's Date: 02/08/2023  PCP: Rema Fendt, NP  REFERRING PROVIDER: Juanda Chance, NP   END OF SESSION:     Past Medical History:  Diagnosis Date   Abnormal Pap smear    Anemia    Chlamydia    Gonorrhea    Headache(784.0)    Herpes simplex type 1 infection    Herpes simplex type 2 infection    HPV (human papilloma virus) infection    Trichomonas    Urinary tract infection    Past Surgical History:  Procedure Laterality Date   CESAREAN SECTION  08/31/2011   Procedure: CESAREAN SECTION;  Surgeon: Roseanna Rainbow, MD;  Location: [facility];  Service: Gynecology;  Laterality: N/A;  Primary Cesarean section Baby Boy @ 1418   COLPOSCOPY  2010   LEEP     LIPOMA EXCISION Right 08/08/2021   Procedure: RIGHT FOREARM MASS EXCISION;  Surgeon: Marlyne Beards, MD;  Location: [facility];  Service: Orthopedics;  Laterality: Right;   Patient Active Problem List   Diagnosis Date Noted   Radiculopathy, cervical region 11/24/2022   BV (bacterial vaginosis) 06/24/2022   Candida vaginitis 05/06/2022   HSV (herpes simplex virus) infection 03/11/2022   Lipoma of forearm 07/24/2021   Insomnia 06/11/2021   Acute right-sided low back pain with right-sided sciatica 12/04/2020   Anemia 01/29/2020    REFERRING DIAG: M54.2 (ICD-10-CM) - Neck pain on left side M54.2 (ICD-10-CM) - Cervicalgia M79.18 (ICD-10-CM) - Myofascial pain syndrome  THERAPY DIAG:  No diagnosis found.  Rationale for Evaluation and Treatment Rehabilitation  PERTINENT HISTORY: None  PRECAUTIONS: None  SUBJECTIVE:                                                                                                                                                                                      SUBJECTIVE STATEMENT:  Patient reports continued pain in Lt shoulder, mostly at  night. She reports HEP compliance.    PAIN:  Are you having pain?  Yes: NPRS scale: 4/10 Worst: 10/10 Pain location: L side of neck Pain description: sharp, N/T Aggravating factors: rotation, overhead reaching Relieving factors: rest, heat   OBJECTIVE: (objective measures completed at initial evaluation unless otherwise dated)   DIAGNOSTIC FINDINGS:  See imaging   PATIENT SURVEYS:  FOTO: 52% function; 64% predicted   COGNITION: Overall cognitive status: Within functional limits for tasks assessed   SENSATION: Light touch: Impaired - L UE    POSTURE: rounded shoulders and forward head   PALPATION: Severe TTP to L upper trap  CERVICAL ROM:    Active ROM A/PROM (deg) eval  Flexion    Extension    Right lateral flexion    Left lateral flexion    Right rotation 70  Left rotation 55   (Blank rows = not tested)   UPPER EXTREMITY ROM:   Active ROM Right eval Left eval  Shoulder flexion   105  Shoulder extension      Shoulder abduction      Shoulder adduction      Shoulder extension      Shoulder internal rotation      Shoulder external rotation      Elbow flexion      Elbow extension      Wrist flexion      Wrist extension      Wrist ulnar deviation      Wrist radial deviation      Wrist pronation      Wrist supination       (Blank rows = not tested)   UPPER EXTREMITY MMT:   MMT Right eval Left eval  Shoulder flexion      Shoulder extension      Shoulder abduction      Shoulder adduction      Shoulder extension      Shoulder internal rotation      Shoulder external rotation      Middle trapezius      Lower trapezius      Elbow flexion      Elbow extension      Wrist flexion      Wrist extension      Wrist ulnar deviation      Wrist radial deviation      Wrist pronation      Wrist supination      Grip strength 85# 25#   (Blank rows = not tested)   CERVICAL SPECIAL TESTS:  Neck flexor muscle endurance test: 5 sec ULLT:  Left positive   TREATMENT: [facility] Adult PT Treatment:                                                DATE: 01/26/23 Therapeutic Exercise: UBE level 1 3'/3' fwd/bwd Rows GTB 2x10 Shoulder extension RTB 2x10 Seated BIL ER RTB 2x10 Upper trap stretch 2x30" BIL Manual: STM to BIL upper traps - gentle, increased pain Positional release BIL upper traps - increased pain SO release Modalities: MHP to Lt shoulder, patient in sitting x10 mins   [facility] Adult PT Treatment:                                                DATE: 01/12/2023 Therapeutic Exercise: Row x 10 GTB Supine chin tuck x 5 Seated median nerve glide x 5 L Seated upper trap stretch x 20" L Manual Therapy: STM to L upper trap Positoinal release L upper trap Skilled palpation of trigger points for TPDN Trigger Point Dry Needling Treatment: Pre-treatment instruction: Patient instructed on dry needling rationale, procedures, and possible side effects including pain during treatment (achy,cramping feeling), bruising, drop of blood, lightheadedness, nausea, sweating. Patient Consent Given: No Education handout provided: No Muscles treated: left upper trap  Needle size and number: .30x52mm x 1 Electrical stimulation  performed: No Parameters: N/A Treatment response/outcome: Palpable decrease in muscle tension Post-treatment instructions: Patient instructed to expect possible mild to moderate muscle soreness later today and/or tomorrow. Patient instructed in methods to reduce muscle soreness and to continue prescribed HEP. If patient was dry needled over the lung field, patient was instructed on signs and symptoms of pneumothorax and, however unlikely, to see immediate medical attention should they occur. Patient was also educated on signs and symptoms of infection and to seek medical attention should they occur. Patient verbalized understanding of these instructions and education.    PATIENT EDUCATION:  Education details: eval findings,  FOTO, TPDN, HEP, POC Person educated: Patient Education method: Explanation, Demonstration, and Handouts Education comprehension: verbalized understanding and returned demonstration   HOME EXERCISE PROGRAM: Access Code: [email or web address] URL: [email or web address] Date: 01/12/2023 Prepared by: Edwinna Areola   Exercises - Standing Shoulder Row with Anchored Resistance  - 1 x daily - 7 x weekly - 3 sets - 10 reps - green hold - Supine Chin Tuck  - 1 x daily - 7 x weekly - 2 sets - 10 reps - 3 sec hold - Median Nerve Tensioner  - 1 x daily - 7 x weekly - 2 sets - 10 reps - Seated Upper Trapezius Stretch (Mirrored)  - 1 x daily - 7 x weekly - 2 reps - 30 se hold   ASSESSMENT:   CLINICAL IMPRESSION: Patient presents to PT with continued pain in Lt shoulder, thought lessened today. She states she did not enjoy the feeling of the TPDN and that the relief was very temporary, pain returned quickly. Session today focused on periscapular and RTC strengthening. Attempted manual techniques, but patient was unable to tolerate due to increased pain, utilized MHP to try to decrease tension. She reports 8/10 pain post manual and 4/10 post MHP. Patient continues to benefit from skilled PT services and should be progressed as able to improve functional independence.      OBJECTIVE IMPAIRMENTS: decreased activity tolerance, decreased balance, decreased endurance, decreased mobility, difficulty walking, decreased ROM, decreased strength, impaired sensation, and pain.    ACTIVITY LIMITATIONS: carrying, lifting, and reach over head   PARTICIPATION LIMITATIONS: driving, shopping, community activity, occupation, and yard work   PERSONAL FACTORS: Time since onset of injury/illness/exacerbation are also affecting patient's functional outcome.    REHAB POTENTIAL: Excellent   CLINICAL DECISION MAKING: Stable/uncomplicated   EVALUATION COMPLEXITY: Low     GOALS: Goals reviewed with patient? No   SHORT  TERM GOALS: Target date: 02/02/2023   Pt will be compliant and knowledgeable with initial HEP for improved comfort and carryover Baseline: initial HEP given  Goal status: Met Pt reports adherence 01/26/23   2.  Pt will self report neck pain no greater than 6/10 for improved comfort and functional ability Baseline: 10/10 at worst Goal status: Ongoing    LONG TERM GOALS: Target date: 03/09/2023   Pt will improve FOTO function score to no less than 64% as proxy for functional improvement Baseline: 52% function Goal status: INITIAL    2.  Pt will self report neck pain no greater than 3/10 for improved comfort and functional ability Baseline: 10/10 at worst Goal status: INITIAL    3.  Pt will improve deep cervical flexor endurance to no less than 15 seconds for improved strength and posture Baseline: 5 seconds Goal status: INITIAL   4.  Pt will improve L cervical rotation to no less than 65 for improved functional ability and comfort Baseline: 55 Goal  status: INITIAL     PLAN:   PT FREQUENCY: 1-2x/week   PT DURATION: 8 weeks   PLANNED INTERVENTIONS: Therapeutic exercises, Therapeutic activity, Neuromuscular re-education, Balance training, Gait training, Patient/Family education, Self Care, Joint mobilization, Dry Needling, Electrical stimulation, Cryotherapy, Moist heat, Vasopneumatic device, Manual therapy, and Re-evaluation   PLAN FOR NEXT SESSION: assess HEP response, progress periscapular strength, manual and TPDN   Hildred Laser, PT 02/08/2023, 1:46 PM

## 2023-02-10 ENCOUNTER — Encounter: Payer: Self-pay | Admitting: Physical Medicine and Rehabilitation

## 2023-02-10 ENCOUNTER — Ambulatory Visit: Payer: BC Managed Care – PPO

## 2023-02-10 ENCOUNTER — Ambulatory Visit (INDEPENDENT_AMBULATORY_CARE_PROVIDER_SITE_OTHER): Payer: BC Managed Care – PPO | Admitting: Physical Medicine and Rehabilitation

## 2023-02-10 DIAGNOSIS — M542 Cervicalgia: Secondary | ICD-10-CM

## 2023-02-10 DIAGNOSIS — M546 Pain in thoracic spine: Secondary | ICD-10-CM

## 2023-02-10 DIAGNOSIS — M7918 Myalgia, other site: Secondary | ICD-10-CM | POA: Diagnosis not present

## 2023-02-10 DIAGNOSIS — G8929 Other chronic pain: Secondary | ICD-10-CM | POA: Diagnosis not present

## 2023-02-10 NOTE — Progress Notes (Signed)
Functional Pain Scale - descriptive words and definitions  Distracting (5)    Aware of pain/able to complete some ADL's but limited by pain/sleep is affected and active distractions are only slightly useful. Moderate range order  Average Pain 5-6  Neck pain on left side that radiates to the left arm. Feels about the same

## 2023-02-10 NOTE — Progress Notes (Signed)
[name] - 42 y.o. female MRN [id number]  Date of birth: 08-26-1981  Office Visit Note: Visit Date: 02/10/2023 PCP: Rema Fendt, NP Referred by: Rema Fendt, NP  Subjective: Chief Complaint  Patient presents with   Neck - Pain   HPI: [name] is a 42 y.o. female who comes in today for evaluation of chronic, worsening and severe left sided upper back pain radiating up to neck, shoulder and down left arm. Localized area of tenderness noted to left rhomboid region. Pain ongoing for several months, worsens with movement and activity. Describes her pain as sore and sharp, currently rates as 8 out of 10. She has tried home exercise regimen, rest and medications with minimal relief of pain. Unable to tolerate physical therapy and dry needling. States PT caused excruciating pain. Recently evaluated at Urgent Care on 01/28/2023, given intramuscular injection of Dexamethasone and prescribed Medrol dosepack, minimal relief of pain with these medications. Recent cervical MRI imaging exhibits borderline facet spurring on the left at C7-T1, no nerve compression, no spinal canal stenosis noted. Patient states she works at [employer] and pain increases with any kind of physical activity such as lifting and carrying. Patient denies focal weakness, numbness and tingling. No recent trauma or falls.    Review of Systems  Musculoskeletal:  Positive for back pain, myalgias and neck pain.  Neurological:  Negative for tingling, sensory change, focal weakness and weakness.  All other systems reviewed and are negative.  Otherwise per HPI.  Assessment & Plan: Visit Diagnoses:    ICD-10-CM   1. Cervicalgia  M54.2 Trigger Point Inj    2. Chronic left-sided thoracic back pain  M54.6 Trigger Point Inj   G89.29     3. Myofascial pain syndrome  M79.18 Trigger Point Inj       Plan: Findings:  Chronic, worsening and severe left sided upper back pain radiating up to neck,  shoulder and down left arm.  Patient continues to have severe pain despite good conservative therapy such as formal physical therapy, home exercise regimen, rest and use of medications.  Patient's clinical presentation and exam are consistent with myofascial pain syndrome. I also feel there could be a type of central sensitization syndrome such as fibromyalgia working to exacerbate her symptoms.  She is particularly tender to touch, palpable trigger point noted to right rhomboid region. I performed myofascial trigger point injection in the office today, she tolerated well. If good relief of pain can repeat trigger point injections infrequently as needed. Recent MRI imaging of cervical spine looks good for her age, no nerve compression. No indication for cervical epidural steroid injection at this time as I feel this is more myofascial related. I did explain to her that myofascial trigger points can refer pain down the arm. Full range of motion noted to left shoulder without any signs of impingement. Unfortunately she was unable to tolerate physical therapy/dry needling. I encouraged her to remain active and emphasized importance of good sleep patterns, can continue with conservative therapies. Could look at medication management with Cymbalta in the future. She can follow up with [place] as needed. No red flag symptoms noted upon exam today.     Meds & Orders: No orders of the defined types were placed in this encounter.   Orders Placed This Encounter  Procedures   Trigger Point Inj    Follow-up: Return if symptoms worsen or fail to improve.   Procedures: Trigger Point Inj  Date/Time: 02/10/2023  4:28 PM  Performed by: Juanda Chance, NP Authorized by: Juanda Chance, NP   Consent Given by:  Patient Site marked: the procedure site was marked   Timeout: prior to procedure the correct patient, procedure, and site was verified   Indications:  Pain Total # of Trigger Points:  1 Location: back    Needle Size:  25 G Approach:  Dorsal Medications #1:  40 mg triamcinolone acetonide 40 MG/ML; 30 mg ketorolac 30 MG/ML Patient tolerance:  Patient tolerated the procedure well with no immediate complications Comments: Myofacial trigger point injection performed to right rhomboid muscle, needling technique utilized.        Clinical History: EXAM: MRI CERVICAL SPINE WITHOUT CONTRAST   TECHNIQUE: Multiplanar, multisequence MR imaging of the cervical spine was performed. No intravenous contrast was administered.   COMPARISON:  None Available.   FINDINGS: Alignment: Straightening of cervical lordosis.   Vertebrae: No fracture, evidence of discitis, or bone lesion.   Cord: Normal signal and morphology.   Posterior Fossa, vertebral arteries, paraspinal tissues: Negative.   Disc levels:   Disc height and hydration is diffusely preserved. No herniation or neural impingement. Borderline left facet spurring at C7-T1.   IMPRESSION: Borderline facet spurring on the left at C7-T1. Otherwise unremarkable cervical MRI with no impingement.     Electronically Signed   By: Tiburcio Pea M.D.   On: 12/14/2022 04:25   She reports that she has quit smoking. Her smoking use included cigarettes. She has a 1.25 pack-year smoking history. She has been exposed to tobacco smoke. She has never used smokeless tobacco.  Recent Labs    06/23/22 1644  HGBA1C 5.0    Objective:  VS:  HT:    WT:   BMI:     BP:   HR: bpm  TEMP: ( )  RESP:  Physical Exam Vitals and nursing note reviewed.  HENT:     Head: Normocephalic and atraumatic.     Right Ear: External ear normal.     Left Ear: External ear normal.     Nose: Nose normal.     Mouth/Throat:     Mouth: Mucous membranes are moist.  Eyes:     Extraocular Movements: Extraocular movements intact.  Cardiovascular:     Rate and Rhythm: Normal rate.     Pulses: Normal pulses.  Pulmonary:     Effort: Pulmonary effort is normal.   Abdominal:     General: Abdomen is flat. There is no distension.  Musculoskeletal:        General: Tenderness present.     Cervical back: Normal range of motion.     Comments: No discomfort noted with flexion, extension and side-to-side rotation. Patient has good strength in the upper extremities including 5 out of 5 strength in wrist extension, long finger flexion and APB. Shoulder range of motion is full bilaterally without any sign of impingement. There is no atrophy of the hands intrinsically. Sensation intact bilaterally. Palpable trigger point noted to left rhomboid region upon palpation. Negative Hoffman's sign. Negative Spurling's sign.     Skin:    General: Skin is warm and dry.     Capillary Refill: Capillary refill takes less than 2 seconds.  Neurological:     General: No focal deficit present.     Mental Status: She is alert and oriented to person, place, and time.  Psychiatric:        Mood and Affect: Mood normal.  Behavior: Behavior normal.     Ortho Exam  Imaging: No results found.  Past Medical/Family/Surgical/Social History: Medications & Allergies reviewed per EMR, new medications updated. Patient Active Problem List   Diagnosis Date Noted   Radiculopathy, cervical region 11/24/2022   BV (bacterial vaginosis) 06/24/2022   Candida vaginitis 05/06/2022   HSV (herpes simplex virus) infection 03/11/2022   Lipoma of forearm 07/24/2021   Insomnia 06/11/2021   Acute right-sided low back pain with right-sided sciatica 12/04/2020   Anemia 01/29/2020   Past Medical History:  Diagnosis Date   Abnormal Pap smear    Anemia    Chlamydia    Gonorrhea    Headache(784.0)    Herpes simplex type 1 infection    Herpes simplex type 2 infection    HPV (human papilloma virus) infection    Trichomonas    Urinary tract infection    Family History  Problem Relation Age of Onset   Hypertension Mother    Diabetes Mother    Stroke Mother    Heart disease Brother     Heart attack Brother 11   Past Surgical History:  Procedure Laterality Date   CESAREAN SECTION  08/31/2011   Procedure: CESAREAN SECTION;  Surgeon: Roseanna Rainbow, MD;  Location: [facility];  Service: Gynecology;  Laterality: N/A;  Primary Cesarean section Baby Boy @ 1418   COLPOSCOPY  2010   LEEP     LIPOMA EXCISION Right 08/08/2021   Procedure: RIGHT FOREARM MASS EXCISION;  Surgeon: Marlyne Beards, MD;  Location:  [facility];  Service: Orthopedics;  Laterality: Right;   Social History   Occupational History   Not on file  Tobacco Use   Smoking status: Former    Packs/day: 0.25    Years: 5.00    Additional pack years: 0.00    Total pack years: 1.25    Types: Cigarettes    Passive exposure: Past   Smokeless tobacco: Never   Tobacco comments:    quit with + preg  Vaping Use   Vaping Use: Never used  Substance and Sexual Activity   Alcohol use: Yes    Alcohol/week: 1.0 standard drink of alcohol    Types: 1 Standard drinks or equivalent per week    Comment: rare   Drug use: No   Sexual activity: Yes    Partners: Male    Birth control/protection: Condom    Comment: menarche 42yo, sexual debut 42yo

## 2023-02-15 ENCOUNTER — Ambulatory Visit: Payer: BC Managed Care – PPO

## 2023-02-16 MED ORDER — KETOROLAC TROMETHAMINE 30 MG/ML IJ SOLN
30.0000 mg | INTRAMUSCULAR | Status: AC | PRN
  Administered 2023-02-10: 30 mg via INTRA_ARTICULAR

## 2023-02-16 MED ORDER — TRIAMCINOLONE ACETONIDE 40 MG/ML IJ SUSP
40.0000 mg | INTRAMUSCULAR | Status: AC | PRN
  Administered 2023-02-10: 40 mg via INTRAMUSCULAR

## 2023-02-17 ENCOUNTER — Ambulatory Visit: Payer: BC Managed Care – PPO

## 2023-03-15 ENCOUNTER — Ambulatory Visit
Admission: EM | Admit: 2023-03-15 | Discharge: 2023-03-15 | Disposition: A | Discharge status: Home / Self Care | Payer: BC Managed Care – PPO | Attending: Family Medicine | Admitting: Family Medicine

## 2023-03-15 DIAGNOSIS — R300 Dysuria: Secondary | ICD-10-CM

## 2023-03-15 DIAGNOSIS — N898 Other specified noninflammatory disorders of vagina: Secondary | ICD-10-CM

## 2023-03-15 LAB — POCT URINALYSIS DIP (MANUAL ENTRY)
Bilirubin, UA: NEGATIVE
Glucose, UA: NEGATIVE mg/dL
Ketones, POC UA: NEGATIVE mg/dL
Leukocytes, UA: NEGATIVE
Nitrite, UA: POSITIVE — AB
Protein Ur, POC: NEGATIVE mg/dL
Spec Grav, UA: 1.025 (ref 1.010–1.025)
Urobilinogen, UA: 2 E.U./dL — AB
pH, UA: 6 (ref 5.0–8.0)

## 2023-03-15 MED ORDER — SULFAMETHOXAZOLE-TRIMETHOPRIM 800-160 MG PO TABS: 1.0000 | ORAL_TABLET | Freq: Two times a day (BID) | ORAL | 0 refills | Status: AC

## 2023-03-15 MED ORDER — FLUCONAZOLE 150 MG PO TABS: 150.0000 mg | ORAL_TABLET | Freq: Once | ORAL | 0 refills | Status: AC

## 2023-03-15 MED ORDER — METRONIDAZOLE 500 MG PO TABS: 500.0000 mg | ORAL_TABLET | Freq: Two times a day (BID) | ORAL | 0 refills | Status: DC

## 2023-03-15 NOTE — ED Provider Notes (Signed)
[name]    CSN: [id number] Arrival date & time: 03/15/23  1258      History   Chief Complaint Chief Complaint  Patient presents with   Abdominal Pain    HPI YADIRA THEOBALD is a 42 y.o. female.   Patient is here for lower abdominal pain x several days.  Having  white vaginal d/c, some odor, some itching.  H/o BV and yeast.  Thinks it could be either.  Also having urinary frequency, some burning with urination.  Some mild abd pain/back pain.  No fevers/chill.  No n/v.        Past Medical History:  Diagnosis Date   Abnormal Pap smear    Anemia    Chlamydia    Gonorrhea    Headache(784.0)    Herpes simplex type 1 infection    Herpes simplex type 2 infection    HPV (human papilloma virus) infection    Trichomonas    Urinary tract infection     Patient Active Problem List   Diagnosis Date Noted   Radiculopathy, cervical region 11/24/2022   BV (bacterial vaginosis) 06/24/2022   Candida vaginitis 05/06/2022   HSV (herpes simplex virus) infection 03/11/2022   Lipoma of forearm 07/24/2021   Insomnia 06/11/2021   Acute right-sided low back pain with right-sided sciatica 12/04/2020   Anemia 01/29/2020    Past Surgical History:  Procedure Laterality Date   CESAREAN SECTION  08/31/2011   Procedure: CESAREAN SECTION;  Surgeon: Roseanna Rainbow, MD;  Location: [facility];  Service: Gynecology;  Laterality: N/A;  Primary Cesarean section Baby Boy @ 1418   COLPOSCOPY  2010   LEEP     LIPOMA EXCISION Right 08/08/2021   Procedure: RIGHT FOREARM MASS EXCISION;  Surgeon: Marlyne Beards, MD;  Location: [facility];  Service: Orthopedics;  Laterality: Right;    OB History     Gravida  3   Para  3   Term  2   Preterm  1   AB      Living  3      SAB      IAB      Ectopic      Multiple      Live Births  3            Home Medications    Prior to Admission medications   Medication Sig Start Date End Date  Taking? Authorizing Provider  diazepam (VALIUM) 10 MG tablet Take 1 tablet (10 mg total) by mouth every 6 (six) hours as needed for anxiety. 20 minutes prior to MRI . Must have a driver Patient not taking: Reported on 12/28/2022 12/01/22   Persons, [place], PA  fluconazole (DIFLUCAN) 150 MG tablet Take 1 tablet (150 mg total) by mouth every 3 (three) days. 01/28/23   Carlisle Beers, FNP  lubiprostone (AMITIZA) 8 MCG capsule Take 1 capsule (8 mcg total) by mouth 2 (two) times daily with a meal. Patient not taking: Reported on 12/28/2022 11/06/22   Meredith Pel, NP  meloxicam (MOBIC) 15 MG tablet Take 1 tablet (15 mg total) by mouth daily. 12/28/22 12/28/23  Juanda Chance, NP  methocarbamol (ROBAXIN) 500 MG tablet Take 1 tablet (500 mg total) by mouth 3 (three) times daily. 12/28/22   Juanda Chance, NP  methylPREDNISolone (MEDROL DOSEPAK) 4 MG TBPK tablet Take as prescribed. 01/28/23   Carlisle Beers, FNP  metroNIDAZOLE (FLAGYL) 500 MG tablet Take 1 tablet (500 mg  total) by mouth 2 (two) times daily. 02/01/23   Lamptey, Britta Mccreedy, MD  naproxen (NAPROSYN) 500 MG tablet Take 1 tablet (500 mg total) by mouth 2 (two) times daily. Patient not taking: Reported on 12/28/2022 11/08/22   Mardene Sayer, MD  tiZANidine (ZANAFLEX) 4 MG tablet Take 1 tablet (4 mg total) by mouth every 6 (six) hours as needed for muscle spasms. Patient not taking: Reported on 12/28/2022 11/23/22   Ellsworth Lennox, PA-C  traMADol (ULTRAM) 50 MG tablet Take 1 tablet (50 mg total) by mouth every 6 (six) hours as needed. Patient not taking: Reported on 12/28/2022 11/23/22   Ellsworth Lennox, PA-C  traZODone (DESYREL) 50 MG tablet Take 1 tablet (50 mg total) by mouth at bedtime. 03/10/22 11/06/22  Rema Fendt, NP    Family History Family History  Problem Relation Age of Onset   Hypertension Mother    Diabetes Mother    Stroke Mother    Heart disease Brother    Heart attack Brother 54    Social History Social History    Tobacco Use   Smoking status: Former    Packs/day: 0.25    Years: 5.00    Additional pack years: 0.00    Total pack years: 1.25    Types: Cigarettes    Passive exposure: Past   Smokeless tobacco: Never   Tobacco comments:    quit with + preg  Vaping Use   Vaping Use: Never used  Substance Use Topics   Alcohol use: Yes    Alcohol/week: 1.0 standard drink of alcohol    Types: 1 Standard drinks or equivalent per week    Comment: rare   Drug use: No     Allergies   Patient has no known allergies.   Review of Systems Review of Systems  Constitutional: Negative.   HENT: Negative.    Respiratory: Negative.    Cardiovascular: Negative.   Gastrointestinal:  Positive for abdominal pain.  Genitourinary:  Positive for vaginal discharge.  Musculoskeletal: Negative.      Physical Exam Triage Vital Signs ED Triage Vitals [03/15/23 1612]  Enc Vitals Group     BP 119/78     Pulse Rate 82     Resp 18     Temp 98.7 F (37.1 C)     Temp Source Oral     SpO2 98 %     Weight      Height      Head Circumference      Peak Flow      Pain Score      Pain Loc      Pain Edu?      Excl. in GC?    No data found.  Updated Vital Signs BP 119/78 (BP Location: Left Arm)   Pulse 82   Temp 98.7 F (37.1 C) (Oral)   Resp 18   LMP 02/26/2023   SpO2 98%   Visual Acuity Right Eye Distance:   Left Eye Distance:   Bilateral Distance:    Right Eye Near:   Left Eye Near:    Bilateral Near:     Physical Exam Constitutional:      Appearance: She is well-developed.  Cardiovascular:     Rate and Rhythm: Normal rate and regular rhythm.  Pulmonary:     Effort: Pulmonary effort is normal.     Breath sounds: Normal breath sounds.  Abdominal:     General: Abdomen is flat. Bowel sounds are normal.  Palpations: Abdomen is soft.     Tenderness: There is abdominal tenderness in the suprapubic area.  Neurological:     Mental Status: She is alert.      UC Treatments /  Results  Labs (all labs ordered are listed, but only abnormal results are displayed) Labs Reviewed  POCT URINALYSIS DIP (MANUAL ENTRY) - Abnormal; Notable for the following components:      Result Value   Clarity, UA hazy (*)    Blood, UA trace-intact (*)    Urobilinogen, UA 2.0 (*)    Nitrite, UA Positive (*)    All other components within normal limits  CERVICOVAGINAL ANCILLARY ONLY    EKG   Radiology No results found.  Procedures Procedures (including critical care time)  Medications Ordered in UC Medications - No data to display  Initial Impression / Assessment and Plan / UC Course  I have reviewed the triage vital signs and the nursing notes.  Pertinent labs & imaging results that were available during my care of the patient were reviewed by me and considered in my medical decision making (see chart for details).  Final Clinical Impressions(s) / UC Diagnoses   Final diagnoses:  Vaginal discharge  Dysuria     Discharge Instructions      You were seen for various issues.  I have sent out a medication for both yeast and BV today.  Your urine also shows possible infection, so I have sent out an antibiotic to take twice/day x 5 days.  Please increase fluid intake.  Your swab and urine culture will be resulted over the next several days.  If anything else is positive we will call to notify you for treatment.     ED Prescriptions     Medication Sig Dispense Auth. Provider   fluconazole (DIFLUCAN) 150 MG tablet Take 1 tablet (150 mg total) by mouth once for 1 dose. 1 tablet Misao Fackrell, MD   metroNIDAZOLE (FLAGYL) 500 MG tablet Take 1 tablet (500 mg total) by mouth 2 (two) times daily. 14 tablet Adynn Caseres, MD   sulfamethoxazole-trimethoprim (BACTRIM DS) 800-160 MG tablet Take 1 tablet by mouth 2 (two) times daily for 5 days. 10 tablet Jannifer Franklin, MD      PDMP not reviewed this encounter.   Jannifer Franklin, MD 03/15/23 1630

## 2023-03-15 NOTE — ED Triage Notes (Addendum)
Here for lower abdominal pain x 3days. Pt thinks she has BV. Reports vaginal discharge.  Last Menstrual 02/26/2023

## 2023-03-15 NOTE — Discharge Instructions (Signed)
You were seen for various issues.  I have sent out a medication for both yeast and BV today.  Your urine also shows possible infection, so I have sent out an antibiotic to take twice/day x 5 days.  Please increase fluid intake.  Your swab and urine culture will be resulted over the next several days.  If anything else is positive we will call to notify you for treatment.

## 2023-03-16 LAB — CERVICOVAGINAL ANCILLARY ONLY
Bacterial Vaginitis (gardnerella): NEGATIVE
Candida Glabrata: NEGATIVE
Candida Vaginitis: NEGATIVE
Chlamydia: NEGATIVE
Comment: NEGATIVE
Comment: NEGATIVE
Comment: NEGATIVE
Comment: NEGATIVE
Comment: NEGATIVE
Comment: NORMAL
Neisseria Gonorrhea: NEGATIVE
Trichomonas: NEGATIVE

## 2023-03-17 LAB — URINE CULTURE: Culture: >=100000 — AB

## 2023-04-15 ENCOUNTER — Telehealth: Payer: Self-pay | Admitting: Family

## 2023-04-15 NOTE — Telephone Encounter (Signed)
error 

## 2023-04-30 ENCOUNTER — Encounter: Payer: Self-pay | Admitting: Family

## 2023-04-30 ENCOUNTER — Other Ambulatory Visit (HOSPITAL_COMMUNITY)
Admission: RE | Admit: 2023-04-30 | Discharge: 2023-04-30 | Disposition: A | Discharge status: Home / Self Care | Payer: BC Managed Care – PPO | Source: Ambulatory Visit | Attending: Family | Admitting: Family

## 2023-04-30 ENCOUNTER — Ambulatory Visit (INDEPENDENT_AMBULATORY_CARE_PROVIDER_SITE_OTHER): Payer: BC Managed Care – PPO | Admitting: Family

## 2023-04-30 VITALS — BP 103/70 | HR 100 | Temp 98.4°F | Ht 68.0 in | Wt 191.2 lb

## 2023-04-30 DIAGNOSIS — Z131 Encounter for screening for diabetes mellitus: Secondary | ICD-10-CM

## 2023-04-30 DIAGNOSIS — Z13228 Encounter for screening for other metabolic disorders: Secondary | ICD-10-CM | POA: Diagnosis not present

## 2023-04-30 DIAGNOSIS — Z124 Encounter for screening for malignant neoplasm of cervix: Secondary | ICD-10-CM | POA: Insufficient documentation

## 2023-04-30 DIAGNOSIS — Z113 Encounter for screening for infections with a predominantly sexual mode of transmission: Secondary | ICD-10-CM | POA: Insufficient documentation

## 2023-04-30 DIAGNOSIS — Z13220 Encounter for screening for lipoid disorders: Secondary | ICD-10-CM

## 2023-04-30 DIAGNOSIS — Z114 Encounter for screening for human immunodeficiency virus [HIV]: Secondary | ICD-10-CM | POA: Diagnosis not present

## 2023-04-30 DIAGNOSIS — Z0000 Encounter for general adult medical examination without abnormal findings: Secondary | ICD-10-CM

## 2023-04-30 DIAGNOSIS — Z130 Encounter for screening for diseases of the blood and blood-forming organs and certain disorders involving the immune mechanism: Secondary | ICD-10-CM

## 2023-04-30 DIAGNOSIS — R109 Unspecified abdominal pain: Secondary | ICD-10-CM

## 2023-04-30 DIAGNOSIS — Z1231 Encounter for screening mammogram for malignant neoplasm of breast: Secondary | ICD-10-CM

## 2023-04-30 DIAGNOSIS — Z1329 Encounter for screening for other suspected endocrine disorder: Secondary | ICD-10-CM

## 2023-04-30 LAB — POCT URINALYSIS DIP (CLINITEK)
Glucose, UA: NEGATIVE mg/dL
Leukocytes, UA: NEGATIVE
Nitrite, UA: NEGATIVE
POC PROTEIN,UA: NEGATIVE
Spec Grav, UA: >=1.03 — AB (ref 1.010–1.025)
Urobilinogen, UA: >=8 E.U./dL — AB
pH, UA: 6 (ref 5.0–8.0)

## 2023-04-30 LAB — POCT URINE PREGNANCY: Preg Test, Ur: NEGATIVE

## 2023-04-30 NOTE — Progress Notes (Signed)
Patient ID: [name], female    DOB: 07/08/1981  MRN: [id number]  CC: Annual Exam  Subjective: [name] is a 42 y.o. female who presents for annual exam.  Her concerns today include:  - Intermittent abdominal pain. She denies associated red flag symptoms.   Patient Active Problem List   Diagnosis Date Noted   Radiculopathy, cervical region 11/24/2022   BV (bacterial vaginosis) 06/24/2022   Candida vaginitis 05/06/2022   HSV (herpes simplex virus) infection 03/11/2022   Lipoma of forearm 07/24/2021   Insomnia 06/11/2021   Acute right-sided low back pain with right-sided sciatica 12/04/2020   Anemia 01/29/2020     Current Outpatient Medications on File Prior to Visit  Medication Sig Dispense Refill   meloxicam (MOBIC) 15 MG tablet Take 1 tablet (15 mg total) by mouth daily. (Patient not taking: Reported on 04/30/2023) 30 tablet 0   methocarbamol (ROBAXIN) 500 MG tablet Take 1 tablet (500 mg total) by mouth 3 (three) times daily. 90 tablet 0   metroNIDAZOLE (FLAGYL) 500 MG tablet Take 1 tablet (500 mg total) by mouth 2 (two) times daily. (Patient not taking: Reported on 04/30/2023) 14 tablet 0   traZODone (DESYREL) 50 MG tablet Take 1 tablet (50 mg total) by mouth at bedtime. 30 tablet 0   No current facility-administered medications on file prior to visit.    No Known Allergies  Social History   Socioeconomic History   Marital status: Single    Spouse name: Not on file   Number of children: 3   Years of education: Not on file   Highest education level: Not on file  Occupational History   Not on file  Tobacco Use   Smoking status: Former    Packs/day: 0.25    Years: 5.00    Additional pack years: 0.00    Total pack years: 1.25    Types: Cigarettes    Passive exposure: Past   Smokeless tobacco: Never   Tobacco comments:    quit with + preg  Vaping Use   Vaping Use: Never used  Substance and Sexual Activity   Alcohol use: Yes    Alcohol/week: 1.0  standard drink of alcohol    Types: 1 Standard drinks or equivalent per week    Comment: rare   Drug use: No   Sexual activity: Yes    Partners: Male    Birth control/protection: Condom    Comment: menarche 42yo, sexual debut 42yo  Other Topics Concern   Not on file  Social History Narrative   Not on file   Social Determinants of Health   Financial Resource Strain: Not on file  Food Insecurity: Not on file  Transportation Needs: Not on file  Physical Activity: Not on file  Stress: Not on file  Social Connections: Not on file  Intimate Partner Violence: Not on file    Family History  Problem Relation Age of Onset   Hypertension Mother    Diabetes Mother    Stroke Mother    Heart disease Brother    Heart attack Brother 19    Past Surgical History:  Procedure Laterality Date   CESAREAN SECTION  08/31/2011   Procedure: CESAREAN SECTION;  Surgeon: Roseanna Rainbow, MD;  Location: [facility];  Service: Gynecology;  Laterality: N/A;  Primary Cesarean section Baby Boy @ 1418   COLPOSCOPY  2010   LEEP     LIPOMA EXCISION Right 08/08/2021   Procedure: RIGHT FOREARM MASS EXCISION;  Surgeon:  Marlyne Beards, MD;  [facility];  Service: Orthopedics;  Laterality: Right;    ROS: Review of Systems Negative except as stated above  PHYSICAL EXAM: BP 103/70   Pulse 100   Temp 98.4 F (36.9 C) (Oral)   Ht 5\' 8"  (1.727 m)   Wt 191 lb 3.2 oz (86.7 kg)   LMP 04/22/2023 (Exact Date)   SpO2 98%   BMI 29.07 kg/m   Physical Exam HENT:     Head: Normocephalic and atraumatic.     Right Ear: Tympanic membrane, ear canal and external ear normal.     Left Ear: Tympanic membrane and external ear normal.     Nose: Nose normal.     Mouth/Throat:     Mouth: Mucous membranes are moist.     Pharynx: Oropharynx is clear.  Eyes:     Extraocular Movements: Extraocular movements intact.     Conjunctiva/sclera: Conjunctivae normal.     Pupils: Pupils are equal,  round, and reactive to light.  Cardiovascular:     Rate and Rhythm: Normal rate and regular rhythm.     Pulses: Normal pulses.     Heart sounds: Normal heart sounds.  Pulmonary:     Effort: Pulmonary effort is normal.     Breath sounds: Normal breath sounds.  Chest:     Comments: Patient declined.  Abdominal:     General: Bowel sounds are normal.     Palpations: Abdomen is soft.  Genitourinary:    General: Normal vulva.     Vagina: Normal.     Cervix: Normal.     Uterus: Normal.      Adnexa: Right adnexa normal and left adnexa normal.     Comments: Jessie Foot, CMA present.  Musculoskeletal:        General: Normal range of motion.     Right shoulder: Normal.     Left shoulder: Normal.     Right upper arm: Normal.     Left upper arm: Normal.     Right elbow: Normal.     Left elbow: Normal.     Right forearm: Normal.     Left forearm: Normal.     Right wrist: Normal.     Left wrist: Normal.     Right hand: Normal.     Left hand: Normal.     Cervical back: Normal, normal range of motion and neck supple.     Thoracic back: Normal.     Lumbar back: Normal.     Right hip: Normal.     Left hip: Normal.     Right upper leg: Normal.     Left upper leg: Normal.     Right knee: Normal.     Left knee: Normal.     Right lower leg: Normal.     Left lower leg: Normal.     Right ankle: Normal.     Left ankle: Normal.     Right foot: Normal.     Left foot: Normal.  Skin:    General: Skin is warm and dry.     Capillary Refill: Capillary refill takes less than 2 seconds.  Neurological:     General: No focal deficit present.     Mental Status: She is alert and oriented to person, place, and time.  Psychiatric:        Mood and Affect: Mood normal.        Behavior: Behavior normal.     ASSESSMENT AND PLAN: 1.  Annual physical exam - Counseled on 150 minutes of exercise per week as tolerated, healthy eating (including decreased daily intake of saturated fats, cholesterol,  added sugars, sodium), STI prevention, and routine healthcare maintenance.  2. Screening for metabolic disorder - Routine screening.  - CMP14+EGFR  3. Screening for deficiency anemia - Routine screening.  - CBC  4. Diabetes mellitus screening - Routine screening.  - Hemoglobin A1c  5. Screening cholesterol level - Routine screening.  - Lipid panel  6. Thyroid disorder screen - Routine screening.  - TSH  7. Encounter for screening mammogram for malignant neoplasm of breast - Routine screening.  - MM Digital Screening; Future  8. Pap smear for cervical cancer screening - Routine screening.  - Cytology - PAP([facility])  9. Routine screening for STI (sexually transmitted infection) - Routine screening.  - Cervicovaginal ancillary only  10. Encounter for screening for HIV - Routine screening.  - HIV antibody (with reflex)  11. Abdominal pain, unspecified abdominal location - Routine screening.  - Amylase - Lipase - POCT urine pregnancy - POCT URINALYSIS DIP (CLINITEK)   Patient was given the opportunity to ask questions.  Patient verbalized understanding of the plan and was able to repeat key elements of the plan. Patient was given clear instructions to go to Emergency Department or return to medical center if symptoms don't improve, worsen, or new problems develop.The patient verbalized understanding.   Orders Placed This Encounter  Procedures   MM Digital Screening   CBC   Lipid panel   TSH   CMP14+EGFR   Hemoglobin A1c   Amylase   Lipase   HIV antibody (with reflex)   POCT URINALYSIS DIP (CLINITEK)   POCT urine pregnancy   Return in about 1 year (around 04/29/2024) for Physical per patient preference.  Rema Fendt, NP

## 2023-04-30 NOTE — Progress Notes (Signed)
Pt wants to know about stomach pain that she has in her stomach from time to time.

## 2023-04-30 NOTE — Patient Instructions (Signed)
Preventive Care 40-42 Years Old, Female Preventive care refers to lifestyle choices and visits with your health care provider that can promote health and wellness. Preventive care visits are also called wellness exams. What can I expect for my preventive care visit? Counseling Your health care provider may ask you questions about your: Medical history, including: Past medical problems. Family medical history. Pregnancy history. Current health, including: Menstrual cycle. Method of birth control. Emotional well-being. Home life and relationship well-being. Sexual activity and sexual health. Lifestyle, including: Alcohol, nicotine or tobacco, and drug use. Access to firearms. Diet, exercise, and sleep habits. Work and work environment. Sunscreen use. Safety issues such as seatbelt and bike helmet use. Physical exam Your health care provider will check your: Height and weight. These may be used to calculate your BMI (body mass index). BMI is a measurement that tells if you are at a healthy weight. Waist circumference. This measures the distance around your waistline. This measurement also tells if you are at a healthy weight and may help predict your risk of certain diseases, such as type 2 diabetes and high blood pressure. Heart rate and blood pressure. Body temperature. Skin for abnormal spots. What immunizations do I need?  Vaccines are usually given at various ages, according to a schedule. Your health care provider will recommend vaccines for you based on your age, medical history, and lifestyle or other factors, such as travel or where you work. What tests do I need? Screening Your health care provider may recommend screening tests for certain conditions. This may include: Lipid and cholesterol levels. Diabetes screening. This is done by checking your blood sugar (glucose) after you have not eaten for a while (fasting). Pelvic exam and Pap test. Hepatitis B test. Hepatitis C  test. HIV (human immunodeficiency virus) test. STI (sexually transmitted infection) testing, if you are at risk. Lung cancer screening. Colorectal cancer screening. Mammogram. Talk with your health care provider about when you should start having regular mammograms. This may depend on whether you have a family history of breast cancer. BRCA-related cancer screening. This may be done if you have a family history of breast, ovarian, tubal, or peritoneal cancers. Bone density scan. This is done to screen for osteoporosis. Talk with your health care provider about your test results, treatment options, and if necessary, the need for more tests. Follow these instructions at home: Eating and drinking  Eat a diet that includes fresh fruits and vegetables, whole grains, lean protein, and low-fat dairy products. Take vitamin and mineral supplements as recommended by your health care provider. Do not drink alcohol if: Your health care provider tells you not to drink. You are pregnant, may be pregnant, or are planning to become pregnant. If you drink alcohol: Limit how much you have to 0-1 drink a day. Know how much alcohol is in your drink. In the [place] one drink equals one 12 oz bottle of beer (355 mL), one 5 oz glass of wine (148 mL), or one 1 oz glass of hard liquor (44 mL). Lifestyle Brush your teeth every morning and night with fluoride toothpaste. Floss one time each day. Exercise for at least 30 minutes 5 or more days each week. Do not use any products that contain nicotine or tobacco. These products include cigarettes, chewing tobacco, and vaping devices, such as e-cigarettes. If you need help quitting, ask your health care provider. Do not use drugs. If you are sexually active, practice safe sex. Use a condom or other form of protection to   prevent STIs. If you do not wish to become pregnant, use a form of birth control. If you plan to become pregnant, see your health care provider for a  prepregnancy visit. Take aspirin only as told by your health care provider. Make sure that you understand how much to take and what form to take. Work with your health care provider to find out whether it is safe and beneficial for you to take aspirin daily. Find healthy ways to manage stress, such as: Meditation, yoga, or listening to music. Journaling. Talking to a trusted person. Spending time with friends and family. Minimize exposure to UV radiation to reduce your risk of skin cancer. Safety Always wear your seat belt while driving or riding in a vehicle. Do not drive: If you have been drinking alcohol. Do not ride with someone who has been drinking. When you are tired or distracted. While texting. If you have been using any mind-altering substances or drugs. Wear a helmet and other protective equipment during sports activities. If you have firearms in your house, make sure you follow all gun safety procedures. Seek help if you have been physically or sexually abused. What's next? Visit your health care provider once a year for an annual wellness visit. Ask your health care provider how often you should have your eyes and teeth checked. Stay up to date on all vaccines. This information is not intended to replace advice given to you by your health care provider. Make sure you discuss any questions you have with your health care provider. Document Revised: 04/09/2021 Document Reviewed: 04/09/2021 Elsevier Patient Education  2024 Elsevier Inc.  

## 2023-05-01 LAB — CMP14+EGFR
ALT: 9 IU/L (ref 0–32)
AST: 15 IU/L (ref 0–40)
Albumin: 4.3 g/dL (ref 3.9–4.9)
Alkaline Phosphatase: 76 IU/L (ref 44–121)
BUN/Creatinine Ratio: 11 (ref 9–23)
BUN: 10 mg/dL (ref 6–24)
Bilirubin Total: 0.2 mg/dL (ref 0.0–1.2)
CO2: 25 mmol/L (ref 20–29)
Calcium: 9.1 mg/dL (ref 8.7–10.2)
Chloride: 101 mmol/L (ref 96–106)
Creatinine, Ser: 0.92 mg/dL (ref 0.57–1.00)
Globulin, Total: 2.6 g/dL (ref 1.5–4.5)
Glucose: 81 mg/dL (ref 70–99)
Potassium: 4 mmol/L (ref 3.5–5.2)
Sodium: 138 mmol/L (ref 134–144)
Total Protein: 6.9 g/dL (ref 6.0–8.5)
eGFR: 80 mL/min/{1.73_m2} (ref >59)

## 2023-05-01 LAB — AMYLASE: Amylase: 87 U/L (ref 31–110)

## 2023-05-01 LAB — LIPID PANEL
Chol/HDL Ratio: 4 ratio (ref 0.0–4.4)
Cholesterol, Total: 198 mg/dL (ref 100–199)
HDL: 50 mg/dL (ref >39)
LDL Chol Calc (NIH): 114 mg/dL — ABNORMAL HIGH (ref 0–99)
Triglycerides: 196 mg/dL — ABNORMAL HIGH (ref 0–149)
VLDL Cholesterol Cal: 34 mg/dL (ref 5–40)

## 2023-05-01 LAB — CBC
Hematocrit: 35.9 % (ref 34.0–46.6)
Hemoglobin: 12.1 g/dL (ref 11.1–15.9)
MCH: 31.1 pg (ref 26.6–33.0)
MCHC: 33.7 g/dL (ref 31.5–35.7)
MCV: 92 fL (ref 79–97)
Platelets: 271 10*3/uL (ref 150–450)
RBC: 3.89 x10E6/uL (ref 3.77–5.28)
RDW: 11.8 % (ref 11.7–15.4)
WBC: 6.8 10*3/uL (ref 3.4–10.8)

## 2023-05-01 LAB — LIPASE: Lipase: 28 U/L (ref 14–72)

## 2023-05-01 LAB — HEMOGLOBIN A1C
Est. average glucose Bld gHb Est-mCnc: 100 mg/dL
Hgb A1c MFr Bld: 5.1 % (ref 4.8–5.6)

## 2023-05-01 LAB — TSH: TSH: 1.22 u[IU]/mL (ref 0.450–4.500)

## 2023-05-01 LAB — HIV ANTIBODY (ROUTINE TESTING W REFLEX): HIV Screen 4th Generation wRfx: NONREACTIVE

## 2023-05-03 LAB — CERVICOVAGINAL ANCILLARY ONLY
Bacterial Vaginitis (gardnerella): NEGATIVE
Candida Glabrata: NEGATIVE
Candida Vaginitis: NEGATIVE
Chlamydia: NEGATIVE
Comment: NEGATIVE
Comment: NEGATIVE
Comment: NEGATIVE
Comment: NEGATIVE
Comment: NEGATIVE
Comment: NORMAL
Neisseria Gonorrhea: NEGATIVE
Trichomonas: NEGATIVE

## 2023-05-03 LAB — CYTOLOGY - PAP
Adequacy: ABSENT
Comment: NEGATIVE
Diagnosis: NEGATIVE
High risk HPV: NEGATIVE

## 2023-05-19 ENCOUNTER — Ambulatory Visit
Admission: RE | Admit: 2023-05-19 | Discharge: 2023-05-19 | Disposition: A | Discharge status: Home / Self Care | Payer: BC Managed Care – PPO | Source: Ambulatory Visit | Attending: Family | Admitting: Family

## 2023-05-19 DIAGNOSIS — Z1231 Encounter for screening mammogram for malignant neoplasm of breast: Secondary | ICD-10-CM

## 2023-07-01 NOTE — Telephone Encounter (Signed)
See routing comment(s) for message information.

## 2023-07-21 ENCOUNTER — Ambulatory Visit (INDEPENDENT_AMBULATORY_CARE_PROVIDER_SITE_OTHER): Payer: BC Managed Care – PPO | Admitting: Family

## 2023-07-21 ENCOUNTER — Encounter: Payer: Self-pay | Admitting: Family

## 2023-07-21 ENCOUNTER — Other Ambulatory Visit (HOSPITAL_COMMUNITY)
Admission: RE | Admit: 2023-07-21 | Discharge: 2023-07-21 | Disposition: A | Discharge status: Home / Self Care | Payer: BC Managed Care – PPO | Source: Ambulatory Visit | Attending: Family | Admitting: Family

## 2023-07-21 VITALS — BP 99/69 | HR 81 | Temp 98.7°F | Ht 68.0 in | Wt 194.6 lb

## 2023-07-21 DIAGNOSIS — N898 Other specified noninflammatory disorders of vagina: Secondary | ICD-10-CM

## 2023-07-21 DIAGNOSIS — B9689 Other specified bacterial agents as the cause of diseases classified elsewhere: Secondary | ICD-10-CM | POA: Insufficient documentation

## 2023-07-21 DIAGNOSIS — Z113 Encounter for screening for infections with a predominantly sexual mode of transmission: Secondary | ICD-10-CM | POA: Insufficient documentation

## 2023-07-21 DIAGNOSIS — B3731 Acute candidiasis of vulva and vagina: Secondary | ICD-10-CM | POA: Diagnosis not present

## 2023-07-21 DIAGNOSIS — N760 Acute vaginitis: Secondary | ICD-10-CM | POA: Diagnosis not present

## 2023-07-21 LAB — POCT URINALYSIS DIP (CLINITEK)
Bilirubin, UA: NEGATIVE
Glucose, UA: NEGATIVE mg/dL
Ketones, POC UA: NEGATIVE mg/dL
Leukocytes, UA: NEGATIVE
Nitrite, UA: NEGATIVE
POC PROTEIN,UA: NEGATIVE
Spec Grav, UA: 1.015 (ref 1.010–1.025)
Urobilinogen, UA: 1 E.U./dL
pH, UA: 7 (ref 5.0–8.0)

## 2023-07-21 MED ORDER — FLUCONAZOLE 150 MG PO TABS
150.0000 mg | ORAL_TABLET | ORAL | 0 refills | Status: AC
Start: 2023-07-21 — End: 2023-07-28

## 2023-07-21 MED ORDER — METRONIDAZOLE 500 MG PO TABS
500.0000 mg | ORAL_TABLET | Freq: Two times a day (BID) | ORAL | 0 refills | Status: AC
Start: 2023-07-21 — End: 2023-07-28

## 2023-07-21 NOTE — Progress Notes (Signed)
Patient ID: [name], female    DOB: 24-Aug-1981  MRN: [id number]  CC: Yeast Infection/Bacterial Vaginitis   Subjective: [name] is a 42 y.o. female who presents for yeast infection/bacterial vaginitis.   Her concerns today include:  Reports vaginal odor and vaginal discharge. History of frequent yeast infection/bacterial vaginitis. She was seen by Gynecology and states she was told everything was normal.    Patient Active Problem List   Diagnosis Date Noted   Radiculopathy, cervical region 11/24/2022   BV (bacterial vaginosis) 06/24/2022   Candida vaginitis 05/06/2022   HSV (herpes simplex virus) infection 03/11/2022   Lipoma of forearm 07/24/2021   Insomnia 06/11/2021   Acute right-sided low back pain with right-sided sciatica 12/04/2020   Anemia 01/29/2020     Current Outpatient Medications on File Prior to Visit  Medication Sig Dispense Refill   traZODone (DESYREL) 50 MG tablet Take 1 tablet (50 mg total) by mouth at bedtime. 30 tablet 0   No current facility-administered medications on file prior to visit.    No Known Allergies  Social History   Socioeconomic History   Marital status: Single    Spouse name: Not on file   Number of children: 3   Years of education: Not on file   Highest education level: Not on file  Occupational History   Not on file  Tobacco Use   Smoking status: Former    Current packs/day: 0.25    Average packs/day: 0.3 packs/day for 5.0 years (1.3 ttl pk-yrs)    Types: Cigarettes    Passive exposure: Past   Smokeless tobacco: Never   Tobacco comments:    quit with + preg  Vaping Use   Vaping status: Never Used  Substance and Sexual Activity   Alcohol use: Yes    Alcohol/week: 1.0 standard drink of alcohol    Types: 1 Standard drinks or equivalent per week    Comment: rare   Drug use: No   Sexual activity: Yes    Partners: Male    Birth control/protection: Condom    Comment: menarche 42yo, sexual debut 42yo   Other Topics Concern   Not on file  Social History Narrative   Not on file   Social Determinants of Health   Financial Resource Strain: Not on file  Food Insecurity: Not on file  Transportation Needs: Not on file  Physical Activity: Not on file  Stress: Not on file  Social Connections: Not on file  Intimate Partner Violence: Not on file    Family History  Problem Relation Age of Onset   Hypertension Mother    Diabetes Mother    Stroke Mother    Heart disease Brother    Heart attack Brother 42    Past Surgical History:  Procedure Laterality Date   CESAREAN SECTION  08/31/2011   Procedure: CESAREAN SECTION;  Surgeon: Roseanna Rainbow, MD;  Location: [facility];  Service: Gynecology;  Laterality: N/A;  Primary Cesarean section Baby Boy @ 1418   COLPOSCOPY  2010   LEEP     LIPOMA EXCISION Right 08/08/2021   Procedure: RIGHT FOREARM MASS EXCISION;  Surgeon: Marlyne Beards, MD;  Location: [facility];  Service: Orthopedics;  Laterality: Right;    ROS: Review of Systems Negative except as stated above  PHYSICAL EXAM: BP 99/69   Pulse 81   Temp 98.7 F (37.1 C) (Oral)   Ht 5\' 8"  (1.727 m)   Wt 194 lb 9.6 oz (88.3 kg)  LMP 07/13/2023 (Exact Date)   SpO2 96%   BMI 29.59 kg/m   Physical Exam HENT:     Head: Normocephalic and atraumatic.     Nose: Nose normal.     Mouth/Throat:     Mouth: Mucous membranes are moist.     Pharynx: Oropharynx is clear.  Eyes:     Extraocular Movements: Extraocular movements intact.     Conjunctiva/sclera: Conjunctivae normal.     Pupils: Pupils are equal, round, and reactive to light.  Cardiovascular:     Rate and Rhythm: Normal rate and regular rhythm.     Pulses: Normal pulses.     Heart sounds: Normal heart sounds.  Pulmonary:     Effort: Pulmonary effort is normal.     Breath sounds: Normal breath sounds.  Musculoskeletal:        General: Normal range of motion.     Cervical back: Normal range of motion  and neck supple.  Neurological:     General: No focal deficit present.     Mental Status: She is alert and oriented to person, place, and time.  Psychiatric:        Mood and Affect: Mood normal.        Behavior: Behavior normal.     ASSESSMENT AND PLAN: 1. Vaginal odor 2. Vaginal discharge - Empirical treatment with Metronidazole and Fluconazole. Counseled on medication adherence.  - Routine screening.  - Referral to Gynecology for second opinion.  - Follow-up with primary provider as scheduled.  - metroNIDAZOLE (FLAGYL) 500 MG tablet; Take 1 tablet (500 mg total) by mouth 2 (two) times daily for 7 days.  Dispense: 14 tablet; Refill: 0 - fluconazole (DIFLUCAN) 150 MG tablet; Take 1 tablet (150 mg total) by mouth every 3 (three) days for 3 doses.  Dispense: 3 tablet; Refill: 0 - POCT URINALYSIS DIP (CLINITEK); Future - Ambulatory referral to Gynecology - Cervicovaginal ancillary only; Future    Patient was given the opportunity to ask questions.  Patient verbalized understanding of the plan and was able to repeat key elements of the plan. Patient was given clear instructions to go to Emergency Department or return to medical center if symptoms don't improve, worsen, or new problems develop.The patient verbalized understanding.   Orders Placed This Encounter  Procedures   Ambulatory referral to Gynecology   POCT URINALYSIS DIP (CLINITEK)     Requested Prescriptions   Signed Prescriptions Disp Refills   metroNIDAZOLE (FLAGYL) 500 MG tablet 14 tablet 0    Sig: Take 1 tablet (500 mg total) by mouth 2 (two) times daily for 7 days.   fluconazole (DIFLUCAN) 150 MG tablet 3 tablet 0    Sig: Take 1 tablet (150 mg total) by mouth every 3 (three) days for 3 doses.    Follow-up with primary provider as scheduled.   Rema Fendt, NP

## 2023-07-21 NOTE — Progress Notes (Signed)
Patient states she constantly gets either Yeast infection or Bacterial Infection.

## 2023-07-22 LAB — CERVICOVAGINAL ANCILLARY ONLY
Bacterial Vaginitis (gardnerella): POSITIVE — AB
Candida Glabrata: NEGATIVE
Candida Vaginitis: POSITIVE — AB
Chlamydia: NEGATIVE
Comment: NEGATIVE
Comment: NEGATIVE
Comment: NEGATIVE
Comment: NEGATIVE
Comment: NEGATIVE
Comment: NORMAL
Neisseria Gonorrhea: NEGATIVE
Trichomonas: NEGATIVE

## 2023-07-29 ENCOUNTER — Encounter: Payer: Self-pay | Admitting: Radiology

## 2023-07-29 ENCOUNTER — Ambulatory Visit: Payer: BC Managed Care – PPO | Admitting: Radiology

## 2023-07-29 VITALS — BP 112/76

## 2023-07-29 DIAGNOSIS — N761 Subacute and chronic vaginitis: Secondary | ICD-10-CM

## 2023-07-29 NOTE — Progress Notes (Deleted)
[name] 05-Oct-1981 [id number]   History:  42 y.o. G *** presents for annual exam.  Gynecologic History Patient's last menstrual period was 07/13/2023 (exact date).   Contraception/Family planning: {method:5051} Sexually active: *** Last Pap: ***. Results were: {norm/abn:16337} Last mammogram: ***. Results were: {norm/abn:16337}  Obstetric History OB History  Gravida Para Term Preterm AB Living  3 3 2 1   3   SAB IAB Ectopic Multiple Live Births          3    # Outcome Date GA Lbr Len/2nd Weight Sex Type Anes PTL Lv  3 Preterm 08/31/11 [redacted]w[redacted]d  4 lb 9.6 oz (2.085 kg) M CS-LTranv EPI  LIV     Birth Comments: Newborn Screening: Normal--HB FA Passed hearing screen 7 days in NICU, full course of Amp and Gen  2 Term 07/30/02    F Vag-Spont  N LIV  1 Term 04/22/97    F Vag-Spont  N LIV     {Common ambulatory SmartLinks:19316}  Review of Systems {ros; complete:30496}  ROS  Past medical history, past surgical history, family history and social history were all reviewed and documented in the EPIC chart.   Exam:  There were no vitals filed for this visit. There is no height or weight on file to calculate BMI.  General appearance:  Normal Thyroid:  Symmetrical, normal in size, without palpable masses or nodularity. Respiratory  Auscultation:  Clear without wheezing or rhonchi Cardiovascular  Auscultation:  Regular rate, without rubs, murmurs or gallops  Edema/varicosities:  Not grossly evident Abdominal  Soft,nontender, without masses, guarding or rebound.  Liver/spleen:  No organomegaly noted  Hernia:  None appreciated  Skin  Inspection:  Grossly normal Breasts: Examined lying and sitting.   Right: Without masses, retractions, nipple discharge or axillary adenopathy.   Left: Without masses, retractions, nipple discharge or axillary adenopathy. Genitourinary   Inguinal/mons:  Normal without inguinal adenopathy  External genitalia:  Normal appearing vulva  with no masses, tenderness, or lesions  BUS/Urethra/Skene's glands:  Normal without masses or exudate  Vagina:  Normal appearing with normal color and discharge, no lesions  Cervix:  Normal appearing without discharge or lesions  Uterus:  Normal in size, shape and contour.  Mobile, nontender  Adnexa/parametria:     Rt: Normal in size, without masses or tenderness.   Lt: Normal in size, without masses or tenderness.  Anus and perineum: Normal   Raynelle Fanning, CMA present for exam  Assessment/Plan:   There are no diagnoses linked to this encounter.    Discussed SBE, colonoscopy and DEXA screening as directed/appropriate. Recommend of exercise weekly, including weight bearing exercise. Encouraged the use of seatbelts and sunscreen. Return in 1 year for annual or as needed.   Arlie Solomons B WHNP-BC 2:24 PM 07/29/2023

## 2023-07-29 NOTE — Progress Notes (Signed)
      Subjective: [name] is a 42 y.o. female who complains of recurrent vaginitis. Treated last week at PCP for BV and yeast. No symptoms today. Currently taking metronidazole 500mg  (2 days left and completed diflucan #3 course).     Review of Systems  All other systems reviewed and are negative.   Past Medical History:  Diagnosis Date   Abnormal Pap smear    Anemia    Chlamydia    Gonorrhea    Headache(784.0)    Herpes simplex type 1 infection    Herpes simplex type 2 infection    HPV (human papilloma virus) infection    Trichomonas    Urinary tract infection       Objective:  Today's Vitals   07/29/23 1502  BP: 112/76   There is no height or weight on file to calculate BMI.   Physical Exam Vitals and nursing note reviewed.  Constitutional:      Appearance: Normal appearance. She is normal weight.  HENT:     Head: Normocephalic and atraumatic.  Abdominal:     General: Abdomen is flat. Bowel sounds are normal.     Palpations: Abdomen is soft.  Genitourinary:    Vagina: No bleeding or lesions.     Comments: PELVIC DEFERRED  Neurological:     Mental Status: She is alert and oriented to person, place, and time.  Psychiatric:        Mood and Affect: Mood normal.        Thought Content: Thought content normal.        Judgment: Judgment normal.     Assessment:/Plan:  1. Chronic vaginitis Discussed prevention Use condoms Partner to brush teeth and wash hands before intercourse Boric acid 600mg  weekly (2x/week after menses) Improve diet   Avoid intercourse until symptoms are resolved. Safe sex encouraged. Avoid the use of soaps or perfumed products in the peri area. Avoid tub baths and sitting in sweaty or wet clothing for prolonged periods of time.   Return if symptoms worsen or fail to improve.   [name] B, NP 3:28 PM

## 2023-07-29 NOTE — Patient Instructions (Signed)

## 2023-09-13 ENCOUNTER — Ambulatory Visit
Admission: EM | Admit: 2023-09-13 | Discharge: 2023-09-13 | Disposition: A | Discharge status: Home / Self Care | Payer: BC Managed Care – PPO | Attending: Family Medicine | Admitting: Family Medicine

## 2023-09-13 ENCOUNTER — Encounter: Payer: Self-pay | Admitting: Emergency Medicine

## 2023-09-13 DIAGNOSIS — J029 Acute pharyngitis, unspecified: Secondary | ICD-10-CM | POA: Insufficient documentation

## 2023-09-13 LAB — POCT RAPID STREP A (OFFICE): Rapid Strep A Screen: NEGATIVE

## 2023-09-13 NOTE — ED Triage Notes (Signed)
Woke up with sore throat this morning. Took theraflu for it. Reports new sweating. Denies cough, nasal congestion

## 2023-09-13 NOTE — ED Provider Notes (Addendum)
[name]    CSN: [id number] Arrival date & time: 09/13/23  1404      History   Chief Complaint Chief Complaint  Patient presents with   Sore Throat    HPI [name] is a 42 y.o. female.    Sore Throat  Patient is here for sore throat since this morning.  No runny nose, congestion.  Some chills, feels warm.  No headache.  Some nausea yesterday.  Her daughter was sick at home.           Past Medical History:  Diagnosis Date   Abnormal Pap smear    Anemia    Chlamydia    Gonorrhea    Headache(784.0)    Herpes simplex type 1 infection    Herpes simplex type 2 infection    HPV (human papilloma virus) infection    Trichomonas    Urinary tract infection     Patient Active Problem List   Diagnosis Date Noted   Radiculopathy, cervical region 11/24/2022   BV (bacterial vaginosis) 06/24/2022   Candida vaginitis 05/06/2022   HSV (herpes simplex virus) infection 03/11/2022   Lipoma of forearm 07/24/2021   Insomnia 06/11/2021   Acute right-sided low back pain with right-sided sciatica 12/04/2020   Anemia 01/29/2020    Past Surgical History:  Procedure Laterality Date   CESAREAN SECTION  08/31/2011   Procedure: CESAREAN SECTION;  Surgeon: Roseanna Rainbow, MD;  Location: [facility];  Service: Gynecology;  Laterality: N/A;  Primary Cesarean section Baby Boy @ 1418   COLPOSCOPY  2010   LEEP     LIPOMA EXCISION Right 08/08/2021   Procedure: RIGHT FOREARM MASS EXCISION;  Surgeon: Marlyne Beards, MD;  [facility];  Service: Orthopedics;  Laterality: Right;    OB History     Gravida  3   Para  3   Term  2   Preterm  1   AB      Living  3      SAB      IAB      Ectopic      Multiple      Live Births  3            Home Medications    Prior to Admission medications   Medication Sig Start Date End Date Taking? Authorizing Provider  fluconazole (DIFLUCAN) 150 MG tablet Take 150 mg by  mouth every 3 (three) days. 07/22/23   [provider]  metroNIDAZOLE (FLAGYL) 500 MG/100ML Inject 500 mg into the vein every 12 (twelve) hours.    [provider]    Family History Family History  Problem Relation Age of Onset   Hypertension Mother    Diabetes Mother    Stroke Mother    Heart disease Brother    Heart attack Brother 50    Social History Social History   Tobacco Use   Smoking status: Former    Current packs/day: 0.25    Average packs/day: 0.3 packs/day for 5.0 years (1.3 ttl pk-yrs)    Types: Cigarettes    Passive exposure: Past   Smokeless tobacco: Never   Tobacco comments:    quit with + preg  Vaping Use   Vaping status: Never Used  Substance Use Topics   Alcohol use: Yes    Alcohol/week: 1.0 standard drink of alcohol    Types: 1 Standard drinks or equivalent per week    Comment: rare   Drug use: No  Allergies   Patient has no known allergies.   Review of Systems Review of Systems  Constitutional:  Positive for chills and fatigue.  HENT:  Positive for sore throat. Negative for congestion and rhinorrhea.   Respiratory: Negative.    Cardiovascular: Negative.   Gastrointestinal:  Positive for nausea.  Genitourinary: Negative.   Musculoskeletal: Negative.   Neurological: Negative.   Psychiatric/Behavioral: Negative.       Physical Exam Triage Vital Signs ED Triage Vitals  Encounter Vitals Group     BP 09/13/23 1413 113/78     Systolic BP Percentile --      Diastolic BP Percentile --      Pulse Rate 09/13/23 1413 98     Resp 09/13/23 1413 16     Temp 09/13/23 1413 98.8 F (37.1 C)     Temp Source 09/13/23 1413 Oral     SpO2 09/13/23 1413 95 %     Weight --      Height --      Head Circumference --      Peak Flow --      Pain Score 09/13/23 1414 8     Pain Loc --      Pain Education --      Exclude from Growth Chart --    No data found.  Updated Vital Signs BP 113/78 (BP Location: Left Arm)   Pulse 98    Temp 98.8 F (37.1 C) (Oral)   Resp 16   SpO2 95%   Visual Acuity Right Eye Distance:   Left Eye Distance:   Bilateral Distance:    Right Eye Near:   Left Eye Near:    Bilateral Near:     Physical Exam Constitutional:      General: She is not in acute distress.    Appearance: She is well-developed. She is not ill-appearing.  HENT:     Nose: No congestion or rhinorrhea.     Mouth/Throat:     Mouth: Mucous membranes are moist.     Pharynx: Posterior oropharyngeal erythema present. No pharyngeal swelling or oropharyngeal exudate.     Tonsils: No tonsillar exudate. 0 on the right. 0 on the left.  Cardiovascular:     Rate and Rhythm: Normal rate and regular rhythm.     Heart sounds: Normal heart sounds.  Pulmonary:     Effort: Pulmonary effort is normal.  Musculoskeletal:     Cervical back: Normal range of motion and neck supple.  Lymphadenopathy:     Cervical: No cervical adenopathy.  Skin:    General: Skin is warm.  Neurological:     General: No focal deficit present.     Mental Status: She is alert.  Psychiatric:        Mood and Affect: Mood normal.      UC Treatments / Results  Labs (all labs ordered are listed, but only abnormal results are displayed) Labs Reviewed  POCT RAPID STREP A (OFFICE) - Normal    EKG   Radiology No results found.  Procedures Procedures (including critical care time)  Medications Ordered in UC Medications - No data to display  Initial Impression / Assessment and Plan / UC Course  I have reviewed the triage vital signs and the nursing notes.  Pertinent labs & imaging results that were available during my care of the patient were reviewed by me and considered in my medical decision making (see chart for details).    Final Clinical Impressions(s) / UC Diagnoses  Final diagnoses:  Sore throat     Discharge Instructions      You were seen today for a sore throat.  Your rapid strep was negative, but will be sent for  culture.  If positive we will call to notify you for treatment.  In the mean time I recommend tylenol/motrin and salt water gargles for sore throat.  Please return if you are not improving or worsening by the end of the week.     ED Prescriptions   None    PDMP not reviewed this encounter.   Jannifer Franklin, MD 09/13/23 1432    Jannifer Franklin, MD 09/13/23 [phone]

## 2023-09-13 NOTE — Discharge Instructions (Signed)
You were seen today for a sore throat.  Your rapid strep was negative, but will be sent for culture.  If positive we will call to notify you for treatment.  In the mean time I recommend tylenol/motrin and salt water gargles for sore throat.  Please return if you are not improving or worsening by the end of the week.

## 2023-09-15 LAB — CULTURE, GROUP A STREP (THRC)

## 2023-11-07 ENCOUNTER — Ambulatory Visit
Admission: EM | Admit: 2023-11-07 | Discharge: 2023-11-07 | Disposition: A | Discharge status: Home / Self Care | Payer: BC Managed Care – PPO | Attending: Physician Assistant | Admitting: Physician Assistant

## 2023-11-07 DIAGNOSIS — N3000 Acute cystitis without hematuria: Secondary | ICD-10-CM | POA: Insufficient documentation

## 2023-11-07 LAB — POCT URINALYSIS DIP (MANUAL ENTRY)
Bilirubin, UA: NEGATIVE
Glucose, UA: NEGATIVE mg/dL
Ketones, POC UA: NEGATIVE mg/dL
Nitrite, UA: POSITIVE — AB
Protein Ur, POC: 100 mg/dL — AB
Spec Grav, UA: 1.02
Urobilinogen, UA: 2 U/dL — AB
pH, UA: 6.5

## 2023-11-07 MED ORDER — NITROFURANTOIN MONOHYD MACRO 100 MG PO CAPS: 100.0000 mg | ORAL_CAPSULE | Freq: Two times a day (BID) | ORAL | 0 refills | Status: AC

## 2023-11-07 NOTE — ED Triage Notes (Signed)
 Patient presents with frequent urination, urge to urinate, low back pain, low abdomen pain. Symptoms started on Friday. Treated with Azo.

## 2023-11-07 NOTE — ED Provider Notes (Signed)
 [name]    CSN: [id number] Arrival date & time: 11/07/23  9166      History   Chief Complaint No chief complaint on file.   HPI [name] is a 43 y.o. female.   Pt complains of increased urinary frequency that started about three days ago.  She reports she feels like she cannot empty or bladder completely.  She denies fever, chills, hematuria.  Complains of lower abdominal discomfort and nausea.  Has tried Azo with no relief.     Past Medical History:  Diagnosis Date   Abnormal Pap smear    Anemia    Chlamydia    Gonorrhea    Headache(784.0)    Herpes simplex type 1 infection    Herpes simplex type 2 infection    HPV (human papilloma virus) infection    Trichomonas    Urinary tract infection     Patient Active Problem List   Diagnosis Date Noted   Radiculopathy, cervical region 11/24/2022   BV (bacterial vaginosis) 06/24/2022   Candida vaginitis 05/06/2022   HSV (herpes simplex virus) infection 03/11/2022   Lipoma of forearm 07/24/2021   Insomnia 06/11/2021   Acute right-sided low back pain with right-sided sciatica 12/04/2020   Anemia 01/29/2020    Past Surgical History:  Procedure Laterality Date   CESAREAN SECTION  08/31/2011   Procedure: CESAREAN SECTION;  Surgeon: Olam DELENA Mill, MD;  Location: [facility];  Service: Gynecology;  Laterality: N/A;  Primary Cesarean section Baby Boy @ 1418   COLPOSCOPY  2010   LEEP     LIPOMA EXCISION Right 08/08/2021   Procedure: RIGHT FOREARM MASS EXCISION;  Surgeon: Romona Harari, MD;  Location: [facility];  Service: Orthopedics;  Laterality: Right;    OB History     Gravida  3   Para  3   Term  2   Preterm  1   AB      Living  3      SAB      IAB      Ectopic      Multiple      Live Births  3            Home Medications    Prior to Admission medications   Medication Sig Start Date End Date Taking? Authorizing Provider  nitrofurantoin ,  macrocrystal-monohydrate, (MACROBID ) 100 MG capsule Take 1 capsule (100 mg total) by mouth 2 (two) times daily for 5 days. 11/07/23 11/12/23 Yes Ward, Harlene PEDLAR, PA-C    Family History Family History  Problem Relation Age of Onset   Hypertension Mother    Diabetes Mother    Stroke Mother    Heart disease Brother    Heart attack Brother 72    Social History Social History   Tobacco Use   Smoking status: Former    Current packs/day: 0.25    Average packs/day: 0.3 packs/day for 5.0 years (1.3 ttl pk-yrs)    Types: Cigarettes    Passive exposure: Past   Smokeless tobacco: Never   Tobacco comments:    quit with + preg  Vaping Use   Vaping status: Never Used  Substance Use Topics   Alcohol use: Yes    Alcohol/week: 1.0 standard drink of alcohol    Types: 1 Standard drinks or equivalent per week    Comment: rare   Drug use: No     Allergies   Patient has no known allergies.   Review of Systems  Review of Systems  Constitutional:  Negative for chills and fever.  HENT:  Negative for ear pain and sore throat.   Eyes:  Negative for pain and visual disturbance.  Respiratory:  Negative for cough and shortness of breath.   Cardiovascular:  Negative for chest pain and palpitations.  Gastrointestinal:  Negative for abdominal pain and vomiting.  Genitourinary:  Positive for frequency. Negative for dysuria and hematuria.  Musculoskeletal:  Negative for arthralgias and back pain.  Skin:  Negative for color change and rash.  Neurological:  Negative for seizures and syncope.  All other systems reviewed and are negative.    Physical Exam Triage Vital Signs ED Triage Vitals  Encounter Vitals Group     BP 11/07/23 0900 94/67     Systolic BP Percentile --      Diastolic BP Percentile --      Pulse Rate 11/07/23 0858 95     Resp 11/07/23 0858 16     Temp 11/07/23 0858 98.5 F (36.9 C)     Temp Source 11/07/23 0858 Oral     SpO2 11/07/23 0902 98 %     Weight 11/07/23 0900 194  lb (88 kg)     Height 11/07/23 0900 5' 8 (1.727 m)     Head Circumference --      Peak Flow --      Pain Score 11/07/23 0900 8     Pain Loc --      Pain Education --      Exclude from Growth Chart --    No data found.  Updated Vital Signs BP 94/67 (BP Location: Left Arm)   Pulse 95   Temp 98.5 F (36.9 C) (Oral)   Resp 16   Ht 5' 8 (1.727 m)   Wt 194 lb (88 kg)   LMP 10/22/2023 (Exact Date)   SpO2 98%   BMI 29.50 kg/m   Visual Acuity Right Eye Distance:   Left Eye Distance:   Bilateral Distance:    Right Eye Near:   Left Eye Near:    Bilateral Near:     Physical Exam Vitals and nursing note reviewed.  Constitutional:      General: She is not in acute distress.    Appearance: She is well-developed.  HENT:     Head: Normocephalic and atraumatic.  Eyes:     Conjunctiva/sclera: Conjunctivae normal.  Cardiovascular:     Rate and Rhythm: Normal rate and regular rhythm.     Heart sounds: No murmur heard. Pulmonary:     Effort: Pulmonary effort is normal. No respiratory distress.     Breath sounds: Normal breath sounds.  Abdominal:     Palpations: Abdomen is soft.     Tenderness: There is no abdominal tenderness.  Musculoskeletal:        General: No swelling.     Cervical back: Neck supple.  Skin:    General: Skin is warm and dry.     Capillary Refill: Capillary refill takes less than 2 seconds.  Neurological:     Mental Status: She is alert.  Psychiatric:        Mood and Affect: Mood normal.      UC Treatments / Results  Labs (all labs ordered are listed, but only abnormal results are displayed) Labs Reviewed  POCT URINALYSIS DIP (MANUAL ENTRY) - Abnormal; Notable for the following components:      Result Value   Clarity, UA cloudy (*)    Blood, UA moderate (*)  Protein Ur, POC =100 (*)    Urobilinogen, UA 2.0 (*)    Nitrite, UA Positive (*)    Leukocytes, UA Moderate (2+) (*)    All other components within normal limits  URINE CULTURE     EKG   Radiology No results found.  Procedures Procedures (including critical care time)  Medications Ordered in UC Medications - No data to display  Initial Impression / Assessment and Plan / UC Course  I have reviewed the triage vital signs and the nursing notes.  Pertinent labs & imaging results that were available during my care of the patient were reviewed by me and considered in my medical decision making (see chart for details).     Will treat for UTI based on results.  Antibiotic prescribed.  Urine culture sent, will change treatment plan if indicated.  Supportive care discussed.  Return precaution discussed.  Final Clinical Impressions(s) / UC Diagnoses   Final diagnoses:  Acute cystitis without hematuria     Discharge Instructions      Take antibiotic as prescribed Will call with urine culture results if needed Drink plenty of fluids Return if no improvement or symptoms become worse.      ED Prescriptions     Medication Sig Dispense Auth. Provider   nitrofurantoin , macrocrystal-monohydrate, (MACROBID ) 100 MG capsule Take 1 capsule (100 mg total) by mouth 2 (two) times daily for 5 days. 10 capsule Ward, Garhett Bernhard Z, PA-C      PDMP not reviewed this encounter.   Ward, Harlene PEDLAR, PA-C 11/07/23 0930

## 2023-11-07 NOTE — Discharge Instructions (Addendum)
 Take antibiotic as prescribed Will call with urine culture results if needed Drink plenty of fluids Return if no improvement or symptoms become worse.

## 2023-11-09 LAB — URINE CULTURE: Culture: >=100000 — AB

## 2023-11-18 ENCOUNTER — Telehealth: Payer: BC Managed Care – PPO | Admitting: Family Medicine

## 2023-11-18 DIAGNOSIS — R29898 Other symptoms and signs involving the musculoskeletal system: Secondary | ICD-10-CM

## 2023-11-18 DIAGNOSIS — M5441 Lumbago with sciatica, right side: Secondary | ICD-10-CM

## 2023-11-18 NOTE — Progress Notes (Signed)
Based on what you shared with me, I feel your condition warrants further evaluation as soon as possible at an Emergency department.    NOTE: There will be NO CHARGE for this eVisit   If you are having a true medical emergency please call 911.

## 2023-11-19 ENCOUNTER — Ambulatory Visit
Admission: RE | Admit: 2023-11-19 | Discharge: 2023-11-19 | Disposition: A | Discharge status: Home / Self Care | Payer: BC Managed Care – PPO | Source: Ambulatory Visit | Attending: Family | Admitting: Family

## 2023-11-19 VITALS — BP 107/72 | HR 69 | Temp 98.1°F | Resp 19

## 2023-11-19 DIAGNOSIS — M79605 Pain in left leg: Secondary | ICD-10-CM | POA: Diagnosis not present

## 2023-11-19 DIAGNOSIS — M79604 Pain in right leg: Secondary | ICD-10-CM

## 2023-11-19 DIAGNOSIS — M62838 Other muscle spasm: Secondary | ICD-10-CM

## 2023-11-19 MED ORDER — CYCLOBENZAPRINE HCL 10 MG PO TABS: ORAL_TABLET | ORAL | 0 refills | Status: DC

## 2023-11-19 MED ORDER — PREDNISONE 10 MG (21) PO TBPK: ORAL_TABLET | Freq: Every day | ORAL | 0 refills | Status: DC

## 2023-11-19 NOTE — Discharge Instructions (Signed)
Recommend start Prednisone 10mg  tablets- take 6 tablets today then decrease by 1 tablet each day until you are finished on day 6- take with food. May use Flexeril 10mg  tablets- take 1/2 to 1 whole tablet every 8 hours as needed for muscle spasms/pain- can cause drowsiness. Apply warm moist heat to area as needed. If pain gets worse or numbness gets worse, go to the ER ASAP. Otherwise, follow-up with your PCP in 3 days if not improving.

## 2023-11-19 NOTE — ED Provider Notes (Signed)
[name]    CSN: [id number] Arrival date & time: 11/19/23  1043      History   Chief Complaint Chief Complaint  Patient presents with   Leg Pain    Having problems with my legs - Entered by patient    HPI TAGEN BRETHAUER is a 43 y.o. female.   43 year old female presents with bilateral leg pain that started 2 days ago at work. She stands on a concrete floor and started having right leg pain from her upper thigh traveling down to her lower leg. Then started having left leg pain in similar pattern.   The history is provided by the patient.    Past Medical History:  Diagnosis Date   Abnormal Pap smear    Anemia    Chlamydia    Gonorrhea    Headache(784.0)    Herpes simplex type 1 infection    Herpes simplex type 2 infection    HPV (human papilloma virus) infection    Trichomonas    Urinary tract infection     Patient Active Problem List   Diagnosis Date Noted   Radiculopathy, cervical region 11/24/2022   BV (bacterial vaginosis) 06/24/2022   Candida vaginitis 05/06/2022   HSV (herpes simplex virus) infection 03/11/2022   Lipoma of forearm 07/24/2021   Insomnia 06/11/2021   Acute right-sided low back pain with right-sided sciatica 12/04/2020   Anemia 01/29/2020    Past Surgical History:  Procedure Laterality Date   CESAREAN SECTION  08/31/2011   Procedure: CESAREAN SECTION;  Surgeon: Roseanna Rainbow, MD;  Location: [facility];  Service: Gynecology;  Laterality: N/A;  Primary Cesarean section Baby Boy @ 1418   COLPOSCOPY  2010   LEEP     LIPOMA EXCISION Right 08/08/2021   Procedure: RIGHT FOREARM MASS EXCISION;  Surgeon: Marlyne Beards, MD;  Location: [facility];  Service: Orthopedics;  Laterality: Right;    OB History     Gravida  3   Para  3   Term  2   Preterm  1   AB      Living  3      SAB      IAB      Ectopic      Multiple      Live Births  3            Home Medications    Prior to  Admission medications   Medication Sig Start Date End Date Taking? Authorizing Provider  cyclobenzaprine (FLEXERIL) 10 MG tablet Take 1/2 to 1 whole tablet by mouth every 8 hours as needed for muscle spasms/pain. 11/19/23  Yes Tenzin Edelman, Ali Lowe, NP  predniSONE (STERAPRED UNI-PAK 21 TAB) 10 MG (21) TBPK tablet Take by mouth daily. Take 6 tabs by mouth on the first day then decrease by 1 tablet each day until finished on day 6. 11/19/23  Yes Alvilda Mckenna, Ali Lowe, NP    Family History Family History  Problem Relation Age of Onset   Hypertension Mother    Diabetes Mother    Stroke Mother    Heart disease Brother    Heart attack Brother 19    Social History Social History   Tobacco Use   Smoking status: Former    Current packs/day: 0.25    Average packs/day: 0.3 packs/day for 5.0 years (1.3 ttl pk-yrs)    Types: Cigarettes    Passive exposure: Past   Smokeless tobacco: Never   Tobacco comments:  quit with + preg  Vaping Use   Vaping status: Never Used  Substance Use Topics   Alcohol use: Yes    Alcohol/week: 1.0 standard drink of alcohol    Types: 1 Standard drinks or equivalent per week    Comment: rare   Drug use: No     Allergies   Patient has no known allergies.   Review of Systems Review of Systems   Physical Exam Triage Vital Signs ED Triage Vitals  Encounter Vitals Group     BP 11/19/23 1145 107/72     Systolic BP Percentile --      Diastolic BP Percentile --      Pulse Rate 11/19/23 1145 69     Resp 11/19/23 1145 19     Temp 11/19/23 1145 98.1 F (36.7 C)     Temp Source 11/19/23 1145 Oral     SpO2 11/19/23 1145 98 %     Weight --      Height --      Head Circumference --      Peak Flow --      Pain Score 11/19/23 1144 6     Pain Loc --      Pain Education --      Exclude from Growth Chart --    No data found.  Updated Vital Signs BP 107/72 (BP Location: Right Arm)   Pulse 69   Temp 98.1 F (36.7 C) (Oral)   Resp 19   LMP 11/15/2023 (Exact  Date)   SpO2 98%   Visual Acuity Right Eye Distance:   Left Eye Distance:   Bilateral Distance:    Right Eye Near:   Left Eye Near:    Bilateral Near:     Physical Exam Musculoskeletal:     Comments: No redness, swelling or rash on legs.       UC Treatments / Results  Labs (all labs ordered are listed, but only abnormal results are displayed) Labs Reviewed - No data to display  EKG   Radiology No results found.  Procedures Procedures (including critical care time)  Medications Ordered in UC Medications - No data to display  Initial Impression / Assessment and Plan / UC Course  I have reviewed the triage vital signs and the nursing notes.  Pertinent labs & imaging results that were available during my care of the patient were reviewed by me and considered in my medical decision making (see chart for details).     *** Final Clinical Impressions(s) / UC Diagnoses   Final diagnoses:  Acute leg pain, right  Acute leg pain, left  Muscle spasms of both lower extremities     Discharge Instructions      Recommend start Prednisone 10mg  tablets- take 6 tablets today then decrease by 1 tablet each day until you are finished on day 6- take with food. May use Flexeril 10mg  tablets- take 1/2 to 1 whole tablet every 8 hours as needed for muscle spasms/pain- can cause drowsiness. Apply warm moist heat to area as needed. If pain gets worse or numbness gets worse, go to the ER ASAP. Otherwise, follow-up with your PCP in 3 days if not improving.     ED Prescriptions     Medication Sig Dispense Auth. Provider   predniSONE (STERAPRED UNI-PAK 21 TAB) 10 MG (21) TBPK tablet Take by mouth daily. Take 6 tabs by mouth on the first day then decrease by 1 tablet each day until finished on day 6. 21  tablet Sudie Grumbling, NP   cyclobenzaprine (FLEXERIL) 10 MG tablet Take 1/2 to 1 whole tablet by mouth every 8 hours as needed for muscle spasms/pain. 15 tablet Honor Fairbank, Ali Lowe, NP       PDMP not reviewed this encounter.

## 2023-11-19 NOTE — ED Triage Notes (Signed)
Patient states that she was at work Wednesday and started having bilateral leg pain. More in the right.

## 2024-01-21 ENCOUNTER — Ambulatory Visit
Admission: RE | Admit: 2024-01-21 | Discharge: 2024-01-21 | Disposition: A | Discharge status: Home / Self Care | Payer: Self-pay | Source: Ambulatory Visit | Attending: Family Medicine | Admitting: Family Medicine

## 2024-01-21 VITALS — BP 95/59 | HR 82 | Temp 98.1°F | Resp 18 | Ht 68.0 in | Wt 180.0 lb

## 2024-01-21 DIAGNOSIS — K529 Noninfective gastroenteritis and colitis, unspecified: Secondary | ICD-10-CM

## 2024-01-21 MED ORDER — DIPHENOXYLATE-ATROPINE 2.5-0.025 MG PO TABS: 1.0000 | ORAL_TABLET | Freq: Four times a day (QID) | ORAL | 0 refills | Status: DC | PRN

## 2024-01-21 MED ORDER — ONDANSETRON 4 MG PO TBDP: 4.0000 mg | ORAL_TABLET | Freq: Three times a day (TID) | ORAL | 0 refills | Status: DC | PRN

## 2024-01-21 MED ORDER — ONDANSETRON 4 MG PO TBDP
4.0000 mg | ORAL_TABLET | Freq: Once | ORAL | Status: AC
  Administered 2024-01-21: 4 mg via ORAL

## 2024-01-21 NOTE — Discharge Instructions (Signed)
 Ondansetron dissolved in the mouth every 8 hours as needed for nausea or vomiting. Clear liquids(water, gatorade/pedialyte, ginger ale/sprite, chicken broth/soup) and bland things(crackers/toast, rice, potato, bananas) to eat. Avoid acidic foods like lemon/lime/orange/tomato, and avoid greasy/spicy foods.  We have given you 1 dose of this medication here in the office  Diphenoxylate-atropine-take 1 tablet 4 times a day as needed for diarrhea  If your symptoms are not relieved by these treatments, or if you worsen anyway, please go to the emergency room

## 2024-01-21 NOTE — ED Triage Notes (Signed)
 I haven't been feeling good for the last two days. - Entered by patient  "This started with vomiting (yesterday) and some loose stool (used my son's Zofran and it worked well)". "I think it is the food truck I ate from recently". Last emesis "4am" but loose stools still remain with nausea.

## 2024-01-21 NOTE — ED Provider Notes (Signed)
 [name]    CSN: [id number] Arrival date & time: 01/21/24  0905      History   Chief Complaint Chief Complaint  Patient presents with   Diarrhea    HPI [name] is a 43 y.o. female.    Diarrhea Here for diarrhea and vomiting.  Yesterday the diarrhea was very frequent and she had diarrhea about 20 times.  She is still having diarrhea this morning and is having some upper abdominal cramping.  She has vomited numerous times overnight, but the last time she vomited was 4 in the morning when she took one of her sons Zofran tablets.  She is still nauseated.   NKDA Last menstrual cycle March 14    Past Medical History:  Diagnosis Date   Abnormal Pap smear    Anemia    BV (bacterial vaginosis) 06/24/2022   Candida vaginitis 05/06/2022   Chlamydia    Gonorrhea    Headache(784.0)    Herpes simplex type 1 infection    Herpes simplex type 2 infection    HPV (human papilloma virus) infection    HSV (herpes simplex virus) infection 03/11/2022   Trichomonas    Urinary tract infection     Patient Active Problem List   Diagnosis Date Noted   Radiculopathy, cervical region 11/24/2022   Lipoma of forearm 07/24/2021   Insomnia 06/11/2021   Acute right-sided low back pain with right-sided sciatica 12/04/2020   Anemia 01/29/2020    Past Surgical History:  Procedure Laterality Date   CESAREAN SECTION  08/31/2011   Procedure: CESAREAN SECTION;  Surgeon: Roseanna Rainbow, MD;  Location: [facility];  Service: Gynecology;  Laterality: N/A;  Primary Cesarean section Baby Boy @ 1418   COLPOSCOPY  2010   LEEP     LIPOMA EXCISION Right 08/08/2021   Procedure: RIGHT FOREARM MASS EXCISION;  Surgeon: Marlyne Beards, MD;  Location: [facility];  Service: Orthopedics;  Laterality: Right;    OB History     Gravida  3   Para  3   Term  2   Preterm  1   AB      Living  3      SAB      IAB      Ectopic      Multiple       Live Births  3            Home Medications    Prior to Admission medications   Medication Sig Start Date End Date Taking? Authorizing Provider  diphenoxylate-atropine (LOMOTIL) 2.5-0.025 MG tablet Take 1 tablet by mouth 4 (four) times daily as needed for diarrhea or loose stools. 01/21/24  Yes Zenia Resides, MD  ondansetron (ZOFRAN-ODT) 4 MG disintegrating tablet Take 1 tablet (4 mg total) by mouth every 8 (eight) hours as needed for nausea or vomiting. 01/21/24  Yes Alexandros Ewan, Janace Aris, MD    Family History Family History  Problem Relation Age of Onset   Hypertension Mother    Diabetes Mother    Stroke Mother    Heart disease Brother    Heart attack Brother 82    Social History Social History   Tobacco Use   Smoking status: Former    Current packs/day: 0.25    Average packs/day: 0.3 packs/day for 5.0 years (1.3 ttl pk-yrs)    Types: Cigarettes    Passive exposure: Past   Smokeless tobacco: Never   Tobacco comments:    quit with +  preg  Vaping Use   Vaping status: Never Used  Substance Use Topics   Alcohol use: Not Currently    Alcohol/week: 1.0 standard drink of alcohol    Types: 1 Standard drinks or equivalent per week    Comment: rare   Drug use: No     Allergies   Patient has no known allergies.   Review of Systems Review of Systems  Gastrointestinal:  Positive for diarrhea.     Physical Exam Triage Vital Signs ED Triage Vitals  Encounter Vitals Group     BP 01/21/24 0938 (!) 95/59     Systolic BP Percentile --      Diastolic BP Percentile --      Pulse Rate 01/21/24 0938 82     Resp 01/21/24 0938 18     Temp 01/21/24 0938 98.1 F (36.7 C)     Temp Source 01/21/24 0938 Oral     SpO2 01/21/24 0938 96 %     Weight 01/21/24 0936 180 lb (81.6 kg)     Height 01/21/24 0936 5\' 8"  (1.727 m)     Head Circumference --      Peak Flow --      Pain Score 01/21/24 0936 0     Pain Loc --      Pain Education --      Exclude from Growth Chart --     No data found.  Updated Vital Signs BP (!) 95/59 (BP Location: Left Arm)   Pulse 82   Temp 98.1 F (36.7 C) (Oral)   Resp 18   Ht 5\' 8"  (1.727 m)   Wt 81.6 kg   LMP 01/07/2024 (Exact Date)   SpO2 96%   BMI 27.37 kg/m   Visual Acuity Right Eye Distance:   Left Eye Distance:   Bilateral Distance:    Right Eye Near:   Left Eye Near:    Bilateral Near:     Physical Exam Vitals reviewed.  Constitutional:      General: She is not in acute distress.    Appearance: She is not ill-appearing, toxic-appearing or diaphoretic.  HENT:     Nose: Nose normal.     Mouth/Throat:     Mouth: Mucous membranes are moist.     Pharynx: No oropharyngeal exudate or posterior oropharyngeal erythema.  Eyes:     Extraocular Movements: Extraocular movements intact.     Conjunctiva/sclera: Conjunctivae normal.     Pupils: Pupils are equal, round, and reactive to light.  Cardiovascular:     Rate and Rhythm: Normal rate and regular rhythm.     Heart sounds: No murmur heard. Pulmonary:     Effort: Pulmonary effort is normal. No respiratory distress.     Breath sounds: No stridor. No wheezing, rhonchi or rales.  Abdominal:     General: There is no distension.     Palpations: Abdomen is soft. There is no mass.     Tenderness: There is no guarding.     Comments: Bowel sounds are normal.  She is tender in her epigastrium.   Musculoskeletal:     Cervical back: Neck supple.  Lymphadenopathy:     Cervical: No cervical adenopathy.  Skin:    Capillary Refill: Capillary refill takes less than 2 seconds.     Coloration: Skin is not jaundiced or pale.  Neurological:     General: No focal deficit present.     Mental Status: She is alert and oriented to person, place, and time.  Psychiatric:        Behavior: Behavior normal.      UC Treatments / Results  Labs (all labs ordered are listed, but only abnormal results are displayed) Labs Reviewed - No data to display  EKG   Radiology No  results found.  Procedures Procedures (including critical care time)  Medications Ordered in UC Medications  ondansetron (ZOFRAN-ODT) disintegrating tablet 4 mg (4 mg Oral Given 01/21/24 0940)    Initial Impression / Assessment and Plan / UC Course  I have reviewed the triage vital signs and the nursing notes.  Pertinent labs & imaging results that were available during my care of the patient were reviewed by me and considered in my medical decision making (see chart for details).     Zofran is sent in for the nausea and Lomotil is sent in for diarrhea, which I think will help her cramping also.  She will go to the emergency room if she is not held by these medications or if she worsens in any way. Final Clinical Impressions(s) / UC Diagnoses   Final diagnoses:  Gastroenteritis     Discharge Instructions      Ondansetron dissolved in the mouth every 8 hours as needed for nausea or vomiting. Clear liquids(water, gatorade/pedialyte, ginger ale/sprite, chicken broth/soup) and bland things(crackers/toast, rice, potato, bananas) to eat. Avoid acidic foods like lemon/lime/orange/tomato, and avoid greasy/spicy foods.  We have given you 1 dose of this medication here in the office  Diphenoxylate-atropine-take 1 tablet 4 times a day as needed for diarrhea  If your symptoms are not relieved by these treatments, or if you worsen anyway, please go to the emergency room     ED Prescriptions     Medication Sig Dispense Auth. Provider   ondansetron (ZOFRAN-ODT) 4 MG disintegrating tablet Take 1 tablet (4 mg total) by mouth every 8 (eight) hours as needed for nausea or vomiting. 10 tablet Zenia Resides, MD   diphenoxylate-atropine (LOMOTIL) 2.5-0.025 MG tablet Take 1 tablet by mouth 4 (four) times daily as needed for diarrhea or loose stools. 16 tablet Shaunessy Dobratz, Janace Aris, MD      I have reviewed the PDMP during this encounter.   Zenia Resides, MD 01/21/24 1001

## 2024-05-02 ENCOUNTER — Ambulatory Visit: Payer: Self-pay

## 2024-05-02 ENCOUNTER — Telehealth: Admitting: Physician Assistant

## 2024-05-02 DIAGNOSIS — F439 Reaction to severe stress, unspecified: Secondary | ICD-10-CM | POA: Diagnosis not present

## 2024-05-02 MED ORDER — HYDROXYZINE PAMOATE 25 MG PO CAPS: 25.0000 mg | ORAL_CAPSULE | Freq: Every evening | ORAL | 0 refills | Status: AC | PRN

## 2024-05-02 NOTE — Telephone Encounter (Signed)
 FYI Only or Action Required?: FYI only for provider.  Patient was last seen in primary care on 07/21/2023 by Lorren Greig PARAS, NP.  Called Nurse Triage reporting Anxiety.  Symptoms began 12/08/2023.  Interventions attempted: Nothing.  Symptoms are: gradually worsening.  Triage Disposition: See PCP When Office is Open (Within 3 Days)  Patient/caregiver understands and will follow disposition?: Yes    Patient not able to do in office appointment this week due to staffing at work: gave patient virtual urgent care appt for today.  Summary: behavioral health / rx req   The patient would like to speak with a member of staff when possible. The patient lost their former partner on 12/08/23 and would like to speak with a member of clinical staff about their ongoing anxiety and stress as well as a potential prescription to help with their concerns     Reason for Disposition  MODERATE anxiety (e.g., persistent or frequent anxiety symptoms; interferes with sleep, school, or work)  Answer Assessment - Initial Assessment Questions 1. CONCERN: Did anything happen that prompted you to call today? Lost partner 12/08/2023 2. ANXIETY SYMPTOMS: Can you describe how you (your loved one; patient) have been feeling? (e.g., tense, restless, panicky, anxious, keyed up, overwhelmed, sense of impending doom).       tense, restless, panicky, anxious, keyed up, overwhelmed, sense of impending doom).  3. ONSET: How long have you been feeling this way? (e.g., hours, days, weeks)     12/08/2023 4. SEVERITY: How would you rate the level of anxiety? (e.g., 0 - 10; or mild, moderate, severe).     Moderate to severe 5. FUNCTIONAL IMPAIRMENT: How have these feelings affected your ability to do daily activities? Have you had more difficulty than usual doing your normal daily activities? (e.g., getting better, same, worse; self-care, school, work, interactions)     Not able to function  6. HISTORY: Have you  felt this way before? Have you ever been diagnosed with an anxiety problem in the past? (e.g., generalized anxiety disorder, panic attacks, PTSD). If Yes, ask: How was this problem treated? (e.g., medicines, counseling, etc.)     yes 7. RISK OF HARM - SUICIDAL IDEATION: Do you ever have thoughts of hurting or killing yourself? If Yes, ask:  Do you have these feelings now? Do you have a plan on how you would do this?     no 8. TREATMENT:  What has been done so far to treat this anxiety? (e.g., medicines, relaxation strategies). What has helped?     nna 9. TREATMENT - THERAPIST: Do you have a counselor or therapist? Name?     na 10. POTENTIAL TRIGGERS: Do you drink caffeinated beverages (e.g., coffee, colas, teas), and how much daily? Do you drink alcohol or use any drugs? Have you started any new medicines recently?       na 11. PATIENT SUPPORT: Who is with you now? Who do you live with? Do you have family or friends who you can talk to?        na 12. OTHER SYMPTOMS: Do you have any other symptoms? (e.g., feeling depressed, trouble concentrating, trouble sleeping, trouble breathing, palpitations or fast heartbeat, chest pain, sweating, nausea, or diarrhea)       Trouble concentrating, trouble sleeping, sweating, increase HR 13. PREGNANCY: Is there any chance you are pregnant? When was your last menstrual period?       no  Protocols used: Anxiety and Panic Attack-A-AH

## 2024-05-02 NOTE — Progress Notes (Signed)
 Virtual Visit Consent   [name], you are scheduled for a virtual visit with a [facility] provider today. Just as with appointments in the office, your consent must be obtained to participate. Your consent will be active for this visit and any virtual visit you may have with one of our providers in the next 365 days. If you have a MyChart account, a copy of this consent can be sent to you electronically.  As this is a virtual visit, video technology does not allow for your provider to perform a traditional examination. This may limit your provider's ability to fully assess your condition. If your provider identifies any concerns that need to be evaluated in person or the need to arrange testing (such as labs, EKG, etc.), we will make arrangements to do so. Although advances in technology are sophisticated, we cannot ensure that it will always work on either your end or our end. If the connection with a video visit is poor, the visit may have to be switched to a telephone visit. With either a video or telephone visit, we are not always able to ensure that we have a secure connection.  By engaging in this virtual visit, you consent to the provision of healthcare and authorize for your insurance to be billed (if applicable) for the services provided during this visit. Depending on your insurance coverage, you may receive a charge related to this service.  I need to obtain your verbal consent now. Are you willing to proceed with your visit today? [name] has provided verbal consent on 05/02/2024 for a virtual visit (video or telephone). Teena Shuck, [place]  Date: 05/02/2024 4:45 PM   Virtual Visit via Video Note   I, Teena Shuck, connected with  [name]  ([id number], 12-28-1980) on 05/02/24 at  4:45 PM EDT by a video-enabled telemedicine application and verified that I am speaking with the correct person using two identifiers.  Location: Patient: Virtual Visit Location  Patient: Home Provider: Virtual Visit Location Provider: Home Office   I discussed the limitations of evaluation and management by telemedicine and the availability of in person appointments. The patient expressed understanding and agreed to proceed.    History of Present Illness: [name] is a 43 y.o. who identifies as a female who was assigned female at birth, and is being seen today for anxiety medication.[name]  HPI: Anxiety Presents for follow-up visit. Symptoms include excessive worry, irritability and palpitations. Symptoms occur constantly. The quality of sleep is fair. Nighttime awakenings: none.      Problems:  Patient Active Problem List   Diagnosis Date Noted   Radiculopathy, cervical region 11/24/2022   Lipoma of forearm 07/24/2021   Insomnia 06/11/2021   Acute right-sided low back pain with right-sided sciatica 12/04/2020   Anemia 01/29/2020    Allergies: No Known Allergies Medications:  Current Outpatient Medications:    diphenoxylate -atropine  (LOMOTIL ) 2.5-0.025 MG tablet, Take 1 tablet by mouth 4 (four) times daily as needed for diarrhea or loose stools., Disp: 16 tablet, Rfl: 0   ondansetron  (ZOFRAN -ODT) 4 MG disintegrating tablet, Take 1 tablet (4 mg total) by mouth every 8 (eight) hours as needed for nausea or vomiting., Disp: 10 tablet, Rfl: 0  Observations/Objective: Patient is well-developed, well-nourished in no acute distress.  Resting comfortably  at home.  Head is normocephalic, atraumatic.  No labored breathing.  Speech is clear and coherent with logical content.  Patient is alert and oriented at baseline.    Assessment  and Plan: 1. Stress (Primary)  Patient presenting with stress after loss of child's father and best friend. No SI or HI. She does endorse issues with sleep and has palpitations intermittently no chest pain and no current symptoms at this time. Hydroxyzine  prescribed to assist with sleep and patient advised to follow up with PCP  to continue her previous anxiety med.   Follow Up Instructions: I discussed the assessment and treatment plan with the patient. The patient was provided an opportunity to ask questions and all were answered. The patient agreed with the plan and demonstrated an understanding of the instructions.  A copy of instructions were sent to the patient via MyChart unless otherwise noted below.    The patient was advised to call back or seek an in-person evaluation if the symptoms worsen or if the condition fails to improve as anticipated.    Teena Shuck, PA-C

## 2024-05-02 NOTE — Patient Instructions (Signed)
  [name], thank you for joining Teena Shuck, PA-C for today's virtual visit.  While this provider is not your primary care provider (PCP), if your PCP is located in our provider database this encounter information will be shared with them immediately following your visit.   A [facility] MyChart account gives you access to today's visit and all your visits, tests, and labs performed at [facility]  click here if you don't have a [facility] MyChart account or go to [email or web address]  Consent: (Patient) [name] provided verbal consent for this virtual visit at the beginning of the encounter.  Current Medications:  Current Outpatient Medications:    diphenoxylate -atropine  (LOMOTIL ) 2.5-0.025 MG tablet, Take 1 tablet by mouth 4 (four) times daily as needed for diarrhea or loose stools., Disp: 16 tablet, Rfl: 0   ondansetron  (ZOFRAN -ODT) 4 MG disintegrating tablet, Take 1 tablet (4 mg total) by mouth every 8 (eight) hours as needed for nausea or vomiting., Disp: 10 tablet, Rfl: 0   Medications ordered in this encounter:  No orders of the defined types were placed in this encounter.    *If you need refills on other medications prior to your next appointment, please contact your pharmacy*  Follow-Up: Call back or seek an in-person evaluation if the symptoms worsen or if the condition fails to improve as anticipated.  [facility] [phone]  Other Instructions Please report to the nearest Emergency room with any worsening symptoms. Follow up with primary care provider (PCP) in 2 -3 days.    If you have been instructed to have an in-person evaluation today at a local Urgent Care facility, please use the link below. It will take you to a list of all of our available [facility], including address, phone number and hours of operation. Please do not delay care.  [facility]  If you or a family member do not  have a primary care provider, use the link below to schedule a visit and establish care. When you choose a [facility] primary care physician or advanced practice provider, you gain a long-term partner in health. Find a Primary Care Provider  Learn more about [facility]'s in-office and virtual care options: [facility] Get Care Now

## 2024-05-02 NOTE — Telephone Encounter (Signed)
 Noted

## 2024-05-15 ENCOUNTER — Ambulatory Visit
Admission: RE | Admit: 2024-05-15 | Discharge: 2024-05-15 | Disposition: A | Discharge status: Home / Self Care | Source: Ambulatory Visit | Attending: Physician Assistant | Admitting: Physician Assistant

## 2024-05-15 ENCOUNTER — Other Ambulatory Visit: Payer: Self-pay

## 2024-05-15 VITALS — BP 101/69 | HR 79 | Temp 98.1°F | Resp 18

## 2024-05-15 DIAGNOSIS — R1030 Lower abdominal pain, unspecified: Secondary | ICD-10-CM | POA: Diagnosis not present

## 2024-05-15 DIAGNOSIS — M545 Low back pain, unspecified: Secondary | ICD-10-CM | POA: Insufficient documentation

## 2024-05-15 DIAGNOSIS — R102 Pelvic and perineal pain: Secondary | ICD-10-CM | POA: Diagnosis not present

## 2024-05-15 DIAGNOSIS — Z113 Encounter for screening for infections with a predominantly sexual mode of transmission: Secondary | ICD-10-CM | POA: Insufficient documentation

## 2024-05-15 LAB — POCT URINALYSIS DIP (MANUAL ENTRY)
Bilirubin, UA: NEGATIVE
Blood, UA: NEGATIVE
Glucose, UA: NEGATIVE mg/dL
Ketones, POC UA: NEGATIVE mg/dL
Leukocytes, UA: NEGATIVE
Nitrite, UA: NEGATIVE
Spec Grav, UA: >=1.03 — AB (ref 1.010–1.025)
Urobilinogen, UA: 1 U/dL
pH, UA: 5.5 (ref 5.0–8.0)

## 2024-05-15 LAB — POCT URINE PREGNANCY: Preg Test, Ur: NEGATIVE

## 2024-05-15 MED ORDER — METRONIDAZOLE 500 MG PO TABS: 500.0000 mg | ORAL_TABLET | Freq: Two times a day (BID) | ORAL | 0 refills | Status: DC

## 2024-05-15 MED ORDER — DOXYCYCLINE HYCLATE 100 MG PO CAPS: 100.0000 mg | ORAL_CAPSULE | Freq: Two times a day (BID) | ORAL | 0 refills | Status: DC

## 2024-05-15 MED ORDER — CEFTRIAXONE SODIUM 500 MG IJ SOLR
500.0000 mg | INTRAMUSCULAR | Status: DC
  Administered 2024-05-15: 500 mg via INTRAMUSCULAR

## 2024-05-15 NOTE — ED Triage Notes (Signed)
 Pt reports low back pain x 1 week. States she is also having lower abdominal pain. Denies UTI symptoms. Denies vaginal discharge. Denies fever. Endorses nausea. States she took aspirin earlier for the back pain.

## 2024-05-15 NOTE — Discharge Instructions (Signed)
 We gave an injection today to cover for a pelvic infection.  Please start doxycycline  metronidazole  as prescribed.  Stay out of the sun while on doxycycline  as this will cause you to have a bad sunburn.  Do not drink any alcohol while on metronidazole  and for 3 days after you finish it as it will cause you to vomit.  We will contact you if any of your testing is abnormal.  Use over-the-counter medicine such as Tylenol  and ibuprofen  for pain.  I have given you the information for OB/GYN and I would recommend following up with them to make sure that everything is improving.  If anything worsens and you have abdominal pain, severe pelvic pain, fever, nausea, vomiting you need to go to the emergency room immediately.

## 2024-05-15 NOTE — ED Provider Notes (Signed)
 [name]    CSN: [id number] Arrival date & time: 05/15/24  1624      History   Chief Complaint Chief Complaint  Patient presents with   Back Pain    HPI [name] is a 43 y.o. female.   Patient presents today with a weeklong history of lower back pain and lower abdominal pain.  She reports that the pain is connected but she is unsure if it is radiating from her lower abdomen or from her back.  She denies any urinary symptoms including frequency, urgency, hematuria.  Denies any vaginal discharge.  Denies any fever, nausea, vomiting, diarrhea.  She does not believe that she could be pregnant but is open to testing.  She has taken Tylenol  and ibuprofen  without improvement of symptoms.  Denies any recent injury or trauma including a car accident.  Denies any recent falls.  She denies history of previous spinal surgery or injury.  Denies history of malignancy.  She reports that pain is rated 8 on a 0-10 pain scale, described as aching with periodic sharp pains, worse with bending forward, no alleviating factors identified.  Her symptoms do improve temporarily with over-the-counter analgesics but tend to recur very quickly.    Past Medical History:  Diagnosis Date   Abnormal Pap smear    Anemia    BV (bacterial vaginosis) 06/24/2022   Candida vaginitis 05/06/2022   Chlamydia    Gonorrhea    Headache(784.0)    Herpes simplex type 1 infection    Herpes simplex type 2 infection    HPV (human papilloma virus) infection    HSV (herpes simplex virus) infection 03/11/2022   Trichomonas    Urinary tract infection     Patient Active Problem List   Diagnosis Date Noted   Radiculopathy, cervical region 11/24/2022   Lipoma of forearm 07/24/2021   Insomnia 06/11/2021   Acute right-sided low back pain with right-sided sciatica 12/04/2020   Anemia 01/29/2020    Past Surgical History:  Procedure Laterality Date   CESAREAN SECTION  08/31/2011   Procedure: CESAREAN  SECTION;  Surgeon: Olam DELENA Mill, MD;  Location: [facility];  Service: Gynecology;  Laterality: N/A;  Primary Cesarean section Baby Boy @ 1418   COLPOSCOPY  2010   LEEP     LIPOMA EXCISION Right 08/08/2021   Procedure: RIGHT FOREARM MASS EXCISION;  Surgeon: Romona Harari, MD;  Location: [facility];  Service: Orthopedics;  Laterality: Right;    OB History     Gravida  3   Para  3   Term  2   Preterm  1   AB      Living  3      SAB      IAB      Ectopic      Multiple      Live Births  3            Home Medications    Prior to Admission medications   Medication Sig Start Date End Date Taking? Authorizing Provider  doxycycline  (VIBRAMYCIN ) 100 MG capsule Take 1 capsule (100 mg total) by mouth 2 (two) times daily. 05/15/24  Yes Emmaly Leech K, PA-C  metroNIDAZOLE  (FLAGYL ) 500 MG tablet Take 1 tablet (500 mg total) by mouth 2 (two) times daily. 05/15/24  Yes Lyrika Souders, Rocky POUR, PA-C    Family History Family History  Problem Relation Age of Onset   Hypertension Mother    Diabetes Mother  Stroke Mother    Heart disease Brother    Heart attack Brother 40    Social History Social History   Tobacco Use   Smoking status: Former    Current packs/day: 0.25    Average packs/day: 0.3 packs/day for 5.0 years (1.3 ttl pk-yrs)    Types: Cigarettes    Passive exposure: Past   Smokeless tobacco: Never   Tobacco comments:    quit with + preg  Vaping Use   Vaping status: Never Used  Substance Use Topics   Alcohol use: Not Currently    Alcohol/week: 1.0 standard drink of alcohol    Types: 1 Standard drinks or equivalent per week    Comment: rare   Drug use: No     Allergies   Patient has no known allergies.   Review of Systems Review of Systems  Constitutional:  Positive for activity change. Negative for appetite change, fatigue and fever.  Gastrointestinal:  Positive for abdominal pain. Negative for diarrhea, nausea and vomiting.   Genitourinary:  Negative for dysuria, frequency, urgency, vaginal bleeding, vaginal discharge and vaginal pain.  Musculoskeletal:  Positive for back pain. Negative for arthralgias and myalgias.  Skin:  Negative for rash.     Physical Exam Triage Vital Signs ED Triage Vitals  Encounter Vitals Group     BP 05/15/24 1638 101/69     Girls Systolic BP Percentile --      Girls Diastolic BP Percentile --      Boys Systolic BP Percentile --      Boys Diastolic BP Percentile --      Pulse Rate 05/15/24 1638 79     Resp 05/15/24 1638 18     Temp 05/15/24 1638 98.1 F (36.7 C)     Temp Source 05/15/24 1638 Oral     SpO2 05/15/24 1638 98 %     Weight --      Height --      Head Circumference --      Peak Flow --      Pain Score 05/15/24 1636 8     Pain Loc --      Pain Education --      Exclude from Growth Chart --    No data found.  Updated Vital Signs BP 101/69 (BP Location: Left Arm)   Pulse 79   Temp 98.1 F (36.7 C) (Oral)   Resp 18   LMP 04/24/2024   SpO2 98%   Visual Acuity Right Eye Distance:   Left Eye Distance:   Bilateral Distance:    Right Eye Near:   Left Eye Near:    Bilateral Near:     Physical Exam Vitals reviewed. Exam conducted with a chaperone present.  Constitutional:      General: She is awake. She is not in acute distress.    Appearance: Normal appearance. She is well-developed. She is not ill-appearing.     Comments: Very pleasant female appears stated age in no acute distress sitting comfortably in exam room  HENT:     Head: Normocephalic and atraumatic.  Cardiovascular:     Rate and Rhythm: Normal rate and regular rhythm.     Heart sounds: Normal heart sounds, S1 normal and S2 normal. No murmur heard. Pulmonary:     Effort: Pulmonary effort is normal.     Breath sounds: Normal breath sounds. No wheezing, rhonchi or rales.     Comments: Clear to auscultation bilaterally Abdominal:     General: Bowel sounds are  normal.     Palpations:  Abdomen is soft.     Tenderness: There is abdominal tenderness in the suprapubic area. There is no right CVA tenderness, left CVA tenderness, guarding or rebound. Negative signs include Rovsing's sign, McBurney's sign and psoas sign.     Comments: Mild tenderness palpation in the suprapubic region.  No evidence of acute abdomen on physical exam.  Genitourinary:    Labia:        Right: No rash or tenderness.        Left: No rash or tenderness.      Vagina: Vaginal discharge and tenderness present.     Cervix: Cervical motion tenderness present.     Uterus: Normal.      Adnexa:        Right: No mass or tenderness.         Left: No mass or tenderness.       Comments: Jasmyne CMA present during exam as chaperone.  Patient has copious thin white discharge in posterior vaginal vault with associated CMT tenderness but no adnexal tenderness. Musculoskeletal:     Cervical back: No tenderness or bony tenderness.     Thoracic back: No tenderness or bony tenderness.     Lumbar back: Tenderness and bony tenderness present. Negative right straight leg raise test and negative left straight leg raise test.     Comments: Mild tenderness palpation of bilateral lumbar paraspinal muscles.  No spasm or deformity noted.  Pain on percussion of lumbar vertebrae without step-off.  Strength 5/5 bilateral lower extremities.  Psychiatric:        Behavior: Behavior is cooperative.      UC Treatments / Results  Labs (all labs ordered are listed, but only abnormal results are displayed) Labs Reviewed  POCT URINALYSIS DIP (MANUAL ENTRY) - Abnormal; Notable for the following components:      Result Value   Clarity, UA cloudy (*)    Spec Grav, UA >=1.030 (*)    Protein Ur, POC trace (*)    All other components within normal limits  POCT URINE PREGNANCY - Normal  URINE CULTURE  HIV ANTIBODY (ROUTINE TESTING W REFLEX)  RPR  BASIC METABOLIC PANEL WITH GFR  CBC WITH DIFFERENTIAL/PLATELET  CERVICOVAGINAL  ANCILLARY ONLY    EKG   Radiology No results found.  Procedures Procedures (including critical care time)  Medications Ordered in UC Medications  cefTRIAXone  (ROCEPHIN ) injection 500 mg (500 mg Intramuscular Given 05/15/24 1758)    Initial Impression / Assessment and Plan / UC Course  I have reviewed the triage vital signs and the nursing notes.  Pertinent labs & imaging results that were available during my care of the patient were reviewed by me and considered in my medical decision making (see chart for details).     Patient is well-appearing, afebrile, nontoxic, nontachycardic.  Patient had significant suprapubic tenderness on exam but no evidence of acute infection.  UA showed concentrated urine without findings of acute cystitis as of symptoms for culture but defer additional antibiotics until culture results are available.  She reported ongoing pelvic discomfort and so pelvic exam was obtained that did show CMT tenderness we discussed concern for PID as contributing to her discomfort.  She will be treated with Rocephin  500 mg in clinic and 2 weeks of metronidazole  and doxycycline .  We discussed that she should avoid prolonged sun exposure while on Doxy due to concern for photosensitivity.  She is to avoid alcohol while taking metronidazole  due to concern  for Antabuse side effects.  We discussed that it is possible she is also experiencing musculoskeletal etiology of back pain and recommended she use over-the-counter medications such as Tylenol  and ibuprofen  for pain relief.  I recommend she follow-up closely with both her primary care and OB/GYN and she was given the contact information for local provider.  STI swab was collected as well as a screening testing for HIV and syphilis and is pending.  We will contact her if we need to arrange additional treatment based on these results.  Basic lab work including CBC and BMP were obtained we will contact her if these show significant  leukocytosis or abnormal kidney function that would warrant going to the ER.  Strict return precautions given.  All questions were answered to patient satisfaction.  Excuse note provided.  Final Clinical Impressions(s) / UC Diagnoses   Final diagnoses:  Lower abdominal pain  Acute bilateral low back pain without sciatica  Pelvic pain  Screening examination for STI     Discharge Instructions      We gave an injection today to cover for a pelvic infection.  Please start doxycycline  metronidazole  as prescribed.  Stay out of the sun while on doxycycline  as this will cause you to have a bad sunburn.  Do not drink any alcohol while on metronidazole  and for 3 days after you finish it as it will cause you to vomit.  We will contact you if any of your testing is abnormal.  Use over-the-counter medicine such as Tylenol  and ibuprofen  for pain.  I have given you the information for OB/GYN and I would recommend following up with them to make sure that everything is improving.  If anything worsens and you have abdominal pain, severe pelvic pain, fever, nausea, vomiting you need to go to the emergency room immediately.     ED Prescriptions     Medication Sig Dispense Auth. Provider   metroNIDAZOLE  (FLAGYL ) 500 MG tablet Take 1 tablet (500 mg total) by mouth 2 (two) times daily. 14 tablet Athen Riel K, PA-C   doxycycline  (VIBRAMYCIN ) 100 MG capsule Take 1 capsule (100 mg total) by mouth 2 (two) times daily. 28 capsule Vondra Aldredge K, PA-C      PDMP not reviewed this encounter.   Sherrell Rocky POUR, PA-C 05/15/24 1829

## 2024-05-16 ENCOUNTER — Ambulatory Visit (HOSPITAL_COMMUNITY): Payer: Self-pay

## 2024-05-16 LAB — CERVICOVAGINAL ANCILLARY ONLY
Bacterial Vaginitis (gardnerella): POSITIVE — AB
Candida Glabrata: NEGATIVE
Candida Vaginitis: NEGATIVE
Chlamydia: NEGATIVE
Comment: NEGATIVE
Comment: NEGATIVE
Comment: NEGATIVE
Comment: NEGATIVE
Comment: NEGATIVE
Comment: NORMAL
Neisseria Gonorrhea: NEGATIVE
Trichomonas: NEGATIVE

## 2024-05-16 LAB — CBC WITH DIFFERENTIAL/PLATELET
Basophils Absolute: 0 x10E3/uL (ref 0.0–0.2)
Basos: 0 %
EOS (ABSOLUTE): 0.1 x10E3/uL (ref 0.0–0.4)
Eos: 1 %
Hematocrit: 36.2 % (ref 34.0–46.6)
Hemoglobin: 11.8 g/dL (ref 11.1–15.9)
Immature Grans (Abs): 0 x10E3/uL (ref 0.0–0.1)
Immature Granulocytes: 0 %
Lymphocytes Absolute: 2.7 x10E3/uL (ref 0.7–3.1)
Lymphs: 40 %
MCH: 30.6 pg (ref 26.6–33.0)
MCHC: 32.6 g/dL (ref 31.5–35.7)
MCV: 94 fL (ref 79–97)
Monocytes Absolute: 0.3 x10E3/uL (ref 0.1–0.9)
Monocytes: 5 %
Neutrophils Absolute: 3.6 x10E3/uL (ref 1.4–7.0)
Neutrophils: 54 %
Platelets: 251 x10E3/uL (ref 150–450)
RBC: 3.85 x10E6/uL (ref 3.77–5.28)
RDW: 11.7 % (ref 11.7–15.4)
WBC: 6.7 x10E3/uL (ref 3.4–10.8)

## 2024-05-16 LAB — BASIC METABOLIC PANEL WITH GFR
BUN/Creatinine Ratio: 12 (ref 9–23)
BUN: 10 mg/dL (ref 6–24)
CO2: 21 mmol/L (ref 20–29)
Calcium: 9.1 mg/dL (ref 8.7–10.2)
Chloride: 102 mmol/L (ref 96–106)
Creatinine, Ser: 0.83 mg/dL (ref 0.57–1.00)
Glucose: 86 mg/dL (ref 70–99)
Potassium: 4.2 mmol/L (ref 3.5–5.2)
Sodium: 135 mmol/L (ref 134–144)
eGFR: 90 mL/min/1.73 (ref >59)

## 2024-05-16 LAB — RPR: RPR Ser Ql: NONREACTIVE

## 2024-05-16 LAB — HIV ANTIBODY (ROUTINE TESTING W REFLEX): HIV Screen 4th Generation wRfx: NONREACTIVE

## 2024-05-17 LAB — URINE CULTURE: Culture: >=100000 — AB

## 2024-06-02 ENCOUNTER — Encounter: Payer: Self-pay | Admitting: Family

## 2024-06-02 ENCOUNTER — Ambulatory Visit (INDEPENDENT_AMBULATORY_CARE_PROVIDER_SITE_OTHER): Admitting: Family

## 2024-06-02 VITALS — BP 110/75 | HR 83 | Temp 98.3°F | Resp 16 | Ht 68.0 in | Wt 187.0 lb

## 2024-06-02 DIAGNOSIS — F32A Depression, unspecified: Secondary | ICD-10-CM

## 2024-06-02 DIAGNOSIS — R1030 Lower abdominal pain, unspecified: Secondary | ICD-10-CM | POA: Diagnosis not present

## 2024-06-02 DIAGNOSIS — M545 Low back pain, unspecified: Secondary | ICD-10-CM | POA: Diagnosis not present

## 2024-06-02 DIAGNOSIS — Z113 Encounter for screening for infections with a predominantly sexual mode of transmission: Secondary | ICD-10-CM

## 2024-06-02 DIAGNOSIS — F419 Anxiety disorder, unspecified: Secondary | ICD-10-CM

## 2024-06-02 DIAGNOSIS — R102 Pelvic and perineal pain: Secondary | ICD-10-CM | POA: Diagnosis not present

## 2024-06-02 MED ORDER — HYDROXYZINE PAMOATE 25 MG PO CAPS
25.0000 mg | ORAL_CAPSULE | Freq: Three times a day (TID) | ORAL | 1 refills | Status: AC | PRN
Start: 2024-06-02 — End: ?

## 2024-06-02 NOTE — Progress Notes (Signed)
 Patient ID: [name], female    DOB: 1981/06/26  MRN: [id number]  CC: Urgent Care Follow-Up  Subjective: Jerni Selmer is a 43 y.o. female who presents for Urgent Care follow-up.  Her concerns today include:  - Patient seen on 05/15/2024 (1 hours) at [facility]) for lower abdominal pain, acute bilateral lower back pain without sciatica and additional diagnoses. Today patients states she is feeling improved.  - Anxiety depression. States she was recently seen by a TeleDoc and prescribed Hydroxyzine  which helped. States she would like referral to therapist for grief counseling. She denies thoughts of self-harm, suicidal ideations, homicidal ideations.  Patient Active Problem List   Diagnosis Date Noted   Radiculopathy, cervical region 11/24/2022   Lipoma of forearm 07/24/2021   Insomnia 06/11/2021   Acute right-sided low back pain with right-sided sciatica 12/04/2020   Anemia 01/29/2020     Current Outpatient Medications on File Prior to Visit  Medication Sig Dispense Refill   doxycycline  (VIBRAMYCIN ) 100 MG capsule Take 1 capsule (100 mg total) by mouth 2 (two) times daily. (Patient not taking: Reported on 06/02/2024) 28 capsule 0   metroNIDAZOLE  (FLAGYL ) 500 MG tablet Take 1 tablet (500 mg total) by mouth 2 (two) times daily. (Patient not taking: Reported on 06/02/2024) 14 tablet 0   No current facility-administered medications on file prior to visit.    No Known Allergies  Social History   Socioeconomic History   Marital status: Single    Spouse name: Not on file   Number of children: 3   Years of education: Not on file   Highest education level: Not on file  Occupational History   Not on file  Tobacco Use   Smoking status: Former    Current packs/day: 0.25    Average packs/day: 0.3 packs/day for 5.0 years (1.3 ttl pk-yrs)    Types: Cigarettes    Passive exposure: Past   Smokeless tobacco: Never   Tobacco comments:     quit with + preg  Vaping Use   Vaping status: Never Used  Substance and Sexual Activity   Alcohol use: Not Currently    Alcohol/week: 1.0 standard drink of alcohol    Types: 1 Standard drinks or equivalent per week    Comment: rare   Drug use: No   Sexual activity: Yes    Partners: Male    Birth control/protection: Condom    Comment: menarche 43yo, sexual debut 43yo  Other Topics Concern   Not on file  Social History Narrative   Not on file   Social Drivers of Health   Financial Resource Strain: Low Risk  (06/02/2024)   Overall Financial Resource Strain (CARDIA)    Difficulty of Paying Living Expenses: Not hard at all  Food Insecurity: No Food Insecurity (06/02/2024)   Hunger Vital Sign    Worried About Running Out of Food in the Last Year: Never true    Ran Out of Food in the Last Year: Never true  Transportation Needs: No Transportation Needs (06/02/2024)   PRAPARE - Administrator, Civil Service (Medical): No    Lack of Transportation (Non-Medical): No  Physical Activity: Sufficiently Active (06/02/2024)   Exercise Vital Sign    Days of Exercise per Week: 7 days    Minutes of Exercise per Session: 30 min  Stress: No Stress Concern Present (06/02/2024)   [employer] of Occupational Health - Occupational Stress Questionnaire    Feeling of  Stress: Only a little  Social Connections: Moderately Isolated (06/02/2024)   Social Connection and Isolation Panel    Frequency of Communication with Friends and Family: More than three times a week    Frequency of Social Gatherings with Friends and Family: More than three times a week    Attends Religious Services: Never    Database administrator or Organizations: Yes    Attends Engineer, structural: More than 4 times per year    Marital Status: Never married  Intimate Partner Violence: Not At Risk (06/02/2024)   Humiliation, Afraid, Rape, and Kick questionnaire    Fear of Current or Ex-Partner: No    Emotionally  Abused: No    Physically Abused: No    Sexually Abused: No    Family History  Problem Relation Age of Onset   Hypertension Mother    Diabetes Mother    Stroke Mother    Heart disease Brother    Heart attack Brother 55    Past Surgical History:  Procedure Laterality Date   CESAREAN SECTION  08/31/2011   Procedure: CESAREAN SECTION;  Surgeon: Olam DELENA Mill, MD;  Location: [facility];  Service: Gynecology;  Laterality: N/A;  Primary Cesarean section Baby Boy @ 1418   COLPOSCOPY  2010   LEEP     LIPOMA EXCISION Right 08/08/2021   Procedure: RIGHT FOREARM MASS EXCISION;  Surgeon: Romona Harari, MD;  Location: [facility];  Service: Orthopedics;  Laterality: Right;    ROS: Review of Systems Negative except as stated above  PHYSICAL EXAM: BP 110/75   Pulse 83   Temp 98.3 F (36.8 C) (Oral)   Resp 16   Ht 5' 8 (1.727 m)   Wt 187 lb (84.8 kg)   LMP 05/21/2024 (Exact Date)   SpO2 96%   BMI 28.43 kg/m   Physical Exam HENT:     Head: Normocephalic and atraumatic.     Nose: Nose normal.     Mouth/Throat:     Mouth: Mucous membranes are moist.     Pharynx: Oropharynx is clear.  Eyes:     Extraocular Movements: Extraocular movements intact.     Conjunctiva/sclera: Conjunctivae normal.     Pupils: Pupils are equal, round, and reactive to light.  Cardiovascular:     Rate and Rhythm: Normal rate and regular rhythm.     Pulses: Normal pulses.     Heart sounds: Normal heart sounds.  Pulmonary:     Effort: Pulmonary effort is normal.     Breath sounds: Normal breath sounds.  Abdominal:     General: Bowel sounds are normal.     Palpations: Abdomen is soft.  Musculoskeletal:        General: Normal range of motion.     Cervical back: Normal range of motion and neck supple.  Neurological:     General: No focal deficit present.     Mental Status: She is alert and oriented to person, place, and time.  Psychiatric:        Mood and Affect: Mood normal.         Behavior: Behavior normal.     ASSESSMENT AND PLAN: 1. Lower abdominal pain (Primary) 2. Acute bilateral low back pain without sciatica - Resolved.  3. Pelvic pain 4. Screening examination for STI - Referral to Gynecology for evaluation/management. - Ambulatory referral to Gynecology  5. Anxiety and depression - Patient denies thoughts of self-harm, suicidal ideations, homicidal ideations. - Continue Hydroxyzine  as prescribed.  Counseled on medication adherence/adverse effects.  - Referral to Psychiatry for evaluation/management.  - Follow-up with primary provider as scheduled. - hydrOXYzine  (VISTARIL ) 25 MG capsule; Take 1 capsule (25 mg total) by mouth 3 (three) times daily as needed.  Dispense: 90 capsule; Refill: 1 - Ambulatory referral to Psychiatry   Patient was given the opportunity to ask questions.  Patient verbalized understanding of the plan and was able to repeat key elements of the plan. Patient was given clear instructions to go to Emergency Department or return to medical center if symptoms don't improve, worsen, or new problems develop.The patient verbalized understanding.   Orders Placed This Encounter  Procedures   Ambulatory referral to Gynecology   Ambulatory referral to Psychiatry     Requested Prescriptions   Signed Prescriptions Disp Refills   hydrOXYzine  (VISTARIL ) 25 MG capsule 90 capsule 1    Sig: Take 1 capsule (25 mg total) by mouth 3 (three) times daily as needed.    Follow-Up with primary provider as scheduled.  Greig JINNY Drones, NP

## 2024-06-02 NOTE — Progress Notes (Signed)
 Medication refill, patient scored a 19 on the GAD_7

## 2024-07-30 VITALS — BP 110/75 | HR 88 | Temp 97.9°F | Resp 16 | Wt 186.9 lb

## 2024-07-30 DIAGNOSIS — N898 Other specified noninflammatory disorders of vagina: Secondary | ICD-10-CM

## 2024-07-30 DIAGNOSIS — R350 Frequency of micturition: Secondary | ICD-10-CM

## 2024-07-30 DIAGNOSIS — R300 Dysuria: Secondary | ICD-10-CM | POA: Diagnosis not present

## 2024-07-30 LAB — POCT URINE DIPSTICK
Bilirubin, UA: NEGATIVE
Blood, UA: NEGATIVE
Glucose, UA: NEGATIVE mg/dL
Ketones, POC UA: NEGATIVE mg/dL
Leukocytes, UA: NEGATIVE
Nitrite, UA: NEGATIVE
POC PROTEIN,UA: NEGATIVE
Spec Grav, UA: 1.02 (ref 1.010–1.025)
Urobilinogen, UA: 1 U/dL
pH, UA: 8 (ref 5.0–8.0)

## 2024-07-30 LAB — POCT URINE PREGNANCY: Preg Test, Ur: NEGATIVE

## 2024-07-30 MED ORDER — PHENAZOPYRIDINE HCL 200 MG PO TABS: 200.0000 mg | ORAL_TABLET | ORAL | 0 refills | Status: AC

## 2024-07-30 MED ORDER — FLUCONAZOLE 150 MG PO TABS: 150.0000 mg | ORAL_TABLET | ORAL | 0 refills | Status: AC

## 2024-07-30 NOTE — ED Triage Notes (Addendum)
 Pt presents c/o abd pain and frequent urination x 3 days. Pt reports the pain is located in lower abd and pressure after urinating. Pt denies emesis or any additional sxs. Pt is requesting STD testing.

## 2024-07-30 NOTE — ED Provider Notes (Signed)
 [facility]    CSN: [id number] Arrival date & time: 07/30/24  1231      History   Chief Complaint Chief Complaint  Patient presents with   Abdominal Pain    Entered by patient    HPI [name] is a 43 y.o. female.   Discussed the use of AI scribe software for clinical note transcription with the patient, who gave verbal consent to proceed.   The patient presents with complaints of urinary frequency and painful urination, though she denies burning with urination. She also reports vaginal itching but denies vaginal irritation or abnormal discharge. She describes lower abdominal discomfort localized to the suprapubic region. She denies fever, vomiting, or low back pain. Her last menstrual period was approximately two weeks ago, around September 20-21. She is not currently using birth control. The patient is sexually active with one female partner within the past three months and does not use condoms.  The following sections of the patient's history were reviewed and updated as appropriate: allergies, current medications, past family history, past medical history, past social history, past surgical history, and problem list.     Past Medical History:  Diagnosis Date   Abnormal Pap smear    Anemia    BV (bacterial vaginosis) 06/24/2022   Candida vaginitis 05/06/2022   Chlamydia    Gonorrhea    Headache(784.0)    Herpes simplex type 1 infection    Herpes simplex type 2 infection    HPV (human papilloma virus) infection    HSV (herpes simplex virus) infection 03/11/2022   Trichomonas    Urinary tract infection     Patient Active Problem List   Diagnosis Date Noted   Radiculopathy, cervical region 11/24/2022   Lipoma of forearm 07/24/2021   Insomnia 06/11/2021   Acute right-sided low back pain with right-sided sciatica 12/04/2020   Anemia 01/29/2020    Past Surgical History:  Procedure Laterality Date   CESAREAN SECTION  08/31/2011   Procedure:  CESAREAN SECTION;  Surgeon: Olam DELENA Mill, MD;  Location: [facility];  Service: Gynecology;  Laterality: N/A;  Primary Cesarean section Baby Boy @ 1418   COLPOSCOPY  2010   LEEP     LIPOMA EXCISION Right 08/08/2021   Procedure: RIGHT FOREARM MASS EXCISION;  Surgeon: Romona Harari, MD;  Location: [facility];  Service: Orthopedics;  Laterality: Right;    OB History     Gravida  3   Para  3   Term  2   Preterm  1   AB      Living  3      SAB      IAB      Ectopic      Multiple      Live Births  3            Home Medications    Prior to Admission medications   Medication Sig Start Date End Date Taking? Authorizing Provider  fluconazole  (DIFLUCAN ) 150 MG tablet Take 1 tablet (150 mg total) by mouth every 3 (three) days for 2 doses. 07/30/24 08/03/24 Yes Sandip Power, FNP  phenazopyridine (PYRIDIUM) 200 MG tablet Take 1 tablet (200 mg total) by mouth 3 (three) times daily at 8am, 3pm and bedtime for 2 days. 07/30/24 08/01/24 Yes Iola Lukes, FNP  hydrOXYzine  (VISTARIL ) 25 MG capsule Take 1 capsule (25 mg total) by mouth 3 (three) times daily as needed. 06/02/24   Lorren Greig PARAS, NP    Family  History Family History  Problem Relation Age of Onset   Hypertension Mother    Diabetes Mother    Stroke Mother    Heart disease Brother    Heart attack Brother 42    Social History Social History   Tobacco Use   Smoking status: Former    Current packs/day: 0.25    Average packs/day: 0.3 packs/day for 5.0 years (1.3 ttl pk-yrs)    Types: Cigarettes    Passive exposure: Past   Smokeless tobacco: Never   Tobacco comments:    quit with + preg  Vaping Use   Vaping status: Never Used  Substance Use Topics   Alcohol use: Not Currently    Alcohol/week: 1.0 standard drink of alcohol    Types: 1 Standard drinks or equivalent per week    Comment: rare   Drug use: No     Allergies   Patient has no known allergies.   Review of  Systems Review of Systems  Constitutional:  Negative for fever.  Gastrointestinal:  Positive for abdominal pain (lower, suprapubic) and nausea. Negative for vomiting.  Genitourinary:  Positive for dysuria and frequency. Negative for menstrual problem (LMP around 09/20) and vaginal discharge.       Vaginal itching. No irritation or odor.   Musculoskeletal:  Negative for back pain.  All other systems reviewed and are negative.    Physical Exam Triage Vital Signs ED Triage Vitals  Encounter Vitals Group     BP 07/30/24 1239 110/75     Girls Systolic BP Percentile --      Girls Diastolic BP Percentile --      Boys Systolic BP Percentile --      Boys Diastolic BP Percentile --      Pulse Rate 07/30/24 1239 88     Resp 07/30/24 1239 16     Temp 07/30/24 1239 97.9 F (36.6 C)     Temp Source 07/30/24 1239 Oral     SpO2 07/30/24 1239 97 %     Weight 07/30/24 1238 186 lb 15.2 oz (84.8 kg)     Height --      Head Circumference --      Peak Flow --      Pain Score 07/30/24 1237 8     Pain Loc --      Pain Education --      Exclude from Growth Chart --    No data found.  Updated Vital Signs BP 110/75 (BP Location: Left Arm)   Pulse 88   Temp 97.9 F (36.6 C) (Oral)   Resp 16   Wt 186 lb 15.2 oz (84.8 kg)   LMP 07/23/2024 (Exact Date)   SpO2 97%   BMI 28.43 kg/m   Visual Acuity Right Eye Distance:   Left Eye Distance:   Bilateral Distance:    Right Eye Near:   Left Eye Near:    Bilateral Near:     Physical Exam Constitutional:      General: She is not in acute distress.    Appearance: Normal appearance. She is not ill-appearing, toxic-appearing or diaphoretic.  HENT:     Head: Normocephalic.     Nose: Nose normal.     Mouth/Throat:     Mouth: Mucous membranes are moist.  Eyes:     Conjunctiva/sclera: Conjunctivae normal.  Cardiovascular:     Rate and Rhythm: Normal rate.  Pulmonary:     Effort: Pulmonary effort is normal.  Abdominal:     Palpations:  Abdomen is soft.  Genitourinary:    Comments: Deferred; patient performed self-swab for Aptima testing  Musculoskeletal:        General: Normal range of motion.     Cervical back: Normal range of motion and neck supple.  Skin:    General: Skin is warm and dry.  Neurological:     General: No focal deficit present.     Mental Status: She is alert and oriented to person, place, and time.  Psychiatric:        Mood and Affect: Mood normal.        Behavior: Behavior normal.      UC Treatments / Results  Labs (all labs ordered are listed, but only abnormal results are displayed) Labs Reviewed  POCT URINE DIPSTICK - Abnormal; Notable for the following components:      Result Value   Clarity, UA hazy (*)    All other components within normal limits  POCT URINE PREGNANCY - Normal  URINE CULTURE  CERVICOVAGINAL ANCILLARY ONLY    EKG   Radiology No results found.  Procedures Procedures (including critical care time)  Medications Ordered in UC Medications - No data to display  Initial Impression / Assessment and Plan / UC Course  I have reviewed the triage vital signs and the nursing notes.  Pertinent labs & imaging results that were available during my care of the patient were reviewed by me and considered in my medical decision making (see chart for details).     The patient presents with urinary frequency and dysuria along with vaginal itching. She denies associated irritation or abnormal discharge. Point-of-care urine dipstick is negative for infection, though the sample appeared slightly hazy. Given the combination of urinary and vaginal symptoms, the differential includes urinary tract infection, bacterial vaginosis, vulvovaginal candidiasis, and sexually transmitted infections such as gonorrhea, chlamydia, or trichomoniasis. Her last menstrual period was approximately 09/20, and she is not currently using birth control.  Vaginal swabs were obtained for BV, yeast,  gonorrhea, chlamydia, and trichomoniasis testing. A urine culture was also sent to definitively rule out UTI. Empiric treatment with fluconazole  (Diflucan ) was initiated for possible yeast infection, and pyridium was prescribed for symptomatic relief of urinary discomfort. Final management will be guided by the results of culture and swab testing, and the patient will be contacted regarding additional treatment if indicated.  Today's evaluation has revealed no signs of a dangerous process. Discussed diagnosis with patient and/or guardian. Patient and/or guardian aware of their diagnosis, possible red flag symptoms to watch out for and need for close follow up. Patient and/or guardian understands verbal and written discharge instructions. Patient and/or guardian comfortable with plan and disposition.  Patient and/or guardian has a clear mental status at this time, good insight into illness (after discussion and teaching) and has clear judgment to make decisions regarding their care  Documentation was completed with the aid of voice recognition software. Transcription may contain typographical errors.  Final Clinical Impressions(s) / UC Diagnoses   Final diagnoses:  Dysuria  Vaginal itching  Urinary frequency     Discharge Instructions      You were seen today for frequent urination, discomfort with urination, and vaginal itching. Your urine test in the clinic did not show signs of infection, but we are sending your urine to the lab for culture to be sure. A vaginal swab was also collected to check for yeast infection, bacterial vaginosis, and sexually transmitted infections (STIs) such as gonorrhea, chlamydia, and trichomoniasis. Because your  symptoms could be from a yeast infection, you were given a dose of Diflucan  (fluconazole ). This medicine should help if yeast is the cause. You were also prescribed Pyridium, which can help relieve the discomfort when you urinate. Be aware that this medication  may turn your urine a bright orange color--this is normal and not harmful. Please wait for the test results, which will guide whether any additional treatment is needed. We will contact you if your results show that you need further medication or follow-up. In the meantime, drink plenty of water, avoid alcohol and caffeine, and wear loose-fitting cotton underwear to reduce irritation. Avoid using scented soaps, sprays, or douches in the vaginal area. Return to the clinic or seek medical attention sooner if your symptoms get worse, if you develop fever, abdominal pain, new or foul-smelling discharge, or any other concerning changes. Otherwise, follow up as directed once results are available.     ED Prescriptions     Medication Sig Dispense Auth. Provider   fluconazole  (DIFLUCAN ) 150 MG tablet Take 1 tablet (150 mg total) by mouth every 3 (three) days for 2 doses. 2 tablet Kelleigh Skerritt, FNP   phenazopyridine (PYRIDIUM) 200 MG tablet Take 1 tablet (200 mg total) by mouth 3 (three) times daily at 8am, 3pm and bedtime for 2 days. 6 tablet Iola Lukes, FNP      PDMP not reviewed this encounter.   Iola [place], [place] 07/31/24 [phone]

## 2024-07-30 NOTE — Discharge Instructions (Addendum)
 You were seen today for frequent urination, discomfort with urination, and vaginal itching. Your urine test in the clinic did not show signs of infection, but we are sending your urine to the lab for culture to be sure. A vaginal swab was also collected to check for yeast infection, bacterial vaginosis, and sexually transmitted infections (STIs) such as gonorrhea, chlamydia, and trichomoniasis. Because your symptoms could be from a yeast infection, you were given a dose of Diflucan  (fluconazole ). This medicine should help if yeast is the cause. You were also prescribed Pyridium, which can help relieve the discomfort when you urinate. Be aware that this medication may turn your urine a bright orange color--this is normal and not harmful. Please wait for the test results, which will guide whether any additional treatment is needed. We will contact you if your results show that you need further medication or follow-up. In the meantime, drink plenty of water, avoid alcohol and caffeine, and wear loose-fitting cotton underwear to reduce irritation. Avoid using scented soaps, sprays, or douches in the vaginal area. Return to the clinic or seek medical attention sooner if your symptoms get worse, if you develop fever, abdominal pain, new or foul-smelling discharge, or any other concerning changes. Otherwise, follow up as directed once results are available.

## 2024-07-31 ENCOUNTER — Ambulatory Visit: Payer: Self-pay | Admitting: Nurse Practitioner

## 2024-07-31 LAB — CERVICOVAGINAL ANCILLARY ONLY
Bacterial Vaginitis (gardnerella): NEGATIVE
Candida Glabrata: NEGATIVE
Candida Vaginitis: NEGATIVE
Chlamydia: NEGATIVE
Comment: NEGATIVE
Comment: NEGATIVE
Comment: NEGATIVE
Comment: NEGATIVE
Comment: NEGATIVE
Comment: NORMAL
Neisseria Gonorrhea: NEGATIVE
Trichomonas: NEGATIVE

## 2024-07-31 LAB — URINE CULTURE

## 2024-08-02 ENCOUNTER — Ambulatory Visit (HOSPITAL_COMMUNITY): Admitting: Licensed Clinical Social Worker

## 2024-08-02 ENCOUNTER — Encounter (HOSPITAL_COMMUNITY): Payer: Self-pay | Admitting: Licensed Clinical Social Worker

## 2024-08-02 DIAGNOSIS — F4321 Adjustment disorder with depressed mood: Secondary | ICD-10-CM | POA: Diagnosis not present

## 2024-08-02 DIAGNOSIS — F4323 Adjustment disorder with mixed anxiety and depressed mood: Secondary | ICD-10-CM | POA: Diagnosis not present

## 2024-08-02 NOTE — Progress Notes (Unsigned)
 Comprehensive Clinical Assessment (CCA) Note  08/02/2024 AJANI SCHNIEDERS [id number]  Chief Complaint:  Chief Complaint  Patient presents with   Adjustment Disorder   Depression   Anxiety   Visit Diagnosis:  Encounter Diagnoses  Name Primary?   Adjustment disorder with mixed anxiety and depressed mood Yes   Grief    Summary: Bryann is a 43yo, AA female, presenting for intake CCA to establish therapy for support in the management of worsening anxious and depressive sxs over the past 6-8 months, referred by PCP Greig Drones, NP. Pt reports increased challenges in providing support for 43yo son in navigating the loss of 3 paternal family members since February 2025, specifically his father, grandmother, and aunt, and the overall management of her own sxs related to grief. Pt details experiencing impairments in daily functioning across settings due to presenting stressors and sxs. Pt reports sxs including difficulties sleeping, difficulties focusing and completing tasks, over-thinking, racing thoughts, increased worrying, isolative, frequent tearfulness, fatigue, irritability, and anhedonia. Pt denies SI, HI, AVH. Pt denies substance use concerns. Pt denies prior OPT or INPT admissions. Pt will benefit from continued engagement in OPT services for support in the management of presenting sxs.   DIAGNOSTIC CRITERIA FOR Adjustment Disorder (DSM-5-TR):  [name]  presents with emotional or behavioral symptoms in response to an identifiable stressor occurring within 3 months of onset, following the loss of 3 family members within the past 5-8 months.  Identifiable Stressor: Type: []  Relationship difficulties [] Job-related issues [] Financial hardship []  Medical illness/event []  Academic problems [x] Bereavement (not normal) [] Other Details: Pt reports of 43yo son having lost multiple paternal family members within the past 5-8 months to include his father, grandmother and aunt, finding  increased difficulties supporting son in processing challenges, feeling self experiencing increased challenges navigating these losses due to the close natured relationships pt had with son's father's family.  2.Timing: []  No  [x]  Yes Symptoms began within 3 months of the stressor onset.  3. Clinical Significance: Symptoms cause clinically significant distress, evidenced by at least one of the following: []  No  [x]  Yes  Marked distress out of proportion to the stressor's severity, considering context.  4. Exclusionary Criteria: [x]  Disturbance does not meet criteria for another mental disorder (e.g., Major Depressive Disorder, Generalized Anxiety Disorder).  5. Specifiers (select predominant symptoms):  Type: []  With depressed mood:  low mood, tearfulness, or hopelessness. [x]  With mixed anxiety and depressed mood: both depressive and anxious symptoms. []  With disturbance of conduct: violation of others' rights or societal norms. []  Unspecified: Maladaptive reaction not meeting criteria for specific subtypes.      08/02/2024    8:13 AM 06/02/2024    3:42 PM 04/30/2023    3:03 PM 12/03/2020   11:06 AM  GAD 7 : Generalized Anxiety Score  Nervous, Anxious, on Edge 3 3 0 2  Control/stop worrying 2 3 1 2   Worry too much - different things 2 3 1 2   Trouble relaxing 2 3 1 2   Restless 1 3 0 2  Easily annoyed or irritable 1 1 1 1   Afraid - awful might happen 1 3 0 1  Total GAD 7 Score 12 19 4 12   Anxiety Difficulty Not difficult at all Extremely difficult Somewhat difficult       08/02/2024    8:14 AM 06/02/2024    3:42 PM 07/21/2023    3:47 PM 04/30/2023    3:02 PM 09/11/2022    2:41 PM  Depression screen PHQ 2/9  Decreased Interest 1 0 0 1 0  Down, Depressed, Hopeless 3 0 0 0 0  PHQ - 2 Score 4 0 0 1 0  Altered sleeping 3   2   Tired, decreased energy 1   2   Change in appetite 0   0   Feeling bad or failure about yourself  0   0   Trouble concentrating 3   0   Moving slowly or  fidgety/restless 1   0   Suicidal thoughts 0   0   PHQ-9 Score 12   5   Difficult doing work/chores Not difficult at all   Somewhat difficult    CCA Biopsychosocial Intake/Chief Complaint:  The anxiety, I don't think I'm depressed, I'm sad all the time.  Current Symptoms/Problems: It's hard to go to sleep, hard to focus, overthink alot, racing thoughts, worrying, sleep disturbances, get maybe 4-5hr sleep, isolate, tearfulness   Patient Reported Schizophrenia/Schizoaffective Diagnosis in Past: No   Strengths: I don't know. I just work, take care of my kids, and come home  Preferences: Gain some sense of direction to help me cope with what's going on and help my son  Abilities: Open to feedback and support.   Type of Services Patient Feels are Needed: Just therapy to learn how to cope with these deaths   Initial Clinical Notes/Concerns: Pt is a 43yo, AA female, referred by PCP for intake assessment to establish care for support in managing presenting sxs. No prior hx of OPT or INPT. PCP currently managing Hydroxyzine .   Mental Health Symptoms Depression:  Change in energy/activity; Difficulty Concentrating; Fatigue; Sleep (too much or little); Tearfulness   Duration of Depressive symptoms: Greater than two weeks   Mania:  N/A   Anxiety:   Difficulty concentrating; Fatigue; Irritability; Restlessness; Sleep; Worrying   Psychosis:  None   Duration of Psychotic symptoms: No data recorded  Trauma:  Emotional numbing; Difficulty staying/falling asleep; Avoids reminders of event   Obsessions:  None   Compulsions:  None   Inattention:  None   Hyperactivity/Impulsivity:  None   Oppositional/Defiant Behaviors:  None   Emotional Irregularity:  None   Other Mood/Personality Symptoms:  No data recorded   Mental Status Exam Appearance and self-care  Stature:  Average   Weight:  Average weight   Clothing:  Casual   Grooming:  Normal   Cosmetic use:  No data  recorded  Posture/gait:  Normal   Motor activity:  Not Remarkable   Sensorium  Attention:  Normal   Concentration:  Normal   Orientation:  X5   Recall/memory:  Normal   Affect and Mood  Affect:  Congruent; Appropriate; Depressed   Mood:  Depressed; Anxious   Relating  Eye contact:  Normal   Facial expression:  Depressed   Attitude toward examiner:  Cooperative   Thought and Language  Speech flow: Clear and Coherent   Thought content:  Appropriate to Mood and Circumstances   Preoccupation:  None   Hallucinations:  None   Organization:  No data recorded  [employer] of Knowledge:  Average   Intelligence:  Average   Abstraction:  Normal   Judgement:  Good   Reality Testing:  Adequate   Insight:  Good   Decision Making:  Normal   Social Functioning  Social Maturity:  Isolates; Responsible   Social Judgement:  Normal   Stress  Stressors:  Grief/losses   Coping Ability:  Deficient supports; Overwhelmed   Skill Deficits:  Interpersonal; Communication   Supports:  No data recorded    Religion: Religion/Spirituality Are You A Religious Person?: No  Leisure/Recreation: Leisure / Recreation Do You Have Hobbies?: No  Exercise/Diet: Exercise/Diet Do You Exercise?: No Have You Gained or Lost A Significant Amount of Weight in the Past Six Months?: No Do You Follow a Special Diet?: No Do You Have Any Trouble Sleeping?: Yes Explanation of Sleeping Difficulties: Difficulties falling asleep, staying asleep. Avg. 4-5hr/night.   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 Work helps me deal with and keep my mind off things Patient's Job has Been Impacted by Current  Illness: No What is the Longest Time Patient has Held a Job?: Current Where was the Patient Employed at that Time?: Current Has Patient ever Been in the [employer]?: No  Education: Education Is Patient Currently Attending School?: No Last Grade Completed: 10 Did Garment/textile technologist From [employer]?: No Did You Product manager?: No Did You Attend Graduate School?: No Did You Have An Individualized Education Program (IIEP): No Did You Have Any Difficulty At School?: No Patient's Education Has Been Impacted by Current Illness: No   CCA Family/Childhood History Family and Relationship History: Family history Marital status: Single Are you sexually active?: Yes What is your sexual orientation?: Heterosexual Has your sexual activity been affected by drugs, alcohol, medication, or emotional stress?: None. Does patient have children?: Yes How many children?: 3 (27 and 22yo daughters, 43yo son) How is patient's relationship with their children?: Close with all three of them  Childhood History:  Childhood History By whom was/is the patient raised?: Grandparents, Father, Mother Additional childhood history information: I was raised by my grandma but she passed away when I was 43, then I was bounced around between my mom and dad. I basically raised myself Description of patient's relationship with caregiver when they were a child: I had an excellent relationship with my grandma until she passed, my dad I saw occaisionally, mom was more distant and put a man before her kids Patient's description of current relationship with people who raised him/her: Closer with mom and dad now, they come around but still not as close as I was with son's family How were you disciplined when you got in trouble as a child/adolescent?: I didn't ever get disciplined that I can remember Does patient have siblings?: Yes Number of Siblings: 5 (35yo paternal half sister, 39yo maternal half sister, 37yo maternal half  brother, 42yo brother, 44yo sister.) Description of patient's current relationship with siblings: I can't be really be around any of them because they just took after our momma and drink and smoke their problems away Did patient suffer any verbal/emotional/physical/sexual abuse as a child?: No Did patient suffer from severe childhood neglect?: No Has patient ever been sexually abused/assaulted/raped as an adolescent or adult?: No Was the patient ever a victim of a crime or a disaster?: No Witnessed domestic violence?: No Has patient been affected by domestic violence as an adult?: No  Child/Adolescent Assessment:     CCA Substance Use Alcohol/Drug Use: Alcohol / Drug Use Pain Medications: None Prescriptions: See MAR Over the Counter: None. History of alcohol / drug use?: No history of alcohol / drug abuse   ASAM's:  Six Dimensions of Multidimensional Assessment  Dimension 1:  Acute Intoxication and/or Withdrawal Potential:      Dimension 2:  Biomedical Conditions and Complications:      Dimension 3:  Emotional, Behavioral, or Cognitive Conditions and Complications:     Dimension 4:  Readiness to Change:     Dimension 5:  Relapse, Continued use, or Continued Problem Potential:     Dimension 6:  Recovery/Living Environment:     ASAM Severity Score:    ASAM Recommended Level of Treatment:     Substance use Disorder (SUD)    Recommendations for Services/Supports/Treatments: Recommendations for Services/Supports/Treatments Recommendations For Services/Supports/Treatments: Individual Therapy  DSM5 Diagnoses: Patient Active Problem List   Diagnosis Date Noted   Adjustment disorder with mixed anxiety and depressed mood 08/02/2024   Grief 08/02/2024   Radiculopathy, cervical region 11/24/2022   Lipoma of forearm 07/24/2021   Insomnia 06/11/2021   Acute right-sided low back pain with right-sided sciatica 12/04/2020   Anemia 01/29/2020    Patient Centered Plan: Patient is  on the following Treatment Plan(s): Unable to develop tx plan due to time constraints. Will develop plan to support in mgmt of Anxiety and Depression at next scheduled visit.   Referrals to Alternative Service(s): Referred to Alternative Service(s):   Place:   Date:   Time:    Referred to Alternative Service(s):   Place:   Date:   Time:    Referred to Alternative Service(s):   Place:   Date:   Time:    Referred to Alternative Service(s):   Place:   Date:   Time:      Collaboration of Care: Primary Care Provider AEB Referring provider. Documentation available in EHR.  Patient/Guardian was advised Release of Information must be obtained prior to any record release in order to collaborate their care with an outside provider. Patient/Guardian was advised if they have not already done so to contact the registration department to sign all necessary forms in order for us  to release information regarding their care.   Consent: Patient/Guardian gives verbal consent for treatment and assignment of benefits for services provided during this visit. Patient/Guardian expressed understanding and agreed to proceed.   Lynwood JONETTA Maris, LCSW

## 2024-08-24 ENCOUNTER — Ambulatory Visit (INDEPENDENT_AMBULATORY_CARE_PROVIDER_SITE_OTHER): Admitting: Licensed Clinical Social Worker

## 2024-08-24 ENCOUNTER — Encounter (HOSPITAL_COMMUNITY): Payer: Self-pay

## 2024-08-24 DIAGNOSIS — F4321 Adjustment disorder with depressed mood: Secondary | ICD-10-CM

## 2024-08-24 DIAGNOSIS — F4323 Adjustment disorder with mixed anxiety and depressed mood: Secondary | ICD-10-CM | POA: Diagnosis not present

## 2024-08-24 NOTE — Progress Notes (Signed)
 THERAPIST PROGRESS NOTE   Session Date: 08/24/2024  Session Time: 1608 - 1705  Participation Level: Active  MSE/Presentation: Behavior: Appropriate and Sharing Speech: Normal Thought Process: Coherent and Relevant Cognition: Alert and Appropriate Mood: Anxious and Euthymic Affect: Congruent Insight: Improving Appearance: Neat and Well Groomed  Type of Therapy: Individual Therapy  Treatment Goals addressed:   Initial (7) STG: Paylin will reduce frequency of avoidant behaviors by 50% as evidenced by self-report in therapy sessions (OP Depression) LTG: Get back to the person I used to be, not being so sad, not being so anxious. (Anxiety) LTG: Reduce frequency, intensity, and duration of depression symptoms so that daily functioning is improved (OP Depression) LTG: Increase coping skills to manage depression and improve ability to perform daily activities (OP Depression) STG: Reduce overall depression score by a minimum of 25% on the Patient Health Questionnaire (PHQ-9) (OP Depression) STG: Journei will identify cognitive patterns and beliefs that support depression (OP Depression)   Progress Towards Goals: Initial  Interventions: CBT, Motivational Interviewing, Solution Focused, and Supportive  Summary: JESSELLE LAFLAMME is a(n) 43 y.o. female, with psych hx consistent with Adjustment disorder with mixed anxiety and depressed mood, and grief, presenting for follow-up therapy session in efforts to improve management of symptoms.   Patient actively engaged in session, presenting in primarily pleasant moods with brief periods of increased anxiousness, and congruent affect throughout duration of visit. Patient openly engaged in introductory check-in, sharing of things going well overall over the past 3 weeks, sharing of feeling things to have improved since initial visit, specifically detailing of having observed a reduction in experienced depressive sxs, finding self to not be  crying daily as she had been doing so previously, isolating less, and feeling to be less irritable. Pt actively engaged in reassessing presenting sxs via PHQ-9 and GAD-7, processing significant reductions in screening scores, and noting of variances in observed sxs, further noting of finding self experiencing ongoing sleep difficulties with falling and staying asleep. Pt additionally shared of having cut out sodas in the past 3 weeks, having drank approx. 2 sodas in total, having observed a reduction in anxiousness with drinking less sodas. Actively engaged in review of initial visit in further exploration of areas of concern in aims of identifying individualized goals to be included in finalizing of tx plan.     08/24/2024    4:11 PM 08/02/2024    8:13 AM 06/02/2024    3:42 PM 04/30/2023    3:03 PM  GAD 7 : Generalized Anxiety Score  Nervous, Anxious, on Edge 1 3 3  0  Control/stop worrying 3 2 3 1   Worry too much - different things 1 2 3 1   Trouble relaxing 1 2 3 1   Restless 0 1 3 0  Easily annoyed or irritable 0 1 1 1   Afraid - awful might happen 1 1 3  0  Total GAD 7 Score 7 12 19 4   Anxiety Difficulty Not difficult at all Not difficult at all Extremely difficult Somewhat difficult      08/24/2024    4:14 PM 08/02/2024    8:14 AM 06/02/2024    3:42 PM 07/21/2023    3:47 PM 04/30/2023    3:02 PM  Depression screen PHQ 2/9  Decreased Interest 0 1 0 0 1  Down, Depressed, Hopeless 0 3 0 0 0  PHQ - 2 Score 0 4 0 0 1  Altered sleeping 1 3   2   Tired, decreased energy 3 1   2  Change in appetite 0 0   0  Feeling bad or failure about yourself  0 0   0  Trouble concentrating 1 3   0  Moving slowly or fidgety/restless 0 1   0  Suicidal thoughts 0 0   0  PHQ-9 Score 5 12   5   Difficult doing work/chores Not difficult at all Not difficult at all   Somewhat difficult   Flowsheet Row UC from 07/30/2024 in [facility] UC from 05/15/2024 in [facility] at [facility]) UC from 01/21/2024 in [facility] at [facility])  C-SSRS RISK CATEGORY No Risk No Risk No Risk    Suicidal/Homicidal: None, No plan to harm self or others  Therapist Response:  Clinician actively greeted pt upon presenting for visit, assessing presenting moods and affect, engaging in check-in, acknowledging presenting moods.  Utilized open ended questions in eliciting recounts of events of the past 3 weeks, exploring newly identified stressors, ongoing stressors, and individual efforts at navigating challenges. Actively listened to pt's recounts of events, providing support and validation of identified challenges, and thoughts and feelings experienced surrounding stressors. Engage patient in review of symptoms experienced over recent weeks via PHQ-9 and GAD-7, engaging further in reflection of screening scores and observations of decreased symptoms. Engaged in review of of areas of concern noted in initial visit in efforts of identifying individualized treatment goals. Utilized CBT, MI, solution focused, and psychoed techniques to aid pt in processing stressors. Clinician reassessed severity of presenting sxs, and presence of any safety concerns.  []  Cognitive Challenging [x]  Cognitive Refocusing []  Cognitive Reframing  []  Communication Skills []  Compliance Issues []  DBT []  Exploration of Coping Patterns [x]  Exploration of Emotions []  Exploration of Relationship Patterns []  Guided Imagery []  Interactive Feedback []  Interpersonal Resolutions []  Mindfulness Training []  Preventative Services [x]  Psycho-Education  []  Relaxation/Deep Breathing [x]  Review of Treatment Plan/Progress []  Role-Play/Behavioral Rehearsal  []  Structured Problem Solving [x]  Supportive Reflection [x]  Symptom Management  []  Other   Patient responded well to interventions. Patient continues to meet criteria for Adjustment disorder with mixed anxiety and depressed  mood, and Grief. Patient will continue to benefit from engagement in outpatient therapy due to being the least restrictive service to meet presenting needs.    Homework: None.  Plan: Return again in 3 weeks.  Diagnosis:  Encounter Diagnoses  Name Primary?   Adjustment disorder with mixed anxiety and depressed mood Yes   Grief     Collaboration of Care: Other none necessary at this time.  Patient/Guardian was advised Release of Information must be obtained prior to any record release in order to collaborate their care with an outside provider. Patient/Guardian was advised if they have not already done so to contact the registration department to sign all necessary forms in order for us  to release information regarding their care.   Consent: Patient/Guardian gives verbal consent for treatment and assignment of benefits for services provided during this visit. Patient/Guardian expressed understanding and agreed to proceed.   Lynwood JONETTA Maris, MSW, LCSW 08/24/2024,  5:06 PM

## 2024-09-06 ENCOUNTER — Ambulatory Visit (HOSPITAL_COMMUNITY): Admitting: Licensed Clinical Social Worker

## 2024-09-06 DIAGNOSIS — F4323 Adjustment disorder with mixed anxiety and depressed mood: Secondary | ICD-10-CM

## 2024-09-06 DIAGNOSIS — F4321 Adjustment disorder with depressed mood: Secondary | ICD-10-CM | POA: Diagnosis not present

## 2024-09-06 NOTE — Progress Notes (Signed)
 THERAPIST PROGRESS NOTE   Session Date: 09/06/2024  Session Time: 1618 - 1657  Participation Level: Active  MSE/Presentation: Behavior: Appropriate and Sharing Speech: Normal Thought Process: Coherent and Relevant Cognition: Alert and Appropriate Mood: Anxious and Euthymic Affect: Congruent Insight: Improving Appearance: Neat and Well Groomed  Type of Therapy: Individual Therapy  Treatment Goals addressed:   Initial (7) STG: Favor will reduce frequency of avoidant behaviors by 50% as evidenced by self-report in therapy sessions (OP Depression) LTG: Get back to the person I used to be, not being so sad, not being so anxious. (Anxiety) LTG: Reduce frequency, intensity, and duration of depression symptoms so that daily functioning is improved (OP Depression) LTG: Increase coping skills to manage depression and improve ability to perform daily activities (OP Depression) STG: Reduce overall depression score by a minimum of 25% on the Patient Health Questionnaire (PHQ-9) (OP Depression) STG: Lekeshia will identify cognitive patterns and beliefs that support depression (OP Depression)   Progress Towards Goals: Progressing  Interventions: CBT, Motivational Interviewing, Solution Focused, and Supportive  Summary: [name] is a(n) 43 y.o. female, with psych hx consistent with Adjustment disorder with mixed anxiety and depressed mood, and grief, presenting for follow-up therapy session in efforts to improve management of symptoms.   Patient actively engaged in session, presenting in primarily pleasant moods with brief periods of increased anxiousness, and congruent affect throughout duration of visit. Patient openly engaged in introductory check-in, sharing of doing okay, sharing of things having continued to progress over recent weeks. Pt detailed increased stress over recent days due to experiencing worsening tooth ache, expressing need to connect with dentist in the coming  week. Pt shared additionally of stress surrounding son's recent 13th birthday, and having observed son experiencing increased difficulties navigating feelings and emotions surrounding the loss of father earlier this year. Pt actively engaged in exploring ways in which she can support pt in navigating feelings by simply being present and allowing son to feel vulnerable, as well as the benefits from allowing self to be vulnerable with son and older daughters in aims of navigating loss of son's father.  Pt actively engaged in reassessing presenting depressive and anxious sxs via PHQ-9 and GAD-7, exploring continued downward trend in sxs, processing continued difficulties in relation to sleep and sleep hygiene, acknowledging having attempted to discontinue use of phone and TV approx 45-67min prior to bed to aid in relaxation, however still experiencing challenges with sleep, attributing to recent emotional challenges with son's birthday and ongoing tooth ache.     09/06/2024    4:22 PM 08/24/2024    4:11 PM 08/02/2024    8:13 AM 06/02/2024    3:42 PM  GAD 7 : Generalized Anxiety Score  Nervous, Anxious, on Edge 1 1 3 3   Control/stop worrying 1 3 2 3   Worry too much - different things 0 1 2 3   Trouble relaxing 1 1 2 3   Restless 0 0 1 3  Easily annoyed or irritable 0 0 1 1  Afraid - awful might happen 0 1 1 3   Total GAD 7 Score 3 7 12 19   Anxiety Difficulty Not difficult at all Not difficult at all Not difficult at all Extremely difficult      09/06/2024    4:25 PM 08/24/2024    4:14 PM 08/02/2024    8:14 AM 06/02/2024    3:42 PM 07/21/2023    3:47 PM  Depression screen PHQ 2/9  Decreased Interest 0 0 1 0 0  Down, Depressed, Hopeless 0 0 3 0 0  PHQ - 2 Score 0 0 4 0 0  Altered sleeping 3 1 3     Tired, decreased energy 1 3 1     Change in appetite 0 0 0    Feeling bad or failure about yourself  0 0 0    Trouble concentrating 0 1 3    Moving slowly or fidgety/restless 0 0 1    Suicidal thoughts  0 0 0    PHQ-9 Score 4 5  12      Difficult doing work/chores Not difficult at all Not difficult at all Not difficult at all       Data saved with a previous flowsheet row definition   Flowsheet Row UC from 07/30/2024 in [facility]) UC from 05/15/2024 in [facility] at [facility]) UC from 01/21/2024 in [facility] at [facility])  C-SSRS RISK CATEGORY No Risk No Risk No Risk    Suicidal/Homicidal: None, No plan to harm self or others  Therapist Response:  Clinician actively greeted pt upon presenting for visit, assessing presenting moods and affect, engaging in check-in, acknowledging presenting moods.  Utilized open ended questions in eliciting recounts of events of the past 3 weeks, exploring newly observed or experienced stressors, ongoing challenges, and individual efforts at navigating difficulties. Actively listened to pt's recounts of events, providing support and validation of identified challenges, and thoughts and feelings experienced surrounding stressors. Further supporting pt in exploring potential adjustments to aid in supporting son in navigating feelings and emotions surrounding loss. Engage patient in review of symptoms experienced over recent weeks via PHQ-9 and GAD-7, further processing variances in scores and decreased symptoms. Utilized CBT, MI, solution focused, and psychoed techniques to aid pt in processing stressors. Clinician reassessed severity of presenting sxs, and presence of any safety concerns.  [x]  Cognitive Challenging [x]  Cognitive Refocusing []  Cognitive Reframing  []  Communication Skills []  Compliance Issues []  DBT [x]  Exploration of Coping Patterns [x]  Exploration of Emotions [x]  Exploration of Relationship Patterns []  Guided Imagery []  Interactive Feedback []  Interpersonal Resolutions []  Mindfulness Training []  Preventative Services [x]  Psycho-Education  []   Relaxation/Deep Breathing [x]  Review of Treatment Plan/Progress []  Role-Play/Behavioral Rehearsal  [x]  Structured Problem Solving [x]  Supportive Reflection [x]  Symptom Management  []  Other   Patient responded well to interventions. Patient continues to meet criteria for Adjustment disorder with mixed anxiety and depressed mood, and Grief. Patient will continue to benefit from engagement in outpatient therapy due to being the least restrictive service to meet presenting needs.    Homework: None.  Plan: Return again in 3 weeks.  Diagnosis:  Encounter Diagnoses  Name Primary?   Adjustment disorder with mixed anxiety and depressed mood Yes   Grief      Collaboration of Care: Other none necessary at this time.  Patient/Guardian was advised Release of Information must be obtained prior to any record release in order to collaborate their care with Perry outside provider. Patient/Guardian was advised if they have not already done so to contact the registration department to sign all necessary forms in order for us  to release information regarding their care.   Consent: Patient/Guardian gives verbal consent for treatment and assignment of benefits for services provided during this visit. Patient/Guardian expressed understanding and agreed to proceed.   [name], MSW, LCSW 09/06/2024,  4:27 PM

## 2024-09-20 ENCOUNTER — Ambulatory Visit (INDEPENDENT_AMBULATORY_CARE_PROVIDER_SITE_OTHER): Admitting: Licensed Clinical Social Worker

## 2024-09-20 DIAGNOSIS — F4323 Adjustment disorder with mixed anxiety and depressed mood: Secondary | ICD-10-CM | POA: Diagnosis not present

## 2024-09-20 NOTE — Progress Notes (Signed)
 THERAPIST PROGRESS NOTE   Session Date: 09/20/2024  Session Time: 1606 - 1658  Virtual Visit via Video Note  I connected with [name] on 09/20/24 at  4:00 PM EST by a video enabled telemedicine application and verified that I am speaking with the correct person using two identifiers.  Location: Patient: Home Provider: Home Office   I discussed the limitations of evaluation and management by telemedicine and the availability of in person appointments. The patient expressed understanding and agreed to proceed.   I discussed the assessment and treatment plan with the patient. The patient was provided an opportunity to ask questions and all were answered. The patient agreed with the plan and demonstrated an understanding of the instructions.   The patient was advised to call back or seek an in-person evaluation if the symptoms worsen or if the condition fails to improve as anticipated.  I provided 52 minutes of non-face-to-face time during this encounter.  Participation Level: Active  MSE/Presentation: Behavior: Appropriate and Sharing Speech: Normal Thought Process: Coherent and Relevant Cognition: Alert and Appropriate Mood: Euthymic Affect: Congruent Insight: Improving Appearance: Casual  Type of Therapy: Individual Therapy  Treatment Goals addressed:   Initial (7) STG: [name] will reduce frequency of avoidant behaviors by 50% as evidenced by self-report in therapy sessions (OP Depression) LTG: Get back to the person I used to be, not being so sad, not being so anxious. (Anxiety) LTG: Reduce frequency, intensity, and duration of depression symptoms so that daily functioning is improved (OP Depression) LTG: Increase coping skills to manage depression and improve ability to perform daily activities (OP Depression) STG: Reduce overall depression score by a minimum of 25% on the Patient Health Questionnaire (PHQ-9) (OP Depression) STG: [name] will identify  cognitive patterns and beliefs that support depression (OP Depression)   Progress Towards Goals: Progressing  Interventions: CBT, Motivational Interviewing, Solution Focused, and Supportive  Summary: [name] is a(n) 43 y.o. female, with psych hx consistent with Adjustment disorder with mixed anxiety and depressed mood, and grief, presenting for follow-up therapy session in efforts to improve management of symptoms.   Patient actively engaged in session, presenting in pleasant moods and congruent affect throughout duration of visit.   Patient openly participated in the introductory check-in, reporting that she has been doing generally well. She shared that she was able to leave work early this afternoon due to the upcoming Thanksgiving holiday and noted her plans to begin preparing larger food items this evening to reduce stress on the holiday itself.  Patient actively engaged in reassessment of depressive and anxious symptoms using the PHQ-9 and GAD-7. Explored the maintained decreases in both depression and anxiety scores, with the patient noticing a minimal increase in anxiety that she attributed to the approaching holidays and recent difficulties with sleep. Patient reported averaging 5-5.5 hours of sleep per night, frequently waking during the night and experiencing difficulty returning to sleep. She noted achieving approximately 7 hours of sleep only 1-2 nights per week.  The session included exploration of ongoing efforts to improve sleep hygiene, including reducing evening electronic device use, monitoring diet and fluid intake before bed, and maintaining consistent routines. Discussed factors contributing to nighttime awakenings and challenges in returning to sleep. Explored potential benefit of consulting with her primary care provider (PCP) to assess or rule out any underlying medical contributors to disrupted sleep, such as sleep apnea or other possible conditions.      09/20/2024    4:12 PM 09/06/2024  4:22 PM 08/24/2024    4:11 PM 08/02/2024    8:13 AM  GAD 7 : Generalized Anxiety Score  Nervous, Anxious, on Edge 0 1 1 3   Control/stop worrying 1 1 3 2   Worry too much - different things 1 0 1 2  Trouble relaxing 1 1 1 2   Restless 1 0 0 1  Easily annoyed or irritable 0 0 0 1  Afraid - awful might happen 0 0 1 1  Total GAD 7 Score 4 3 7 12   Anxiety Difficulty Not difficult at all Not difficult at all Not difficult at all Not difficult at all      09/20/2024    4:15 PM 09/06/2024    4:25 PM 08/24/2024    4:14 PM 08/02/2024    8:14 AM 06/02/2024    3:42 PM  Depression screen PHQ 2/9  Decreased Interest 0 0 0 1 0  Down, Depressed, Hopeless 0 0 0 3 0  PHQ - 2 Score 0 0 0 4 0  Altered sleeping 1 3 1 3    Tired, decreased energy 1 1 3 1    Change in appetite 0 0 0 0   Feeling bad or failure about yourself  0 0 0 0   Trouble concentrating 0 0 1 3   Moving slowly or fidgety/restless 0 0 0 1   Suicidal thoughts 0 0 0 0   PHQ-9 Score 2 4 5  12     Difficult doing work/chores Not difficult at all Not difficult at all Not difficult at all Not difficult at all      Data saved with a previous flowsheet row definition   Flowsheet Row UC from 07/30/2024 in [facility]) UC from 05/15/2024 in [facility] UC from 01/21/2024 in [facility])  C-SSRS RISK CATEGORY No Risk No Risk No Risk    Suicidal/Homicidal: None, No plan to harm self or others  Therapist Response:  Clinician actively greeted pt upon presenting for visit, assessing presenting moods and affect, engaging in check-in, acknowledging presenting moods.  Utilized open ended questions in eliciting recounts of events of the past 3 weeks, exploring newly observed or experienced stressors, ongoing challenges, and individual efforts at navigating difficulties. Actively listened to  pt's recounts of events, providing support and validation of identified challenges, and thoughts and feelings experienced surrounding stressors.  Engage patient in review of symptoms experienced over recent weeks via PHQ-9 and GAD-7, further processing variances in scores and decreased symptoms. CBT, Motivational Interviewing, solution-focused strategies, and supportive reflection were utilized to assist the patient in exploring presenting challenges, reinforcing progress, and identifying strategies for continued symptom management. Clinician reassessed severity of presenting sxs, and presence of any safety concerns.  [x]  Cognitive Challenging [x]  Cognitive Refocusing []  Cognitive Reframing  []  Communication Skills []  Compliance Issues []  DBT [x]  Exploration of Coping Patterns [x]  Exploration of Emotions [x]  Exploration of Relationship Patterns []  Guided Imagery []  Interactive Feedback []  Interpersonal Resolutions []  Mindfulness Training []  Preventative Services [x]  Psycho-Education  []  Relaxation/Deep Breathing [x]  Review of Treatment Plan/Progress []  Role-Play/Behavioral Rehearsal  [x]  Structured Problem Solving [x]  Supportive Reflection [x]  Symptom Management  []  Other   Patient responded well to interventions. Patient continues to meet criteria for Adjustment disorder with mixed anxiety and depressed mood, and Grief. Patient will continue to benefit from engagement in outpatient therapy due to being the least restrictive service to meet presenting  needs.    Homework: Consider downloading sleep tracking app to support monitoring sleep, explore scheduling appt with PCP in order to R/O any medical concerns/factors impacting sleep.  Plan: Return again in 2 weeks.  Diagnosis:  Encounter Diagnosis  Name Primary?   Adjustment disorder with mixed anxiety and depressed mood Yes   Collaboration of Care: Other none necessary at this time.  Patient/Guardian was advised Release of Information  must be obtained prior to any record release in order to collaborate their care with an outside provider. Patient/Guardian was advised if they have not already done so to contact the registration department to sign all necessary forms in order for us  to release information regarding their care.   Consent: Patient/Guardian gives verbal consent for treatment and assignment of benefits for services provided during this visit. Patient/Guardian expressed understanding and agreed to proceed.   Lynwood JONETTA Maris, MSW, LCSW 09/20/2024,  4:20 PM

## 2024-10-01 ENCOUNTER — Other Ambulatory Visit: Payer: Self-pay

## 2024-10-01 ENCOUNTER — Inpatient Hospital Stay
Admission: RE | Admit: 2024-10-01 | Discharge: 2024-10-01 | Payer: Self-pay | Attending: Internal Medicine | Admitting: Internal Medicine

## 2024-10-01 VITALS — BP 102/70 | HR 95 | Temp 98.1°F | Resp 18

## 2024-10-01 DIAGNOSIS — R350 Frequency of micturition: Secondary | ICD-10-CM

## 2024-10-01 DIAGNOSIS — N3001 Acute cystitis with hematuria: Secondary | ICD-10-CM

## 2024-10-01 DIAGNOSIS — M545 Low back pain, unspecified: Secondary | ICD-10-CM

## 2024-10-01 LAB — POCT URINE DIPSTICK
Bilirubin, UA: NEGATIVE
Glucose, UA: NEGATIVE mg/dL
Ketones, POC UA: NEGATIVE mg/dL
Leukocytes, UA: NEGATIVE
Nitrite, UA: POSITIVE — AB
POC PROTEIN,UA: 30 — AB
Spec Grav, UA: 1.025 (ref 1.010–1.025)
Urobilinogen, UA: 1 U/dL
pH, UA: 6 (ref 5.0–8.0)

## 2024-10-01 LAB — POCT URINE PREGNANCY: Preg Test, Ur: NEGATIVE

## 2024-10-01 MED ORDER — KETOROLAC TROMETHAMINE 30 MG/ML IJ SOLN
30.0000 mg | Freq: Once | INTRAMUSCULAR | Status: AC
  Administered 2024-10-01: 30 mg via INTRAMUSCULAR

## 2024-10-01 MED ORDER — CYCLOBENZAPRINE HCL 5 MG PO TABS: 5.0000 mg | ORAL_TABLET | Freq: Three times a day (TID) | ORAL | 0 refills | Status: AC | PRN

## 2024-10-01 MED ORDER — NITROFURANTOIN MONOHYD MACRO 100 MG PO CAPS: 100.0000 mg | ORAL_CAPSULE | Freq: Two times a day (BID) | ORAL | 0 refills | Status: AC

## 2024-10-01 NOTE — ED Triage Notes (Signed)
 Pt here for lower back pain x 3-4 days with some urinary frequency and odor

## 2024-10-01 NOTE — ED Provider Notes (Signed)
 [name]    CSN: [id number] Arrival date & time: 10/01/24  0904      History   Chief Complaint Chief Complaint  Patient presents with   Generalized Body Aches    Lower back pain, strong urine odor. - Entered by patient    HPI [name] is a 43 y.o. female.   43 year old female who presents urgent care with complaints of urinary frequency, odor to the urine and lower back pain.  She reports that symptoms started about 3 to 4 days ago.  She thought it was getting better yesterday but then worsened today.  The lower back pain can be severe at times and is making it difficult to walk.  She denies any fevers, chills, bladder or bowel incontinence, abdominal pain, nausea or vomiting.  She has been using over-the-counter Azo without relief.     Past Medical History:  Diagnosis Date   Abnormal Pap smear    Anemia    BV (bacterial vaginosis) 06/24/2022   Candida vaginitis 05/06/2022   Chlamydia    Gonorrhea    Headache(784.0)    Herpes simplex type 1 infection    Herpes simplex type 2 infection    HPV (human papilloma virus) infection    HSV (herpes simplex virus) infection 03/11/2022   Trichomonas    Urinary tract infection     Patient Active Problem List   Diagnosis Date Noted   Adjustment disorder with mixed anxiety and depressed mood 08/02/2024   Grief 08/02/2024   Radiculopathy, cervical region 11/24/2022   Lipoma of forearm 07/24/2021   Insomnia 06/11/2021   Acute right-sided low back pain with right-sided sciatica 12/04/2020   Anemia 01/29/2020    Past Surgical History:  Procedure Laterality Date   CESAREAN SECTION  08/31/2011   Procedure: CESAREAN SECTION;  Surgeon: Olam DELENA Mill, MD;  Location: [facility];  Service: Gynecology;  Laterality: N/A;  Primary Cesarean section Baby Boy @ 1418   COLPOSCOPY  2010   LEEP     LIPOMA EXCISION Right 08/08/2021   Procedure: RIGHT FOREARM MASS EXCISION;  Surgeon: Romona Harari, MD;   Location: [facility];  Service: Orthopedics;  Laterality: Right;    OB History     Gravida  3   Para  3   Term  2   Preterm  1   AB      Living  3      SAB      IAB      Ectopic      Multiple      Live Births  3            Home Medications    Prior to Admission medications   Medication Sig Start Date End Date Taking? Authorizing Provider  cyclobenzaprine  (FLEXERIL ) 5 MG tablet Take 1 tablet (5 mg total) by mouth every 8 (eight) hours as needed for muscle spasms. 10/01/24  Yes Oluwadara Gorman A, PA-C  nitrofurantoin , macrocrystal-monohydrate, (MACROBID ) 100 MG capsule Take 1 capsule (100 mg total) by mouth 2 (two) times daily. 10/01/24  Yes Tereka Thorley A, PA-C  hydrOXYzine  (VISTARIL ) 25 MG capsule Take 1 capsule (25 mg total) by mouth 3 (three) times daily as needed. 06/02/24   Jaycee Greig PARAS, NP    Family History Family History  Problem Relation Age of Onset   Hypertension Mother    Diabetes Mother    Stroke Mother    Heart disease Brother    Heart attack Brother  30    Social History Social History   Tobacco Use   Smoking status: Former    Current packs/day: 0.25    Average packs/day: 0.3 packs/day for 5.0 years (1.3 ttl pk-yrs)    Types: Cigarettes    Passive exposure: Past   Smokeless tobacco: Never   Tobacco comments:    quit with + preg  Vaping Use   Vaping status: Never Used  Substance Use Topics   Alcohol use: Not Currently    Alcohol/week: 1.0 standard drink of alcohol    Types: 1 Standard drinks or equivalent per week    Comment: rare   Drug use: No     Allergies   Patient has no known allergies.   Review of Systems Review of Systems  Constitutional:  Negative for chills and fever.  HENT:  Negative for ear pain and sore throat.   Eyes:  Negative for pain and visual disturbance.  Respiratory:  Negative for cough and shortness of breath.   Cardiovascular:  Negative for chest pain and palpitations.   Gastrointestinal:  Negative for abdominal pain and vomiting.  Genitourinary:  Positive for frequency. Negative for dysuria and hematuria.       Odor to urine  Musculoskeletal:  Positive for back pain (Low back). Negative for arthralgias.  Skin:  Negative for color change and rash.  Neurological:  Negative for seizures and syncope.  All other systems reviewed and are negative.    Physical Exam Triage Vital Signs ED Triage Vitals [10/01/24 0959]  Encounter Vitals Group     BP 102/70     Girls Systolic BP Percentile      Girls Diastolic BP Percentile      Boys Systolic BP Percentile      Boys Diastolic BP Percentile      Pulse Rate 95     Resp 18     Temp 98.1 F (36.7 C)     Temp Source Oral     SpO2 96 %     Weight      Height      Head Circumference      Peak Flow      Pain Score 8     Pain Loc      Pain Education      Exclude from Growth Chart    No data found.  Updated Vital Signs BP 102/70 (BP Location: Left Arm)   Pulse 95   Temp 98.1 F (36.7 C) (Oral)   Resp 18   SpO2 96%   Visual Acuity Right Eye Distance:   Left Eye Distance:   Bilateral Distance:    Right Eye Near:   Left Eye Near:    Bilateral Near:     Physical Exam Vitals and nursing note reviewed.  Constitutional:      General: She is not in acute distress.    Appearance: She is well-developed.  HENT:     Head: Normocephalic and atraumatic.  Eyes:     Conjunctiva/sclera: Conjunctivae normal.  Cardiovascular:     Rate and Rhythm: Normal rate and regular rhythm.     Heart sounds: No murmur heard. Pulmonary:     Effort: Pulmonary effort is normal. No respiratory distress.     Breath sounds: Normal breath sounds.  Abdominal:     Palpations: Abdomen is soft.     Tenderness: There is no abdominal tenderness.  Musculoskeletal:        General: No swelling.     Cervical back: Neck  supple.       Back:  Skin:    General: Skin is warm and dry.     Capillary Refill: Capillary refill  takes less than 2 seconds.  Neurological:     Mental Status: She is alert.  Psychiatric:        Mood and Affect: Mood normal.      UC Treatments / Results  Labs (all labs ordered are listed, but only abnormal results are displayed) Labs Reviewed  POCT URINE DIPSTICK - Abnormal; Notable for the following components:      Result Value   Clarity, UA cloudy (*)    Blood, UA trace-intact (*)    POC PROTEIN,UA =30 (*)    Nitrite, UA Positive (*)    All other components within normal limits  POCT URINE PREGNANCY - Normal    EKG   Radiology No results found.  Procedures Procedures (including critical care time)  Medications Ordered in UC Medications  ketorolac  (TORADOL ) 30 MG/ML injection 30 mg (30 mg Intramuscular Given 10/01/24 1039)    Initial Impression / Assessment and Plan / UC Course  I have reviewed the triage vital signs and the nursing notes.  Pertinent labs & imaging results that were available during my care of the patient were reviewed by me and considered in my medical decision making (see chart for details).     Acute cystitis with hematuria  Urinary frequency  Acute bilateral low back pain without sciatica   Urinalysis done today does show a urinary tract infection.  This can cause the lower back pain.  We will treat the lower back pain with a dose of Toradol  here in the office and a low-dose muscle relaxer to help improve symptoms but ultimately treatment of the urinary tract infection should relieve the lower back pain.  We will treat with the following: Toradol  injection given today. This is a medication to help with pain. This is not a narcotic.  Macrobid  100 mg twice daily for 5 days. This is an antibiotic. Flexeril  5 mg every 8 hours as needed for muscle spasms.  Use caution as this medication can cause drowsiness.  Make sure to stay hydrated by drinking plenty of water. Return to urgent care or PCP if symptoms worsen or fail to resolve.    Final  Clinical Impressions(s) / UC Diagnoses   Final diagnoses:  Acute cystitis with hematuria  Urinary frequency  Acute bilateral low back pain without sciatica     Discharge Instructions      Urinalysis done today does show a urinary tract infection.  This can cause the lower back pain.  We will treat the lower back pain with a dose of Toradol  here in the office and a low-dose muscle relaxer to help improve symptoms but ultimately treatment of the urinary tract infection should relieve the lower back pain.  We will treat with the following: Toradol  injection given today. This is a medication to help with pain. This is not a narcotic.  Macrobid  100 mg twice daily for 5 days. This is an antibiotic. Flexeril  5 mg every 8 hours as needed for muscle spasms.  Use caution as this medication can cause drowsiness.  Make sure to stay hydrated by drinking plenty of water. Return to urgent care or PCP if symptoms worsen or fail to resolve.       ED Prescriptions     Medication Sig Dispense Auth. Provider   nitrofurantoin , macrocrystal-monohydrate, (MACROBID ) 100 MG capsule Take 1 capsule (100  mg total) by mouth 2 (two) times daily. 10 capsule Challis Crill A, PA-C   cyclobenzaprine  (FLEXERIL ) 5 MG tablet Take 1 tablet (5 mg total) by mouth every 8 (eight) hours as needed for muscle spasms. 30 tablet Teresa Almarie LABOR, [place]      PDMP not reviewed this encounter.   Teresa Almarie LABOR, [place] 10/01/24 1040

## 2024-10-01 NOTE — Discharge Instructions (Addendum)
 Urinalysis done today does show a urinary tract infection.  This can cause the lower back pain.  We will treat the lower back pain with a dose of Toradol  here in the office and a low-dose muscle relaxer to help improve symptoms but ultimately treatment of the urinary tract infection should relieve the lower back pain.  We will treat with the following: Toradol  injection given today. This is a medication to help with pain. This is not a narcotic.  Macrobid  100 mg twice daily for 5 days. This is an antibiotic. Flexeril  5 mg every 8 hours as needed for muscle spasms.  Use caution as this medication can cause drowsiness.  Make sure to stay hydrated by drinking plenty of water. Return to urgent care or PCP if symptoms worsen or fail to resolve.

## 2024-10-04 ENCOUNTER — Ambulatory Visit (HOSPITAL_COMMUNITY): Admitting: Licensed Clinical Social Worker

## 2024-10-31 ENCOUNTER — Ambulatory Visit (HOSPITAL_COMMUNITY): Admitting: Licensed Clinical Social Worker

## 2024-10-31 DIAGNOSIS — F4321 Adjustment disorder with depressed mood: Secondary | ICD-10-CM | POA: Diagnosis not present

## 2024-10-31 DIAGNOSIS — F4323 Adjustment disorder with mixed anxiety and depressed mood: Secondary | ICD-10-CM

## 2024-10-31 NOTE — Progress Notes (Signed)
 THERAPIST PROGRESS NOTE   Session Date: 10/31/2024  Session Time: 1510 - 1602  Virtual Visit via Video Note  I connected with [name] on 10/31/2024 at  3:00 PM EST by a video enabled telemedicine application and verified that I am speaking with the correct person using two identifiers.  Location: Patient: Home Provider: Home Office   I discussed the limitations of evaluation and management by telemedicine and the availability of in person appointments. The patient expressed understanding and agreed to proceed.   I discussed the assessment and treatment plan with the patient. The patient was provided an opportunity to ask questions and all were answered. The patient agreed with the plan and demonstrated an understanding of the instructions.   The patient was advised to call back or seek an in-person evaluation if the symptoms worsen or if the condition fails to improve as anticipated.  I provided 52 minutes of non-face-to-face time during this encounter.  Participation Level: Active  MSE/Presentation: Behavior: Appropriate and Sharing Speech: Normal Thought Process: Coherent and Relevant Cognition: Alert and Appropriate Mood: Euthymic Affect: Congruent Insight: Improving Appearance: Casual  Type of Therapy: Individual Therapy  Treatment Goals addressed:   Initial (7) STG: [name] will reduce frequency of avoidant behaviors by 50% as evidenced by self-report in therapy sessions (OP Depression) LTG: Get back to the person I used to be, not being so sad, not being so anxious. (Anxiety) LTG: Reduce frequency, intensity, and duration of depression symptoms so that daily functioning is improved (OP Depression) LTG: Increase coping skills to manage depression and improve ability to perform daily activities (OP Depression) STG: Reduce overall depression score by a minimum of 25% on the Patient Health Questionnaire (PHQ-9) (OP Depression) STG: [name] will identify  cognitive patterns and beliefs that support depression (OP Depression)   Progress Towards Goals: Progressing  Interventions: CBT, Motivational Interviewing, Solution Focused, and Supportive  Summary: [name] is a(n) 44 y.o. female, with psych hx consistent with Adjustment disorder with mixed anxiety and depressed mood, and grief, presenting for follow-up therapy session in efforts to improve management of symptoms.   Patient actively engaged in session, presenting in pleasant moods and congruent affect throughout duration of visit.   Patient openly engaged in the introductory check-in, reporting that she has been doing Good, further recounting events of the past six weeks since previous visit, noting of holidays having been enjoyable, noting of  self realizing that holidays were pleasant versus what she had anticipated being and sad and depressing experience, being the first holiday season following son's father's passing.  Actively engaged in reassessment of depressive and anxious symptoms using the PHQ-9 and GAD-7. Explored the ongoing reduction in both depression and anxiety scores, noting of continued improvements in moods.   Engaged in further processing of thoughts surrounding anticipation of Christmas holiday being difficult for son, and expecting to experience challenging emotions, identifying behavioral activation efforts in managing improvements of moods. Pt shared of son having gotten into trouble at school prior to school letting out for the holiday's, noting of son's continued challenges navigating feelings surrounding loss of father and not wanting to discuss feelings surrounding the matter. Further processed pt's efforts to aid son in connecting with therapist, however noting of having not seen provider in approx. 2 months and not expressing the continued need to see provider again. Engaged in processing of potential factors that prove to impact son and the increased  difficulties he may prove to be experiencing in navigating various feelings  and emotions.     10/31/2024    3:18 PM 09/20/2024    4:12 PM 09/06/2024    4:22 PM 08/24/2024    4:11 PM  GAD 7 : Generalized Anxiety Score  Nervous, Anxious, on Edge 0 0 1 1  Control/stop worrying 1 1 1 3   Worry too much - different things 1 1 0 1  Trouble relaxing 0 1 1 1   Restless 0 1 0 0  Easily annoyed or irritable 0 0 0 0  Afraid - awful might happen 0 0 0 1  Total GAD 7 Score 2 4 3 7   Anxiety Difficulty Not difficult at all Not difficult at all Not difficult at all Not difficult at all      10/31/2024    3:20 PM 09/20/2024    4:15 PM 09/06/2024    4:25 PM 08/24/2024    4:14 PM 08/02/2024    8:14 AM  Depression screen PHQ 2/9  Decreased Interest 0 0 0 0 1  Down, Depressed, Hopeless 0 0 0 0 3  PHQ - 2 Score 0 0 0 0 4  Altered sleeping 0 1 3 1 3   Tired, decreased energy 0 1 1 3 1   Change in appetite 0 0 0 0 0  Feeling bad or failure about yourself  0 0 0 0 0  Trouble concentrating 0 0 0 1 3  Moving slowly or fidgety/restless 0 0 0 0 1  Suicidal thoughts 0 0 0 0 0  PHQ-9 Score 0 2 4 5  12    Difficult doing work/chores  Not difficult at all Not difficult at all Not difficult at all Not difficult at all     Data saved with a previous flowsheet row definition   Flowsheet Row UC from 10/01/2024 in [facility] UC from 07/30/2024 in [facility]) UC from 05/15/2024 in [facility])  C-SSRS RISK CATEGORY No Risk No Risk No Risk    Suicidal/Homicidal: None, No plan to harm self or others  Therapist Response:  Clinician actively greeted pt upon presenting for visit, assessing presenting moods and affect, engaging in check-in. Utilized open ended questions in eliciting recounts of events of the past 6 weeks, exploring holiday events and experienced emotions throughout holiday period.  Actively listened to pt's recounts of events, providing support and validation of identified progressions in managing emotional responses to stressors.  Engage patient in review of symptoms experienced over recent weeks via PHQ-9 and GAD-7, further processing decreased symptoms. Utilized CBT, Motivational Interviewing, solution-focused strategies, and supportive reflection in exploring presenting challenges, reinforcing progress, and identifying strategies for continued symptom management. Clinician reassessed severity of presenting sxs, and presence of any safety concerns.  [x]  Cognitive Challenging []  Cognitive Refocusing []  Cognitive Reframing  []  Communication Skills []  Compliance Issues []  DBT [x]  Exploration of Coping Patterns [x]  Exploration of Emotions []  Exploration of Relationship Patterns []  Guided Imagery []  Interactive Feedback []  Interpersonal Resolutions []  Mindfulness Training []  Preventative Services [x]  Psycho-Education  []  Relaxation/Deep Breathing []  Review of Treatment Plan/Progress []  Role-Play/Behavioral Rehearsal  []  Structured Problem Solving [x]  Supportive Reflection [x]  Symptom Management  []  Other   Patient responded well to interventions. Patient continues to meet criteria for Adjustment disorder with mixed anxiety and depressed mood, and Grief. Patient will continue to benefit from engagement in outpatient therapy due to being the least restrictive service to meet presenting needs.  Homework: None.  Plan: Return again in 3 weeks.  Diagnosis:  Encounter Diagnoses  Name Primary?   Adjustment disorder with mixed anxiety and depressed mood Yes   Grief     Collaboration of Care: Other none necessary at this time.  Patient/Guardian was advised Release of Information must be obtained prior to any record release in order to collaborate their care with an outside provider. Patient/Guardian was advised if they have not already done so to contact the registration  department to sign all necessary forms in order for us  to release information regarding their care.   Consent: Patient/Guardian gives verbal consent for treatment and assignment of benefits for services provided during this visit. Patient/Guardian expressed understanding and agreed to proceed.   [name], MSW, LCSW 10/31/2024,  3:13 PM

## 2024-11-02 ENCOUNTER — Ambulatory Visit: Payer: Self-pay

## 2024-11-02 ENCOUNTER — Ambulatory Visit (INDEPENDENT_AMBULATORY_CARE_PROVIDER_SITE_OTHER)

## 2024-11-02 ENCOUNTER — Ambulatory Visit

## 2024-11-02 ENCOUNTER — Other Ambulatory Visit (HOSPITAL_COMMUNITY)
Admission: RE | Admit: 2024-11-02 | Discharge: 2024-11-02 | Disposition: A | Source: Ambulatory Visit | Attending: Family | Admitting: Family

## 2024-11-02 VITALS — BP 109/72 | HR 73 | Temp 98.2°F | Resp 16 | Ht 68.0 in | Wt 194.6 lb

## 2024-11-02 DIAGNOSIS — N898 Other specified noninflammatory disorders of vagina: Secondary | ICD-10-CM | POA: Insufficient documentation

## 2024-11-02 DIAGNOSIS — B379 Candidiasis, unspecified: Secondary | ICD-10-CM | POA: Diagnosis not present

## 2024-11-02 DIAGNOSIS — K5909 Other constipation: Secondary | ICD-10-CM

## 2024-11-02 MED ORDER — POLYETHYLENE GLYCOL 3350 17 GM/SCOOP PO POWD: 17.0000 g | Freq: Every day | ORAL | 1 refills | Status: AC

## 2024-11-02 MED ORDER — METRONIDAZOLE 500 MG PO TABS: 500.0000 mg | ORAL_TABLET | Freq: Two times a day (BID) | ORAL | 0 refills | Status: DC

## 2024-11-02 MED ORDER — FLUCONAZOLE 150 MG PO TABS: 150.0000 mg | ORAL_TABLET | Freq: Once | ORAL | 0 refills | Status: DC

## 2024-11-02 NOTE — Progress Notes (Unsigned)
" ° ° ° °  Patient ID: [name], female    DOB: 11-Nov-1980  MRN: [id number]  CC: Medical Management of Chronic Issues (Patient is concern about stomach pains on both sides. Patient said that pain is wort than contraction 2/3 times a month )   Subjective: [name] is a 44 y.o. female with past medical history of *** who presents to clinic for Sharp stabbing, 3-4 months a month. Not currently. 5-10 minutes. Last one lasted 30 minutes. 2-3 times a week.  fevers Nausea, often   Foul smelling vaginal discharge, no sexual activity for the past 2 months. LMP: 01/04 Allergies[1]  ROS: Review of Systems Negative except as stated above  PHYSICAL EXAM: BP 109/72   Pulse 73   Temp 98.2 F (36.8 C) (Oral)   Resp 16   Ht 5' 8 (1.727 m)   Wt 194 lb 9.6 oz (88.3 kg)   SpO2 98%   BMI 29.59 kg/m   Physical Exam  General: well-appearing, no acute distress Skin: no jaundice, rashes, or lesions Cardiovascular: regular heart rate and rhythm, normal S1/S2, no murmurs, gallops, or rubs, peripheral pulses 2+ bilaterally Chest: no skeletal deformity, lungs clear to auscultation bilaterally, equal breath sounds bilaterally Abdomen: soft, non-distended, non-tender to palpation, no hepatomegaly, no splenomegaly, normoactive bowel sounds Musculoskeletal: normal gait Extremities: no peripheral edema  ASSESSMENT AND PLAN:  1. Other constipation (Primary) ***  2. Vaginal odor *** - Cervicovaginal ancillary only    Patient was given the opportunity to ask questions.  Patient verbalized understanding of the plan and was able to repeat key elements of the plan.    Orders Placed This Encounter  Procedures   DG Abd 1 View     Requested Prescriptions   Signed Prescriptions Disp Refills   polyethylene glycol powder (GLYCOLAX /MIRALAX ) 17 GM/SCOOP powder 850 g 1    Sig: Take 17 g by mouth daily. Dissolve 1 capful (17g) in 4-8 ounces of liquid and take by mouth daily.    metroNIDAZOLE  (FLAGYL ) 500 MG tablet 14 tablet 0    Sig: Take 1 tablet (500 mg total) by mouth 2 (two) times daily for 7 days.   fluconazole  (DIFLUCAN ) 150 MG tablet 1 tablet 0    Sig: Take 1 tablet (150 mg total) by mouth once for 1 dose.    No follow-ups on file.  [name], PA-C      [1] No Known Allergies  "

## 2024-11-03 ENCOUNTER — Ambulatory Visit: Admitting: Family

## 2024-11-03 LAB — CERVICOVAGINAL ANCILLARY ONLY
Bacterial Vaginitis (gardnerella): NEGATIVE
Candida Glabrata: POSITIVE — AB
Candida Vaginitis: NEGATIVE
Chlamydia: NEGATIVE
Comment: NEGATIVE
Comment: NEGATIVE
Comment: NEGATIVE
Comment: NEGATIVE
Comment: NEGATIVE
Comment: NORMAL
Neisseria Gonorrhea: NEGATIVE
Trichomonas: NEGATIVE

## 2024-11-03 MED ORDER — BORIC ACID VAGINAL 600 MG VA SUPP: 600.0000 mg | Freq: Every evening | VAGINAL | 0 refills | Status: AC

## 2024-11-13 ENCOUNTER — Ambulatory Visit (HOSPITAL_COMMUNITY): Admitting: Licensed Clinical Social Worker

## 2024-11-21 ENCOUNTER — Encounter (HOSPITAL_COMMUNITY): Payer: Self-pay

## 2024-11-21 ENCOUNTER — Ambulatory Visit (HOSPITAL_COMMUNITY): Admitting: Licensed Clinical Social Worker

## 2024-12-08 ENCOUNTER — Ambulatory Visit: Admitting: Family

## 2024-12-18 ENCOUNTER — Ambulatory Visit (HOSPITAL_COMMUNITY): Admitting: Licensed Clinical Social Worker
